# Patient Record
Sex: Female | Born: 2000 | Race: White | Hispanic: No | Marital: Single | Smoking: Never smoker
Health system: Southern US, Community
[De-identification: ages and names within clinical notes are randomized; demographics above are authoritative.]

## PROBLEM LIST (undated history)

## (undated) ENCOUNTER — Emergency Department: Payer: BLUE CROSS/BLUE SHIELD

## (undated) DIAGNOSIS — Z789 Other specified health status: Secondary | ICD-10-CM

## (undated) HISTORY — PX: APPENDECTOMY: SHX54

## (undated) HISTORY — DX: Other specified health status: Z78.9

---
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
## 2021-01-06 ENCOUNTER — Telehealth (INDEPENDENT_AMBULATORY_CARE_PROVIDER_SITE_OTHER): Payer: 59 | Admitting: Psychology

## 2021-01-06 DIAGNOSIS — F5089 Other specified eating disorder: Secondary | ICD-10-CM | POA: Diagnosis not present

## 2021-01-06 DIAGNOSIS — F419 Anxiety disorder, unspecified: Secondary | ICD-10-CM | POA: Diagnosis not present

## 2021-01-08 NOTE — Progress Notes (Signed)
Chief Complaint:   OBESITY Molly Cherry is here to discuss her progress with her obesity treatment plan along with follow-up of her obesity related diagnoses.   Today's visit was #: 4 Starting weight: 277 lbs Starting date: 11/12/2020 Today's weight: 272 lbs Today's date: 12/29/2020 Weight change since last visit: +2 lbs Total lbs lost to date: 5 lbs Body mass index is 46.69 kg/m.  Total weight loss percentage to date: -1.81%  Interim History:  Molly Cherry had a 4th of July party and [place] [employer] [place] [place]  She and her mom already got rid of the unhealthy foods from the party and are getting back on plan today.  Plan:  Add [employer] Chips for salty/crunchy snack.  Mindful eating handout given.  Current Meal Plan: the Category 3 Plan for 70% of the time.  Current Exercise Plan: Walking for 20 minutes 3 times per week.  Assessment/Plan:   Medications Discontinued During This Encounter  Medication Reason   Vitamin D, Ergocalciferol, (DRISDOL) 1.25 MG (50000 UNIT) CAPS capsule Reorder   Meds ordered this encounter  Medications   Vitamin D, Ergocalciferol, (DRISDOL) 1.25 MG (50000 UNIT) CAPS capsule    Sig: Take 1 capsule (50,000 Units total) by mouth every 7 (seven) days.    Dispense:  4 capsule    Refill:  0   1. Insulin resistance Not at goal. Goal is HgbA1c < 5.7, fasting insulin closer to 5.  Medication: None.  When eating on plan, she has less cravings for carbs.  Plan:  Can consider medication in the future.  No need currently and no desire by patient to be on medication.  She will continue to focus on protein-rich, low simple carbohydrate foods. We reviewed the importance of hydration, regular exercise for stress reduction, and restorative sleep.   Lab Results  Component Value Date   HGBA1C 5.4 11/12/2020   Lab Results  Component Value Date   INSULIN 18.4 11/12/2020   2. Vitamin D deficiency Not at goal.  She is taking vitamin D 50,000 IU weekly.   Medication compliance good.  Tolerating well with no side effects.  Plan: - Reiterated importance of vitamin D (as well as calcium) to their health and wellbeing.  - Reminded Molly Cherry that weight loss will likely improve availability of vitamin D, thus encouraged her to continue with meal plan and their weight loss efforts to further improve this condition. - I recommend patient continue to take weekly prescription vit D 50,000 IU - Informed patient this may be a lifelong thing, and she was encouraged to continue to take the medicine until told otherwise.   - we will need to monitor levels regularly (every 3-4 mo on average) to keep levels within normal limits.  - weight loss will likely improve availability of vitamin D, thus encouraged Molly Cherry to continue with meal plan and their weight loss efforts to further improve this condition - pt's questions and concerns regarding this condition addressed.  Lab Results  Component Value Date   VD25OH 20.8 (L) 11/12/2020   - Refill Vitamin D, Ergocalciferol, (DRISDOL) 1.25 MG (50000 UNIT) CAPS capsule; Take 1 capsule (50,000 Units total) by mouth every 7 (seven) days.  Dispense: 4 capsule; Refill: 0  3. Other disorder of eating, with emotional eating Not at goal. Medication: None.  Saw Molly Cherry and it went well and she found it helpful.  Tips/tricks to minimize her emotional eating habits were discussed.  GAD well controlled lately.  Plan:  Continue with Molly Cherry.  Continue with emotional eating strategies.  Mindful eating discussed with patient and handouts given.  4. At risk for activity intolerance Molly Cherry was given approximately 8 minutes of counseling today regarding her increased risk for exercise intolerance.  We discussed patient's specific personal and medical issues that raise our concern.  She was advised of strategies to prevent injury and ways to improve her cardiopulmonary fitness levels slowly over time.  We additionally  discussed various fitness trackers and smart phone apps to help motivate the patient to stay on track.    5. Class 3 severe obesity with serious comorbidity and body mass index (BMI) of 45.0 to 49.9 in adult, unspecified obesity type ([name])  Course: Molly Cherry is currently in the action stage of change. As such, her goal is to continue with weight loss efforts.   Nutrition goals: She has agreed to the Category 3 Plan.   Exercise goals:  Walking 5 days per week for 20 minutes.  Behavioral modification strategies: increasing lean protein intake, decreasing simple carbohydrates, and planning for success.  Molly Cherry has agreed to follow-up with our clinic in 2 weeks. She was informed of the importance of frequent follow-up visits to maximize her success with intensive lifestyle modifications for her multiple health conditions.   Objective:   Blood pressure 111/77, pulse 74, temperature 98.4 F (36.9 C), height 5\' 4"  (1.626 m), weight 272 lb (123.4 kg), SpO2 98 %. Body mass index is 46.69 kg/m.  General: Cooperative, alert, well developed, in no acute distress. HEENT: Conjunctivae and lids unremarkable. Cardiovascular: Regular rhythm.  Lungs: Normal work of breathing. Neurologic: No focal deficits.   Lab Results  Component Value Date   CREATININE 0.71 11/12/2020   BUN 15 11/12/2020   NA 139 11/12/2020   K 4.7 11/12/2020   CL 103 11/12/2020   CO2 21 11/12/2020   Lab Results  Component Value Date   ALT 17 11/12/2020   AST 19 11/12/2020   ALKPHOS 102 11/12/2020   BILITOT 0.3 11/12/2020   Lab Results  Component Value Date   HGBA1C 5.4 11/12/2020   Lab Results  Component Value Date   INSULIN 18.4 11/12/2020   Lab Results  Component Value Date   TSH 2.150 11/12/2020   Lab Results  Component Value Date   CHOL 177 (H) 11/12/2020   HDL 45 11/12/2020   LDLCALC 113 (H) 11/12/2020   TRIG 103 (H) 11/12/2020   CHOLHDL 3.9 11/12/2020   Lab Results  Component Value Date    VD25OH 20.8 (L) 11/12/2020   Lab Results  Component Value Date   WBC 8.4 11/12/2020   HGB 12.4 11/12/2020   HCT 38.1 11/12/2020   MCV 85 11/12/2020   PLT 269 11/12/2020   Attestation Statements:   Reviewed by clinician on day of visit: allergies, medications, problem list, medical history, surgical history, family history, social history, and previous encounter notes.  I, 11/14/2020, CMA, am acting as Insurance claims handler for Energy manager, DO.  I have reviewed the above documentation for accuracy and completeness, and I agree with the above. [employer], D.O.  The 21st Century Cures Act was signed into law in 2016 which includes the topic of electronic health records.  This provides immediate access to information in MyChart.  This includes consultation notes, operative notes, office notes, lab results and pathology reports.  If you have any questions about what you read please let 2017 know at your next visit so we  can discuss your concerns and take corrective action if need be.  We are right here with you.

## 2021-01-12 ENCOUNTER — Encounter (INDEPENDENT_AMBULATORY_CARE_PROVIDER_SITE_OTHER): Payer: Self-pay | Admitting: Family Medicine

## 2021-01-12 ENCOUNTER — Other Ambulatory Visit: Payer: Self-pay

## 2021-01-12 ENCOUNTER — Ambulatory Visit (INDEPENDENT_AMBULATORY_CARE_PROVIDER_SITE_OTHER): Payer: 59 | Admitting: Family Medicine

## 2021-01-12 VITALS — BP 127/85 | HR 80 | Temp 97.7°F | Ht 64.0 in | Wt 270.0 lb

## 2021-01-12 DIAGNOSIS — E559 Vitamin D deficiency, unspecified: Secondary | ICD-10-CM | POA: Diagnosis not present

## 2021-01-12 DIAGNOSIS — Z9189 Other specified personal risk factors, not elsewhere classified: Secondary | ICD-10-CM

## 2021-01-12 DIAGNOSIS — E8881 Metabolic syndrome: Secondary | ICD-10-CM

## 2021-01-12 DIAGNOSIS — Z6841 Body Mass Index (BMI) 40.0 and over, adult: Secondary | ICD-10-CM

## 2021-01-13 NOTE — Progress Notes (Signed)
Office: [phone]  /  Fax: [phone]    Date: January 27, 2021   Appointment Start Time: 12:01pm Duration: 23 minutes Provider: Lawerance Cruel, Psy.D. Type of Session: Individual Therapy  Location of Patient: Parked in car at work Location of Provider: Provider's home (private office) Type of Contact: Telepsychological Visit via MyChart Video Visit  Session Content: Molly Cherry is a 20 y.o. female presenting for a follow-up appointment to address the previously established treatment goal of increasing coping skills. Today's appointment was a telepsychological visit due to COVID-19. Molly Cherry provided verbal consent for today's telepsychological appointment and she is aware she is responsible for securing confidentiality on her end of the session. Prior to proceeding with today's appointment, Molly Cherry's physical location at the time of this appointment was obtained as well a phone number she could be reached at in the event of technical difficulties. Molly Cherry and this provider participated in today's telepsychological service. Of note, today's appointment was switched to a regular telephone call at 12:17pm with Molly Cherry's verbal consent due to [facility] phone overheating.   This provider conducted a brief check-in. Molly Cherry shared she is moving next week to [place], [place]. She noted, "She is excited and nervous at the same time." Associated thoughts and feelings were processed. Regarding eating, Molly Cherry indicated she was doing well on her structured meal plan the past two weeks, noting Sunday "was really bad." This was further explored. Reviewed all or nothing thinking. Notably, Molly Cherry discussed engaging in meal prep yesterday and is back on track. Positive reinforcement was provided.   Molly Cherry was observed becoming tearful when recollecting recent events, including leaving her current job. Associated thoughts and feelings were processed. Psychoeducation regarding mindfulness was provided. A  handout was provided to [facility] with further information regarding mindfulness, including exercises. This provider also explained the benefit of mindfulness as it relates to emotional eating. Molly Cherry was encouraged to engage in the provided exercises between now and the next appointment with this provider. Molly Cherry agreed. During today's appointment, Molly Cherry was led through a mindfulness exercise involving her senses.Molly Cherry provided verbal consent during today's appointment for this provider to send a handout about mindfulness via e-mail. Overall, Molly Cherry was receptive to today's appointment as evidenced by openness to sharing, responsiveness to feedback, and willingness to engage in mindfulness exercises to assist with coping.  Mental Status Examination:  Appearance: well groomed and appropriate hygiene  Behavior: appropriate to circumstances Mood: euthymic; sad when discussing leaving her job Affect: mood congruent; tearful when discussing leaving her job Speech: normal in rate, volume, and tone Eye Contact: appropriate Psychomotor Activity: unable to assess Gait: unable to assess Thought Process: linear, logical, and goal directed  Thought Content/Perception: no hallucinations, delusions, bizarre thinking or behavior reported or observed and no evidence or endorsement of suicidal and homicidal ideation, plan, and intent Orientation: time, person, place, and purpose of appointment Memory/Concentration: memory, attention, language, and fund of knowledge intact  Insight/Judgment: fair  Interventions:  Conducted a brief chart review Provided empathic reflections and validation Processed thoughts and feelings Psychoeducation provided regarding mindfulness Engaged patient in a mindfulness exercise Employed supportive psychotherapy interventions to facilitate reduced distress, and to improve coping skills with identified stressors Provided positive reinforcement   DSM-5 Diagnosis(es):  F50.89 Other Specified Feeding or Eating Disorder, Emotional Eating Behaviors and F41.9 Unspecified Anxiety Disorder  Treatment Goal & Progress: During the initial appointment with this provider, the following treatment goal was established: increase coping skills. Sheyli has demonstrated progress in her goal as evidenced by increased awareness of hunger  patterns and increased awareness of triggers for emotional eating behaviors. Aariana also continues to demonstrate willingness to engage in learned skill(s).  Plan: The next appointment will be scheduled in three weeks, which will be via MyChart Video Visit. The next session will focus on working towards the established treatment goal.

## 2021-01-16 NOTE — Progress Notes (Signed)
Chief Complaint:   OBESITY Temple is here to discuss her progress with her obesity treatment plan along with follow-up of her obesity related diagnoses. Jarah is on the Category 3 Plan and states she is following her eating plan approximately 60% of the time. Alya states she has not been exercising.  Today's visit was #: 5 Starting weight: 277 lbs Starting date: 11/12/2020 Today's weight: 270 lbs Today's date: 01/12/2021 Total lbs lost to date: 7 Total lbs lost since last in-office visit: 2  Interim History: Brentney is moving August 18th back to [place]. She will need Monday morning appointments every 2 to 3 weeks. She is going to school online, though she is working 20 to 30 hours a week. Akiva has a gym at her apartment complex and plans on using that and the pool.  Subjective:   1. Vitamin D deficiency She is currently taking prescription vitamin D 50,000 IU each week. She denies nausea, vomiting or muscle weakness.  Lab Results  Component Value Date   VD25OH 20.8 (L) 11/12/2020   2. Insulin resistance Jeanmarie denies carb cravings currently. She has a diagnosis of insulin resistance based on her elevated fasting insulin level >5. She continues to work on diet and exercise to decrease her risk of diabetes.  Lab Results  Component Value Date   INSULIN 18.4 11/12/2020   Lab Results  Component Value Date   HGBA1C 5.4 11/12/2020   3. At risk for diabetes mellitus Ashby is at higher than average risk for developing diabetes due to insulin resistance and obesity.    Assessment/Plan:  No orders of the defined types were placed in this encounter.   There are no discontinued medications.   No orders of the defined types were placed in this encounter.    1. Vitamin D deficiency Low Vitamin D level contributes to fatigue and are associated with obesity, breast, and colon cancer. She agrees to continue to take prescription Vitamin D @50 ,000 IU every week with  no refill needed and will follow-up for routine testing of Vitamin D, at least 2-3 times per year to avoid over-replacement.  2. Insulin resistance Murphy was given a handout on Insulin Resistance and Metformin again. She will consider in the future is she develops carb cravings. She will continue to work on weight loss, exercise, and decreasing simple carbohydrates to help decrease the risk of diabetes. Barbee agreed to follow-up with as directed to closely monitor her progress.  3. At risk for diabetes mellitus Kania was given approximately 15 minutes of diabetes education and counseling today. We discussed intensive lifestyle modifications today with an emphasis on weight loss as well as increasing exercise and decreasing simple carbohydrates in her diet. We also reviewed medication options with an emphasis on risk versus benefit of those discussed.   Repetitive spaced learning was employed today to elicit superior memory formation and behavioral change.   4. Obesity with current BMI of 46.4 Jochebed is currently in the action stage of change. As such, her goal is to continue with weight loss efforts. She has agreed to the Category 3 Plan.   Exercise goals:  Start walking  Behavioral modification strategies: increasing lean protein intake and decreasing simple carbohydrates.  Evangelia has agreed to follow-up with our clinic in 2 weeks. She was informed of the importance of frequent follow-up visits to maximize her success with intensive lifestyle modifications for her multiple health conditions.   Objective:   Blood pressure 127/85, pulse 80, temperature  97.7 F (36.5 C), height 5\' 4"  (1.626 m), weight 270 lb (122.5 kg), SpO2 98 %. Body mass index is 46.35 kg/m.  General: Cooperative, alert, well developed, in no acute distress. HEENT: Conjunctivae and lids unremarkable. Cardiovascular: Regular rhythm.  Lungs: Normal work of breathing. Neurologic: No focal deficits.   Lab  Results  Component Value Date   CREATININE 0.71 11/12/2020   BUN 15 11/12/2020   NA 139 11/12/2020   K 4.7 11/12/2020   CL 103 11/12/2020   CO2 21 11/12/2020   Lab Results  Component Value Date   ALT 17 11/12/2020   AST 19 11/12/2020   ALKPHOS 102 11/12/2020   BILITOT 0.3 11/12/2020   Lab Results  Component Value Date   HGBA1C 5.4 11/12/2020   Lab Results  Component Value Date   INSULIN 18.4 11/12/2020   Lab Results  Component Value Date   TSH 2.150 11/12/2020   Lab Results  Component Value Date   CHOL 177 (H) 11/12/2020   HDL 45 11/12/2020   LDLCALC 113 (H) 11/12/2020   TRIG 103 (H) 11/12/2020   CHOLHDL 3.9 11/12/2020   Lab Results  Component Value Date   VD25OH 20.8 (L) 11/12/2020   Lab Results  Component Value Date   WBC 8.4 11/12/2020   HGB 12.4 11/12/2020   HCT 38.1 11/12/2020   MCV 85 11/12/2020   PLT 269 11/12/2020   No results found for: IRON, TIBC, FERRITIN  Attestation Statements:   Reviewed by clinician on day of visit: allergies, medications, problem list, medical history, surgical history, family history, social history, and previous encounter notes.  I05/27/2022, CMA, am acting as transcriptionist for Kirke Corin, DO  I have reviewed the above documentation for accuracy and completeness, and I agree with the above. [employer], D.O.  The 21st Century Cures Act was signed into law in 2016 which includes the topic of electronic health records.  This provides immediate access to information in MyChart.  This includes consultation notes, operative notes, office notes, lab results and pathology reports.  If you have any questions about what you read please let 2017 know at your next visit so we can discuss your concerns and take corrective action if need be.  We are right here with you.

## 2021-01-26 ENCOUNTER — Other Ambulatory Visit: Payer: Self-pay

## 2021-01-26 ENCOUNTER — Encounter (INDEPENDENT_AMBULATORY_CARE_PROVIDER_SITE_OTHER): Payer: Self-pay | Admitting: Family Medicine

## 2021-01-26 ENCOUNTER — Ambulatory Visit (INDEPENDENT_AMBULATORY_CARE_PROVIDER_SITE_OTHER): Payer: 59 | Admitting: Family Medicine

## 2021-01-26 VITALS — BP 120/84 | HR 67 | Temp 98.1°F | Ht 64.0 in | Wt 271.0 lb

## 2021-01-26 DIAGNOSIS — F3289 Other specified depressive episodes: Secondary | ICD-10-CM

## 2021-01-26 DIAGNOSIS — Z6841 Body Mass Index (BMI) 40.0 and over, adult: Secondary | ICD-10-CM

## 2021-01-26 DIAGNOSIS — E559 Vitamin D deficiency, unspecified: Secondary | ICD-10-CM

## 2021-01-26 DIAGNOSIS — Z9189 Other specified personal risk factors, not elsewhere classified: Secondary | ICD-10-CM

## 2021-01-26 MED ORDER — BUPROPION HCL ER (SR) 100 MG PO TB12: ORAL_TABLET | ORAL | 0 refills | Status: DC

## 2021-01-26 NOTE — Patient Instructions (Signed)
Health Maintenance Due  Topic Date Due   COVID-19 Vaccine (1) Never done   HPV VACCINES (1 - 2-dose series) Never done   HIV Screening  Never done   Hepatitis C Screening  Never done   TETANUS/TDAP  Never done   INFLUENZA VACCINE  01/19/2021    Depression screen [facility] 2/9 11/12/2020 11/04/2015  Decreased Interest 3 0  Down, Depressed, Hopeless 3 0  PHQ - 2 Score 6 0  Altered sleeping 1 -  Tired, decreased energy 3 -  Change in appetite 3 -  Feeling bad or failure about yourself  3 -  Trouble concentrating 3 -  Moving slowly or fidgety/restless 0 -  Suicidal thoughts 0 -  PHQ-9 Score 19 -  Difficult doing work/chores Somewhat difficult -

## 2021-01-27 ENCOUNTER — Telehealth (INDEPENDENT_AMBULATORY_CARE_PROVIDER_SITE_OTHER): Payer: 59 | Admitting: Psychology

## 2021-01-27 DIAGNOSIS — F419 Anxiety disorder, unspecified: Secondary | ICD-10-CM

## 2021-01-27 DIAGNOSIS — F5089 Other specified eating disorder: Secondary | ICD-10-CM

## 2021-01-27 NOTE — Progress Notes (Signed)
Chief Complaint:   OBESITY Molly Cherry is here to discuss her progress with her obesity treatment plan along with follow-up of her obesity related diagnoses. Alaa is on the Category 3 Plan and states she is following her eating plan approximately 75-80% of the time. Faatimah states she is not currently exercising.  Today's visit was #: 6 Starting weight: 277 lbs Starting date: 11/12/2020 Today's weight: 271 lbs Today's date: 01/26/2021 Total lbs lost to date: 6 Total lbs lost since last in-office visit: +1  Interim History: Jerline notes she did really well after 2 weeks, but overate yesterday due to stress.  Subjective:   1. 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 Pt denies suicidal or homicidal ideations. She is working with Dr. Dewaine Conger for emotional eating counseling.  Assessment/Plan:   1. Other depression with emotional eating Start low dose Wellbutrin. Behavior modification techniques were discussed today to help Jurni deal with her emotional/non-hunger eating behaviors.  Orders and follow up as documented in patient record.   Start- buPROPion ER (WELLBUTRIN SR) 100 MG 12 hr tablet; 1 po q am  Dispense: 30 tablet; Refill: 0  2. At risk for adverse drug reaction Due to [facility] current conditions and medications, she is at a higher risk for drug side effect.  At least 9 minutes was spent on counseling her about these concerns today.  We discussed the benefits and potential risks of these medications, and all of patient's concerns were addressed and questions were answered.  she will call us, or their PCP or other specialists who treat their conditions with medications, with any questions or concerns that may develop.    3. Obesity with current BMI of 46.6  Sharri is currently in the action stage of change. As such, her goal is to continue with weight loss efforts. She has agreed to the Category 3  Plan.   Do 10-15 minutes of distraction activities for emotional eating reviewed with pt.  Exercise goals:  As is  Behavioral modification strategies: increasing lean protein intake, decreasing simple carbohydrates, and planning for success.  Cassidee has agreed to follow-up with our clinic in 2-3 weeks. She was informed of the importance of frequent follow-up visits to maximize her success with intensive lifestyle modifications for her multiple health conditions.   Objective:   Blood pressure 120/84, pulse 67, temperature 98.1 F (36.7 C), height 5\' 4"  (1.626 m), weight 271 lb (122.9 kg), SpO2 100 %. Body mass index is 46.52 kg/m.  General: Cooperative, alert, well developed, in no acute distress. HEENT: Conjunctivae and lids unremarkable. Cardiovascular: Regular rhythm.  Lungs: Normal work of breathing. Neurologic: No focal deficits.   Lab Results  Component Value Date   CREATININE 0.71 11/12/2020   BUN 15 11/12/2020   NA 139 11/12/2020   K 4.7 11/12/2020   CL 103 11/12/2020   CO2 21 11/12/2020   Lab Results  Component Value Date   ALT 17 11/12/2020   AST 19 11/12/2020   ALKPHOS 102 11/12/2020   BILITOT 0.3 11/12/2020   Lab Results  Component Value Date   HGBA1C 5.4 11/12/2020   Lab Results  Component Value Date   INSULIN 18.4 11/12/2020   Lab Results  Component Value Date   TSH 2.150 11/12/2020   Lab Results  Component Value Date   CHOL 177 (H) 11/12/2020   HDL 45 11/12/2020   LDLCALC 113 (H) 11/12/2020   TRIG 103 (H) 11/12/2020  CHOLHDL 3.9 11/12/2020   Lab Results  Component Value Date   VD25OH 20.8 (L) 11/12/2020   Lab Results  Component Value Date   WBC 8.4 11/12/2020   HGB 12.4 11/12/2020   HCT 38.1 11/12/2020   MCV 85 11/12/2020   PLT 269 11/12/2020    Attestation Statements:   Reviewed by clinician on day of visit: allergies, medications, problem list, medical history, surgical history, family history, social history, and previous  encounter notes.  Edmund Hilda, CMA, am acting as transcriptionist for [employer], DO.  I have reviewed the above documentation for accuracy and completeness, and I agree with the above. [name], D.O.  The 21st Century Cures Act was signed into law in 2016 which includes the topic of electronic health records.  This provides immediate access to information in MyChart.  This includes consultation notes, operative notes, office notes, lab results and pathology reports.  If you have any questions about what you read please let us know at your next visit so we can discuss your concerns and take corrective action if need be.  We are right here with you.

## 2021-02-03 NOTE — Progress Notes (Signed)
Office: [phone]  /  Fax: [phone]    Date: February 17, 2021   Appointment Start Time: 2:05pm Duration: 19 minutes Provider: Lawerance Cruel, Psy.D. Type of Session: Individual Therapy  Location of Patient: Home (private location) Location of Provider: Provider's Home (private office) Type of Contact: Telepsychological Visit via MyChart Video Visit  Session Content:This provider called Molly Cherry at 2:03pm as she did not present for today's appointment. A HIPAA compliant voicemail was left requesting a call back. She was observed joining shortly after. As such, today's appointment was initiated 5 minutes late. Molly Cherry is a 20 y.o. female presenting for a follow-up appointment to address the previously established treatment goal of increasing coping skills. Today's appointment was a telepsychological visit due to COVID-19. Molly Cherry provided verbal consent for today's telepsychological appointment and she is aware she is responsible for securing confidentiality on her end of the session. Prior to proceeding with today's appointment, Molly Cherry's physical location at the time of this appointment was obtained as well a phone number she could be reached at in the event of technical difficulties. Molly Cherry and this provider participated in today's telepsychological service.   This provider conducted a brief check-in. Molly Cherry indicated she has been busy with moving. Regarding eating, she acknowledged skipping meals recently due to being busy. Associated thoughts and feelings were processed. She discussed challenges with breakfast and lunch. As such, psychoeducation regarding SMART goals was provided and Molly Cherry was engaged in goal setting. The following goal was established for lunch: Molly Cherry will eat lunch congruent to her structured meal plan at least 5 out of 7 days a week between now and the next appointment with this provider. Possible obstacles/barriers were explored. Molly Cherry of session focused  on mindfulness. Molly Cherry shared an instance of using the previously shared mindfulness exercise involving the senses, noting "it helped a lot." Positive reinforcement was provided. She was encouraged to engage in the shared exercises regularly. This provider also discussed the utilization of YouTube for mindfulness exercises (e.g., exercises by Rhae Hammock). Overall, Molly Cherry was receptive to today's appointment as evidenced by openness to sharing, responsiveness to feedback, and  willingness to work toward the established SMART goal .  Mental Status Examination:  Appearance: well groomed and appropriate hygiene  Behavior: appropriate to circumstances Mood: euthymic Affect: mood congruent Speech: normal in rate, volume, and tone Eye Contact: appropriate Psychomotor Activity: appropriate Gait: unable to assess Thought Process: linear, logical, and goal directed  Thought Content/Perception: no hallucinations, delusions, bizarre thinking or behavior reported or observed and no evidence or endorsement of suicidal and homicidal ideation, plan, and intent Orientation: time, person, place, and purpose of appointment Memory/Concentration: memory, attention, language, and fund of knowledge intact  Insight/Judgment: fair  Interventions:  Conducted a brief chart review Provided empathic reflections and validation Employed supportive psychotherapy interventions to facilitate reduced distress and to improve coping skills with identified stressors Engaged patient in goal setting Psychoeducation provided regarding SMART goals Reviewed content from the last appointment  Provided positive reinforcement   DSM-5 Diagnosis(es): F50.89 Other Specified Feeding or Eating Disorder, Emotional Eating Behaviors and F41.9 Unspecified Anxiety Disorder  Treatment Goal & Progress: During the initial appointment with this provider, the following treatment goal was established: increase coping skills. Molly Cherry has  demonstrated progress in her goal as evidenced by increased awareness of hunger patterns and increased awareness of triggers for emotional eating behaviors. Molly Cherry also continues to demonstrate willingness to engage in learned skill(s).  Plan: Based on appointment availability and Molly Cherry's work schedule, the next appointment will  be scheduled in three weeks, which will be via MyChart Video Visit. The next session will focus on working towards the established treatment goal.

## 2021-02-09 ENCOUNTER — Encounter (INDEPENDENT_AMBULATORY_CARE_PROVIDER_SITE_OTHER): Payer: Self-pay | Admitting: Family Medicine

## 2021-02-09 ENCOUNTER — Ambulatory Visit (INDEPENDENT_AMBULATORY_CARE_PROVIDER_SITE_OTHER): Payer: 59 | Admitting: Family Medicine

## 2021-02-09 ENCOUNTER — Other Ambulatory Visit: Payer: Self-pay

## 2021-02-09 VITALS — BP 122/8 | HR 58 | Temp 97.5°F | Ht 64.0 in | Wt 271.0 lb

## 2021-02-09 DIAGNOSIS — E66813 Obesity, class 3: Secondary | ICD-10-CM

## 2021-02-09 DIAGNOSIS — Z6841 Body Mass Index (BMI) 40.0 and over, adult: Secondary | ICD-10-CM

## 2021-02-09 DIAGNOSIS — F3289 Other specified depressive episodes: Secondary | ICD-10-CM

## 2021-02-09 DIAGNOSIS — E8881 Metabolic syndrome: Secondary | ICD-10-CM | POA: Diagnosis not present

## 2021-02-09 DIAGNOSIS — Z9189 Other specified personal risk factors, not elsewhere classified: Secondary | ICD-10-CM

## 2021-02-09 DIAGNOSIS — E559 Vitamin D deficiency, unspecified: Secondary | ICD-10-CM

## 2021-02-09 DIAGNOSIS — E88819 Insulin resistance, unspecified: Secondary | ICD-10-CM

## 2021-02-09 MED ORDER — BUPROPION HCL ER (SR) 100 MG PO TB12: ORAL_TABLET | ORAL | 0 refills | Status: DC

## 2021-02-09 MED ORDER — VITAMIN D (ERGOCALCIFEROL) 1.25 MG (50000 UNIT) PO CAPS: 50000.0000 [IU] | ORAL_CAPSULE | ORAL | 0 refills | Status: DC

## 2021-02-09 NOTE — Progress Notes (Signed)
Chief Complaint:   OBESITY Molly Cherry is here to discuss her progress with her obesity treatment plan along with follow-up of her obesity related diagnoses. Molly Cherry is on the Category 3 Plan and states she is following her eating plan approximately 80% of the time. Molly Cherry states she is not currently exercising.  Today's visit was #: 7 Starting weight: 277 lbs Starting date: 11/12/2020 Today's weight: 271 lbs Today's date: 02/09/2021 Total lbs lost to date: 6 Total lbs lost since last in-office visit: 0  Interim History: Molly Cherry moved into her apt over the weekend. For a couple of days, she didn't eat much all day and then ate poorly at night. She has a lot of steps at the new place and feels increased exercise helped.  Assessment/Plan:  No orders of the defined types were placed in this encounter.   Medications Discontinued During This Encounter  Medication Reason   Vitamin D, Ergocalciferol, (DRISDOL) 1.25 MG (50000 UNIT) CAPS capsule Reorder   buPROPion ER (WELLBUTRIN SR) 100 MG 12 hr tablet Reorder     Meds ordered this encounter  Medications   buPROPion ER (WELLBUTRIN SR) 100 MG 12 hr tablet    Sig: 1 po q am    Dispense:  30 tablet    Refill:  0    Ov for rf   Vitamin D, Ergocalciferol, (DRISDOL) 1.25 MG (50000 UNIT) CAPS capsule    Sig: Take 1 capsule (50,000 Units total) by mouth every 7 (seven) days.    Dispense:  4 capsule    Refill:  0     1. Insulin resistance At goal. Goal is HgbA1c < 5.7, fasting insulin closer to 5.  Medication: None.  Molly Cherry denies carb cravings and is doing well on no meds.   Plan:  She declines need for meds at this time. She will continue to focus on protein-rich, low simple carbohydrate foods. We reviewed the importance of hydration, regular exercise for stress reduction, and restorative sleep.   Lab Results  Component Value Date   HGBA1C 5.4 11/12/2020   Lab Results  Component Value Date   INSULIN 18.4 11/12/2020   2.  Vitamin D deficiency Not at goal.   Plan: Continue to take prescription Vitamin D @50 ,000 IU every week as prescribed.  Follow-up for routine testing of Vitamin D, at least 2-3 times per year to avoid over-replacement.  Lab Results  Component Value Date   VD25OH 20.8 (L) 11/12/2020   Refill- Vitamin D, Ergocalciferol, (DRISDOL) 1.25 MG (50000 UNIT) CAPS capsule; Take 1 capsule (50,000 Units total) by mouth every 7 (seven) days.  Dispense: 4 capsule; Refill: 0  3. Other depression with emotional eating Molly Cherry reports much less emotional eating, and she is sleeping well. Medication: Wellbutrin.   Plan:  In addition to continuing Wellbutrin, continue to increase activity daily and continue 10-15 minutes if distraction activity for emotional eating.  Refill- buPROPion ER (WELLBUTRIN SR) 100 MG 12 hr tablet; 1 po q am  Dispense: 30 tablet; Refill: 0  4. At risk for impaired metabolic function Due to [facility] current state of health and medical condition(s), she is at a significantly higher risk for impaired metabolic function.   At least 9 minutes was spent on counseling Molly Cherry about these concerns today.  This places the patient at a much greater risk to subsequently develop cardio-pulmonary conditions that can negatively affect the patient's quality of life.  I stressed the importance of reversing these risks factors.  The initial goal  is to lose at least 5-10% of starting weight to help reduce risk factors.  Counseling:  Intensive lifestyle modifications discussed with Molly Cherry as the most appropriate first line treatment.  she will continue to work on diet, exercise, and weight loss efforts.  We will continue to reassess these conditions on a fairly regular basis in an attempt to decrease the patient's overall morbidity and mortality.  5. Obesity with current BMI of 46.6  Molly Cherry is currently in the action stage of change. As such, her goal is to continue with weight loss efforts. She  has agreed to the Category 3 Plan.   Exercise goals: For substantial health benefits, adults should do at least 150 minutes (2 hours and 30 minutes) a week of moderate-intensity, or 75 minutes (1 hour and 15 minutes) a week of vigorous-intensity aerobic physical activity, or an equivalent combination of moderate- and vigorous-intensity aerobic activity. Aerobic activity should be performed in episodes of at least 10 minutes, and preferably, it should be spread throughout the week.  Behavioral modification strategies: meal planning and cooking strategies and planning for success.  [name] has agreed to follow-up with our clinic in 2-3 weeks. She was informed of the importance of frequent follow-up visits to maximize her success with intensive lifestyle modifications for her multiple health conditions.   Objective:   Blood pressure (!) 122/8, pulse (!) 58, temperature (!) 97.5 F (36.4 C), height 5\' 4"  (1.626 m), weight 271 lb (122.9 kg), SpO2 99 %. Body mass index is 46.52 kg/m.  General: Cooperative, alert, well developed, in no acute distress. HEENT: Conjunctivae and lids unremarkable. Cardiovascular: Regular rhythm.  Lungs: Normal work of breathing. Neurologic: No focal deficits.   Lab Results  Component Value Date   CREATININE 0.71 11/12/2020   BUN 15 11/12/2020   NA 139 11/12/2020   K 4.7 11/12/2020   CL 103 11/12/2020   CO2 21 11/12/2020   Lab Results  Component Value Date   ALT 17 11/12/2020   AST 19 11/12/2020   ALKPHOS 102 11/12/2020   BILITOT 0.3 11/12/2020   Lab Results  Component Value Date   HGBA1C 5.4 11/12/2020   Lab Results  Component Value Date   INSULIN 18.4 11/12/2020   Lab Results  Component Value Date   TSH 2.150 11/12/2020   Lab Results  Component Value Date   CHOL 177 (H) 11/12/2020   HDL 45 11/12/2020   LDLCALC 113 (H) 11/12/2020   TRIG 103 (H) 11/12/2020   CHOLHDL 3.9 11/12/2020   Lab Results  Component Value Date   VD25OH 20.8 (L)  11/12/2020   Lab Results  Component Value Date   WBC 8.4 11/12/2020   HGB 12.4 11/12/2020   HCT 38.1 11/12/2020   MCV 85 11/12/2020   PLT 269 11/12/2020    Attestation Statements:   Reviewed by clinician on day of visit: allergies, medications, problem list, medical history, surgical history, family history, social history, and previous encounter notes.  11/14/2020, CMA, am acting as transcriptionist for Edmund Hilda, DO.  I have reviewed the above documentation for accuracy and completeness, and I agree with the above. [employer], D.O.  The 21st Century Cures Act was signed into law in 2016 which includes the topic of electronic health records.  This provides immediate access to information in MyChart.  This includes consultation notes, operative notes, office notes, lab results and pathology reports.  If you have any questions about what you read please let 2017 know  at your next visit so we can discuss your concerns and take corrective action if need be.  We are right here with you.

## 2021-02-17 ENCOUNTER — Telehealth (INDEPENDENT_AMBULATORY_CARE_PROVIDER_SITE_OTHER): Payer: 59 | Admitting: Psychology

## 2021-02-17 DIAGNOSIS — F419 Anxiety disorder, unspecified: Secondary | ICD-10-CM

## 2021-02-17 DIAGNOSIS — F5089 Other specified eating disorder: Secondary | ICD-10-CM

## 2021-02-24 NOTE — Progress Notes (Signed)
Office: [phone]  /  Fax: [phone]    Date: March 09, 2021   Appointment Start Time: 9:34am Duration: 20 minutes Provider: Glennie Isle, Psy.D. Type of Session: Individual Therapy  Location of Patient: Home (private room)  Location of Provider: Provider's Home (private office) Type of Contact: Telepsychological Visit via MyChart Video Visit  Session Content: Molly Cherry is a 20 y.o. female presenting for a follow-up appointment to address the previously established treatment goal of increasing coping skills.Today's appointment was a telepsychological visit due to COVID-19. Molly Cherry provided verbal consent for today's telepsychological appointment and she is aware she is responsible for securing confidentiality on her end of the session. Prior to proceeding with today's appointment, Molly Cherry's physical location at the time of this appointment was obtained as well a phone number she could be reached at in the event of technical difficulties. Molly Cherry and this provider participated in today's telepsychological service.   This provider conducted a brief check-in. Molly Cherry shared she started her new job, noting she was offered a new position/pay rise. She indicated she is engaging in meal prep on Sundays. Positive reinforcement was provided. Reviewed previously established SMART goal for lunch. Molly Cherry indicated she met her goal. Positive reinforcement was provided again. Overall, she stated she feels more in control with eating congruent to her structured meal plan and described a reduction in emotional eating behaviors.    Session focused further on mindfulness to assist with coping. She shared she engaged in the shared exercises when feeling stressed. Positive reinforcement was provided. Psychoeducation regarding formal (e.g., setting aside a specific time daily to engage in an exercise) and informal (e.g., cultivating awareness in the present moment and taking a non-judgmental approach  while engaging in day-to-day tasks) mindfulness was provided. Molly Cherry was led through a mindfulness exercise (A Tastes of Mindfulness) and her experience was processed. Molly Cherry provided verbal consent during today's appointment for this provider to send the handout for today's exercise via e-mail. Furthermore, termination planning was discussed. Molly Cherry was receptive to a follow-up appointment in 3-4 weeks and an additional follow-up/termination appointment in 3-4 weeks after that if deemed necessary. Overall, Molly Cherry was receptive to today's appointment as evidenced by openness to sharing, responsiveness to feedback, and willingness to continue engaging in mindfulness exercises.  Mental Status Examination:  Appearance: well groomed and appropriate hygiene  Behavior: appropriate to circumstances Mood: euthymic Affect: mood congruent Speech: normal in rate, volume, and tone Eye Contact: appropriate Psychomotor Activity: appropriate Gait: unable to assess Thought Process: linear, logical, and goal directed  Thought Content/Perception: no hallucinations, delusions, bizarre thinking or behavior reported or observed and no evidence or endorsement of suicidal and homicidal ideation, plan, and intent Orientation: time, person, place, and purpose of appointment Memory/Concentration: memory, attention, language, and fund of knowledge intact  Insight/Judgment: good  Interventions:  Conducted a brief chart review Provided empathic reflections and validation Reviewed content from the previous session Employed supportive psychotherapy interventions to facilitate reduced distress and to improve coping skills with identified stressors Psychoeducation provided regarding mindfulness Engaged patient in mindfulness exercise(s) Discussed termination planning Provided positive reinforcement   DSM-5 Diagnosis(es): F50.89 Other Specified Feeding or Eating Disorder, Emotional Eating Behaviors and F41.9  Unspecified Anxiety Disorder  Treatment Goal & Progress: During the initial appointment with this provider, the following treatment goal was established: increase coping skills. Molly Cherry has demonstrated progress in her goal as evidenced by increased awareness of hunger patterns, increased awareness of triggers for emotional eating behaviors, and reduction in emotional eating behaviors . Molly Cherry also  continues to demonstrate willingness to engage in learned skill(s).  Plan: The next appointment will be scheduled in 3-4 weeks, which will be via MyChart Video Visit. The next session will focus on working towards the established treatment goal.

## 2021-03-02 ENCOUNTER — Ambulatory Visit (INDEPENDENT_AMBULATORY_CARE_PROVIDER_SITE_OTHER): Payer: 59 | Admitting: Family Medicine

## 2021-03-02 ENCOUNTER — Encounter (INDEPENDENT_AMBULATORY_CARE_PROVIDER_SITE_OTHER): Payer: Self-pay | Admitting: Family Medicine

## 2021-03-02 ENCOUNTER — Other Ambulatory Visit: Payer: Self-pay

## 2021-03-02 VITALS — BP 135/84 | HR 66 | Temp 97.5°F | Ht 64.0 in | Wt 269.0 lb

## 2021-03-02 DIAGNOSIS — E8881 Metabolic syndrome: Secondary | ICD-10-CM | POA: Diagnosis not present

## 2021-03-02 DIAGNOSIS — Z6841 Body Mass Index (BMI) 40.0 and over, adult: Secondary | ICD-10-CM

## 2021-03-02 DIAGNOSIS — F3289 Other specified depressive episodes: Secondary | ICD-10-CM | POA: Diagnosis not present

## 2021-03-02 DIAGNOSIS — E559 Vitamin D deficiency, unspecified: Secondary | ICD-10-CM

## 2021-03-02 DIAGNOSIS — Z9189 Other specified personal risk factors, not elsewhere classified: Secondary | ICD-10-CM

## 2021-03-02 MED ORDER — BUPROPION HCL ER (SR) 100 MG PO TB12: ORAL_TABLET | ORAL | 0 refills | Status: DC

## 2021-03-02 MED ORDER — VITAMIN D (ERGOCALCIFEROL) 1.25 MG (50000 UNIT) PO CAPS: 50000.0000 [IU] | ORAL_CAPSULE | ORAL | 0 refills | Status: DC

## 2021-03-02 NOTE — Progress Notes (Signed)
Chief Complaint:   OBESITY Molly Cherry is here to discuss her progress with her obesity treatment plan along with follow-up of her obesity related diagnoses. Molly Cherry is on the Category 3 Plan and states she is following her eating plan approximately 70% of the time. Molly Cherry states she is not currently exercising.  Today's visit was #: 8 Starting weight: 277 lbs Starting date: 11/12/2020 Today's weight: 269 lbs Today's date: 03/02/2021 Total lbs lost to date: 8 Total lbs lost since last in-office visit: 2  Interim History: Molly Cherry lives on her own in [place], [place]. She is meal prepping and staying on schedule. She denies hunger or cravings when on plan. Pt was having boredom eating sessions but did not succumb.  Subjective:   1. Other depression with emotional eating Molly Cherry started Wellbutrin 01/26/2021. She is doing well and feeling less stressed, more focused, and more confident. No issues with sleep.  2. Vitamin D deficiency She is currently taking prescription vitamin D 50,000 IU each week. She denies nausea, vomiting or muscle weakness.  Lab Results  Component Value Date   VD25OH 20.8 (L) 11/12/2020   3. Insulin resistance Molly Cherry has a diagnosis of insulin resistance based on her elevated fasting insulin level >5. She continues to work on diet and exercise to decrease her risk of diabetes.  Lab Results  Component Value Date   INSULIN 18.4 11/12/2020   Lab Results  Component Value Date   HGBA1C 5.4 11/12/2020   4. At risk for dehydration Molly Cherry is at risk for dehydration due to inadequate intake.  Assessment/Plan:   Orders Placed This Encounter  Procedures   VITAMIN D 25 Hydroxy (Vit-D Deficiency, Fractures)   Insulin, random    Medications Discontinued During This Encounter  Medication Reason   buPROPion ER (WELLBUTRIN SR) 100 MG 12 hr tablet Reorder   Vitamin D, Ergocalciferol, (DRISDOL) 1.25 MG (50000 UNIT) CAPS capsule Reorder     Meds ordered  this encounter  Medications   buPROPion ER (WELLBUTRIN SR) 100 MG 12 hr tablet    Sig: 1 po q am    Dispense:  30 tablet    Refill:  0    Ov for rf   Vitamin D, Ergocalciferol, (DRISDOL) 1.25 MG (50000 UNIT) CAPS capsule    Sig: Take 1 capsule (50,000 Units total) by mouth every 7 (seven) days.    Dispense:  4 capsule    Refill:  0     1. Other depression with emotional eating We will refill Wellbutrin, as per below. Pt declines need for change in dose. Continue meeting with Dr. Dewaine Conger- working well. Start walking 10-15 minutes everyday.  Refill- buPROPion ER (WELLBUTRIN SR) 100 MG 12 hr tablet; 1 po q am  Dispense: 30 tablet; Refill: 0  2. Vitamin D deficiency Low Vitamin D level contributes to fatigue and are associated with obesity, breast, and colon cancer. She agrees to continue to take prescription Vitamin D 50,000 IU every week and will follow-up for routine testing of Vitamin D, at least 2-3 times per year to avoid over-replacement. Check labs today.  Refill- Vitamin D, Ergocalciferol, (DRISDOL) 1.25 MG (50000 UNIT) CAPS capsule; Take 1 capsule (50,000 Units total) by mouth every 7 (seven) days.  Dispense: 4 capsule; Refill: 0  - VITAMIN D 25 Hydroxy (Vit-D Deficiency, Fractures)  3. Insulin resistance Molly Cherry will continue to work on weight loss, exercise, and decreasing simple carbohydrates to help decrease the risk of diabetes. Molly Cherry agreed to follow-up with [place] as  directed to closely monitor her progress. Check labs today.  - Insulin, random  4. At risk for dehydration Molly Cherry is at higher than average risk of dehydration.  Molly Cherry was given more than 8 minutes of proper hydration counseling today.  We discussed the signs and symptoms of dehydration, some of which may include muscle cramping, constipation or even orthostatic symptoms.  Counseling on the prevention of dehydration was also provided today.  Molly Cherry is at risk for dehydration due to weight loss, lifestyle  and behavorial habits and possibly due to taking certain medication(s).  She was encouraged to adequately hydrate and monitor fluid status to avoid dehydration as well as weight loss plateaus.  Unless pre-existing renal or cardiopulmonary conditions exist, in which patient was told to limit their fluid intake, I recommended roughly one half of their weight in pounds to be the approximate ounces of non-caloric, non-caffeinated beverages they should drink per day; including more if they are engaging in exercise.  5. Obesity with current BMI of 46.2  Molly Cherry is currently in the action stage of change. As such, her goal is to continue with weight loss efforts. She has agreed to the Category 3 Plan.   Exercise goals:  As is  Behavioral modification strategies: increasing lean protein intake, decreasing simple carbohydrates, meal planning and cooking strategies, and planning for success.  Molly Cherry has agreed to follow-up with our clinic in 2-3 weeks. She was informed of the importance of frequent follow-up visits to maximize her success with intensive lifestyle modifications for her multiple health conditions.   Molly Cherry was informed we would discuss her lab results at her next visit unless there is a critical issue that needs to be addressed sooner. Molly Cherry agreed to keep her next visit at the agreed upon time to discuss these results.  Objective:   Blood pressure 135/84, pulse 66, temperature (!) 97.5 F (36.4 C), height 5\' 4"  (1.626 m), weight 269 lb (122 kg), SpO2 99 %. Body mass index is 46.17 kg/m.  General: Cooperative, alert, well developed, in no acute distress. HEENT: Conjunctivae and lids unremarkable. Cardiovascular: Regular rhythm.  Lungs: Normal work of breathing. Neurologic: No focal deficits.   Lab Results  Component Value Date   CREATININE 0.71 11/12/2020   BUN 15 11/12/2020   NA 139 11/12/2020   K 4.7 11/12/2020   CL 103 11/12/2020   CO2 21 11/12/2020   Lab Results   Component Value Date   ALT 17 11/12/2020   AST 19 11/12/2020   ALKPHOS 102 11/12/2020   BILITOT 0.3 11/12/2020   Lab Results  Component Value Date   HGBA1C 5.4 11/12/2020   Lab Results  Component Value Date   INSULIN 18.4 11/12/2020   Lab Results  Component Value Date   TSH 2.150 11/12/2020   Lab Results  Component Value Date   CHOL 177 (H) 11/12/2020   HDL 45 11/12/2020   LDLCALC 113 (H) 11/12/2020   TRIG 103 (H) 11/12/2020   CHOLHDL 3.9 11/12/2020   Lab Results  Component Value Date   VD25OH 20.8 (L) 11/12/2020   Lab Results  Component Value Date   WBC 8.4 11/12/2020   HGB 12.4 11/12/2020   HCT 38.1 11/12/2020   MCV 85 11/12/2020   PLT 269 11/12/2020    Attestation Statements:   Reviewed by clinician on day of visit: allergies, medications, problem list, medical history, surgical history, family history, social history, and previous encounter notes.  11/14/2020, CMA, am acting as Edmund Hilda for Energy manager  Pharaoh Pio, DO.  I have reviewed the above documentation for accuracy and completeness, and I agree with the above. Marjory Sneddon, D.O.  The [place] was signed into law in 2016 which includes the topic of electronic health records.  This provides immediate access to information in MyChart.  This includes consultation notes, operative notes, office notes, lab results and pathology reports.  If you have any questions about what you read please let us know at your next visit so we can discuss your concerns and take corrective action if need be.  We are right here with you.

## 2021-03-03 LAB — INSULIN, RANDOM: INSULIN: 24.2 u[IU]/mL (ref 2.6–24.9)

## 2021-03-03 LAB — VITAMIN D 25 HYDROXY (VIT D DEFICIENCY, FRACTURES): Vit D, 25-Hydroxy: 40.7 ng/mL (ref 30.0–100.0)

## 2021-03-09 ENCOUNTER — Telehealth (INDEPENDENT_AMBULATORY_CARE_PROVIDER_SITE_OTHER): Payer: 59 | Admitting: Psychology

## 2021-03-09 DIAGNOSIS — F419 Anxiety disorder, unspecified: Secondary | ICD-10-CM

## 2021-03-09 DIAGNOSIS — F5089 Other specified eating disorder: Secondary | ICD-10-CM

## 2021-03-16 ENCOUNTER — Ambulatory Visit (INDEPENDENT_AMBULATORY_CARE_PROVIDER_SITE_OTHER): Payer: 59 | Admitting: Family Medicine

## 2021-03-16 ENCOUNTER — Encounter (INDEPENDENT_AMBULATORY_CARE_PROVIDER_SITE_OTHER): Payer: Self-pay | Admitting: Family Medicine

## 2021-03-16 ENCOUNTER — Other Ambulatory Visit: Payer: Self-pay

## 2021-03-16 VITALS — BP 123/82 | HR 85 | Temp 97.7°F | Ht 64.0 in | Wt 270.0 lb

## 2021-03-16 DIAGNOSIS — J3089 Other allergic rhinitis: Secondary | ICD-10-CM | POA: Diagnosis not present

## 2021-03-16 DIAGNOSIS — Z9189 Other specified personal risk factors, not elsewhere classified: Secondary | ICD-10-CM

## 2021-03-16 DIAGNOSIS — E559 Vitamin D deficiency, unspecified: Secondary | ICD-10-CM

## 2021-03-16 DIAGNOSIS — Z6841 Body Mass Index (BMI) 40.0 and over, adult: Secondary | ICD-10-CM

## 2021-03-16 DIAGNOSIS — F3289 Other specified depressive episodes: Secondary | ICD-10-CM

## 2021-03-16 DIAGNOSIS — E8881 Metabolic syndrome: Secondary | ICD-10-CM

## 2021-03-16 MED ORDER — VITAMIN D (ERGOCALCIFEROL) 1.25 MG (50000 UNIT) PO CAPS: ORAL_CAPSULE | ORAL | 0 refills | Status: DC

## 2021-03-16 MED ORDER — MONTELUKAST SODIUM 10 MG PO TABS: 10.0000 mg | ORAL_TABLET | Freq: Every day | ORAL | 0 refills | Status: DC

## 2021-03-16 MED ORDER — BUPROPION HCL ER (SR) 100 MG PO TB12: ORAL_TABLET | ORAL | 0 refills | Status: DC

## 2021-03-16 NOTE — Progress Notes (Signed)
Chief Complaint:   OBESITY Molly Cherry is here to discuss her progress with her obesity treatment plan along with follow-up of her obesity related diagnoses. Molly Cherry is on the Category 3 Plan and states she is following her eating plan approximately 75% of the time. Sharunda states she is doing 0 minutes 0 times per week.  Today's visit was #: 9 Starting weight: 277 lbs Starting date: 11/12/2020 Today's weight: 270 lbs Today's date: 03/16/2021 Total lbs lost to date: 7 Total lbs lost since last in-office visit: 0  Interim History: Molly Cherry is here for a follow up office visit. We reviewed her meal plan and questions were answered. Patient's food recall appears to be accurate and consistent with what is on plan when she is following it. When eating on plan, her hunger and cravings are well controlled. She has been skipping meals lately.   Subjective:   1. Vitamin D deficiency Molly Cherry is taking prescription Vitamin D 50,000 units weekly. Her Vit D level is not at goal, only at 40 and she notes some increased tiredness. She denies nausea, vomiting, or muscle weakness. I discussed labs with the patient today.  2. Insulin resistance Molly Cherry has a diagnosis of insulin resistance based on her elevated fasting insulin level >5. Her level are slightly elevated from prior. She is not on medications currently. She continues to work on diet and exercise to decrease her risk of diabetes.I discussed labs with the patient today.  3. Environmental and seasonal allergies Molly Cherry notes her symptoms are better controlled now. She takes OTC allergy medications and her mom's Singulair.  4. Other depression with emotional eating Molly Cherry met with Dr. Mallie Mussel, and she notes it went well. She has been taking Wellbutrin since early August. She is sleeping well.  5. At risk for deficient intake of food Malani is at a higher than average risk of deficient intake of food due to skipping  meals.  Assessment/Plan:  No orders of the defined types were placed in this encounter.   Medications Discontinued During This Encounter  Medication Reason   buPROPion ER (WELLBUTRIN SR) 100 MG 12 hr tablet Reorder   Vitamin D, Ergocalciferol, (DRISDOL) 1.25 MG (50000 UNIT) CAPS capsule Reorder     Meds ordered this encounter  Medications   buPROPion ER (WELLBUTRIN SR) 100 MG 12 hr tablet    Sig: 1 po q am    Dispense:  30 tablet    Refill:  0    Ov for rf   Vitamin D, Ergocalciferol, (DRISDOL) 1.25 MG (50000 UNIT) CAPS capsule    Sig: 1 po q wed and 1 po q sun    Dispense:  8 capsule    Refill:  0   montelukast (SINGULAIR) 10 MG tablet    Sig: Take 1 tablet (10 mg total) by mouth at bedtime.    Dispense:  30 tablet    Refill:  0     1. Vitamin D deficiency Low Vitamin D level contributes to fatigue and are associated with obesity, breast, and colon cancer. We will refill prescription Vitamin D for 1 month. Molly Cherry will follow-up for routine testing of Vitamin D, at least 2-3 times per year to avoid over-replacement.  - Vitamin D, Ergocalciferol, (DRISDOL) 1.25 MG (50000 UNIT) CAPS capsule; 1 po q wed and 1 po q sun  Dispense: 8 capsule; Refill: 0  2. Insulin resistance Molly Cherry declines medications today. She will focus on increasing her protein and not skipping meals. She will  continue to work on weight loss, exercise, and decreasing simple carbohydrates to help decrease the risk of diabetes. Molly Cherry agreed to follow-up with [place] as directed to closely monitor her progress.  3. Environmental and seasonal allergies Molly Cherry will continue OTC medications and netti pot as needed. A new script was given to the patient for Singulair 10 mg qhs with no refills.  - montelukast (SINGULAIR) 10 MG tablet; Take 1 tablet (10 mg total) by mouth at bedtime.  Dispense: 30 tablet; Refill: 0  4. Other depression with emotional eating Behavior modification techniques were discussed today to  help Molly Cherry deal with her emotional/non-hunger eating behaviors. We will refill Wellbutrin SR for 1 month. Orders and follow up as documented in patient record.   - buPROPion ER (WELLBUTRIN SR) 100 MG 12 hr tablet; 1 po q am  Dispense: 30 tablet; Refill: 0  5. At risk for deficient intake of food Molly Cherry was given approximately 15 minutes of deficit intake of food prevention counseling today. Molly Cherry is at risk for eating too few calories based on current food recall. She was encouraged to focus on meeting caloric and protein goals according to her recommended meal plan.   6. Obesity with current BMI of 46.4 Molly Cherry is currently in the action stage of change. As such, her goal is to continue with weight loss efforts. She has agreed to the Category 3 Plan with breakfast options.   Molly Cherry will laminate her meal plan and mark off to ensure she eats everything on her plan. Additional breakfast options were given.  Exercise goals: Start walking for 15 minutes daily.  Behavioral modification strategies: increasing lean protein intake, decreasing simple carbohydrates, increasing water intake, and meal planning and cooking strategies.  Molly Cherry has agreed to follow-up with our clinic in 3 to 4 weeks. She was informed of the importance of frequent follow-up visits to maximize her success with intensive lifestyle modifications for her multiple health conditions.   Objective:   Blood pressure 123/82, pulse 85, temperature 97.7 F (36.5 C), height 5' 4" (1.626 m), weight 270 lb (122.5 kg), SpO2 98 %. Body mass index is 46.35 kg/m.  General: Cooperative, alert, well developed, in no acute distress. HEENT: Conjunctivae and lids unremarkable. Cardiovascular: Regular rhythm.  Lungs: Normal work of breathing. Neurologic: No focal deficits.   Lab Results  Component Value Date   CREATININE 0.71 11/12/2020   BUN 15 11/12/2020   NA 139 11/12/2020   K 4.7 11/12/2020   CL 103 11/12/2020   CO2  21 11/12/2020   Lab Results  Component Value Date   ALT 17 11/12/2020   AST 19 11/12/2020   ALKPHOS 102 11/12/2020   BILITOT 0.3 11/12/2020   Lab Results  Component Value Date   HGBA1C 5.4 11/12/2020   Lab Results  Component Value Date   INSULIN 24.2 03/02/2021   INSULIN 18.4 11/12/2020   Lab Results  Component Value Date   TSH 2.150 11/12/2020   Lab Results  Component Value Date   CHOL 177 (H) 11/12/2020   HDL 45 11/12/2020   LDLCALC 113 (H) 11/12/2020   TRIG 103 (H) 11/12/2020   CHOLHDL 3.9 11/12/2020   Lab Results  Component Value Date   VD25OH 40.7 03/02/2021   VD25OH 20.8 (L) 11/12/2020   Lab Results  Component Value Date   WBC 8.4 11/12/2020   HGB 12.4 11/12/2020   HCT 38.1 11/12/2020   MCV 85 11/12/2020   PLT 269 11/12/2020   No results found for: IRON,  TIBC, FERRITIN  Attestation Statements:   Reviewed by clinician on day of visit: allergies, medications, problem list, medical history, surgical history, family history, social history, and previous encounter notes.   Wilhemena Durie, am acting as transcriptionist for [employer], DO.  I have reviewed the above documentation for accuracy and completeness, and I agree with the above. Marjory Sneddon, D.O.  The [place] was signed into law in 2016 which includes the topic of electronic health records.  This provides immediate access to information in MyChart.  This includes consultation notes, operative notes, office notes, lab results and pathology reports.  If you have any questions about what you read please let us know at your next visit so we can discuss your concerns and take corrective action if need be.  We are right here with you.

## 2021-03-23 NOTE — Progress Notes (Signed)
  Office: [phone]  /  Fax: [phone]    Date: April 06, 2021   Appointment Start Time: 2:30pm Duration: 26 minutes Provider: Lawerance Cruel, Psy.D. Type of Session: Individual Therapy  Location of Patient: Home (private location) Location of Provider: Provider's Home (private office) Type of Contact: Telepsychological Visit via MyChart Video Visit  Session Content: Molly Cherry is a 20 y.o. female presenting for a follow-up appointment to address the previously established treatment goal of increasing coping skills.Today's appointment was a telepsychological visit due to COVID-19. Molly Cherry provided verbal consent for today's telepsychological appointment and she is aware she is responsible for securing confidentiality on her end of the session. Prior to proceeding with today's appointment, Molly Cherry's physical location at the time of this appointment was obtained as well a phone number she could be reached at in the event of technical difficulties. Molly Cherry and this provider participated in today's telepsychological service.   This provider conducted a brief check-in. 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 Associated thoughts/feelings processed. Regarding eating, Molly Cherry disclosed her roommate will often consume foods Molly Cherry has purchased congruent to her structured meal plan. Engaged Molly Cherry in problem solving. She also discussed experiencing temptation when her roommate brings home takeout/delivery. Psychoeducation regarding the hunger and satisfaction scale was provided to assist with the aforementioned. Molly Cherry provided verbal consent during today's appointment for this provider to send a handout with the scale via e-mail. Overall, Molly Cherry was receptive to today's appointment as evidenced by openness to sharing, responsiveness to feedback, and willingness to implement discussed strategies .  Mental Status Examination:  Appearance:  well groomed and appropriate hygiene  Behavior: appropriate to circumstances Mood: euthymic Affect: mood congruent Speech: normal in rate, volume, and tone Eye Contact: appropriate Psychomotor Activity: appropriate Gait: unable to assess Thought Process: linear, logical, and goal directed  Thought Content/Perception: no hallucinations, delusions, bizarre thinking or behavior reported or observed and no evidence or endorsement of suicidal and homicidal ideation, plan, and intent Orientation: time, person, place, and purpose of appointment Memory/Concentration: memory, attention, language, and fund of knowledge intact  Insight/Judgment: good  Interventions:  Conducted a brief chart review Provided empathic reflections and validation Employed supportive psychotherapy interventions to facilitate reduced distress and to improve coping skills with identified stressors Engaged patient in problem solving Psychoeducation provided regarding the hunger and satisfaction scale  DSM-5 Diagnosis(es): F50.89 Other Specified Feeding or Eating Disorder, Emotional Eating Behaviors and F41.9 Unspecified Anxiety Disorder  Treatment Goal & Progress: During the initial appointment with this provider, the following treatment goal was established: increase coping skills. Molly Cherry has demonstrated progress in her goal as evidenced by increased awareness of hunger patterns, increased awareness of triggers for emotional eating behaviors, and reduction in emotional eating behaviors . Molly Cherry also continues to demonstrate willingness to engage in learned skill(s).  Plan: The next appointment will be scheduled in 3-4 weeks, which will be via MyChart Video Visit. The next session will focus on working towards the established treatment goal and termination.

## 2021-04-06 ENCOUNTER — Telehealth (INDEPENDENT_AMBULATORY_CARE_PROVIDER_SITE_OTHER): Payer: 59 | Admitting: Psychology

## 2021-04-06 DIAGNOSIS — F5089 Other specified eating disorder: Secondary | ICD-10-CM

## 2021-04-06 DIAGNOSIS — F419 Anxiety disorder, unspecified: Secondary | ICD-10-CM

## 2021-04-07 ENCOUNTER — Other Ambulatory Visit (INDEPENDENT_AMBULATORY_CARE_PROVIDER_SITE_OTHER): Payer: Self-pay | Admitting: Family Medicine

## 2021-04-07 DIAGNOSIS — E559 Vitamin D deficiency, unspecified: Secondary | ICD-10-CM

## 2021-04-07 DIAGNOSIS — J3089 Other allergic rhinitis: Secondary | ICD-10-CM

## 2021-04-13 ENCOUNTER — Encounter (INDEPENDENT_AMBULATORY_CARE_PROVIDER_SITE_OTHER): Payer: Self-pay

## 2021-04-13 ENCOUNTER — Other Ambulatory Visit (INDEPENDENT_AMBULATORY_CARE_PROVIDER_SITE_OTHER): Payer: Self-pay | Admitting: Family Medicine

## 2021-04-13 ENCOUNTER — Ambulatory Visit (INDEPENDENT_AMBULATORY_CARE_PROVIDER_SITE_OTHER): Payer: 59 | Admitting: Family Medicine

## 2021-04-13 DIAGNOSIS — F3289 Other specified depressive episodes: Secondary | ICD-10-CM

## 2021-04-13 DIAGNOSIS — E559 Vitamin D deficiency, unspecified: Secondary | ICD-10-CM

## 2021-04-13 NOTE — Progress Notes (Signed)
  Office: [phone]  /  Fax: [phone]    Date: April 27, 2021   Appointment Start Time: 2:35pm Duration: 23 minutes Provider: Lawerance Cruel, Psy.D. Type of Session: Individual Therapy  Location of Patient: Home (private location) Location of Provider: Provider's Home (private office) Type of Contact: Telepsychological Visit via MyChart Video Visit  Session Content: Molly Cherry is a 20 y.o. female presenting for a follow-up appointment to address the previously established treatment goal of increasing coping skills.Today's appointment was a telepsychological visit due to COVID-19. Molly Cherry provided verbal consent for today's telepsychological appointment and she is aware she is responsible for securing confidentiality on her end of the session. Prior to proceeding with today's appointment, Molly Cherry's physical location at the time of this appointment was obtained as well a phone number she could be reached at in the event of technical difficulties. Molly Cherry and this provider participated in today's telepsychological service.   This provider conducted a brief check-in. Molly Cherry stated she prepped all her dinner meals the past week, noting it helped her after exhausting days. She continues to do well with lunch, adding she "pre-make[s]" them. Molly Cherry acknowledged an instance of emotional eating secondary to stress; however, she continues to report a reduction in emotional eating behaviors. Moreover, Molly Cherry acknowledged experiencing "what if" thoughts as it relates to her progress to date. As such, Molly Cherry was led through a mindfulness exercise (Leaves on a Stream) and her experience was processed. Molly Cherry provided verbal consent during today's appointment for this provider to send a YouTube link for today's exercise via e-mail. Overall, Molly Cherry was receptive to today's appointment as evidenced by openness to sharing, responsiveness to feedback, and willingness to implement discussed strategies  .  Mental Status Examination:  Appearance: well groomed and appropriate hygiene  Behavior: appropriate to circumstances Mood: euthymic Affect: mood congruent Speech: normal in rate, volume, and tone Eye Contact: appropriate Psychomotor Activity: appropriate Gait: unable to assess Thought Process: linear, logical, and goal directed  Thought Content/Perception: no hallucinations, delusions, bizarre thinking or behavior reported or observed and no evidence or endorsement of suicidal and homicidal ideation, plan, and intent Orientation: time, person, place, and purpose of appointment Memory/Concentration: memory, attention, language, and fund of knowledge intact  Insight/Judgment: good  Interventions:  Conducted a brief chart review Provided empathic reflections and validation Reviewed content from the previous session Employed supportive psychotherapy interventions to facilitate reduced distress and to improve coping skills with identified stressors Engaged patient in mindfulness exercise(s)  DSM-5 Diagnosis(es):  F50.89 Other Specified Feeding or Eating Disorder, Emotional Eating Behaviors and F41.9 Unspecified Anxiety Disorder  Treatment Goal & Progress: During the initial appointment with this provider, the following treatment goal was established: increase coping skills. Molly Cherry demonstrated progress in her goal as evidenced by increased awareness of hunger patterns, increased awareness of triggers for emotional eating behaviors, and reduction in emotional eating behaviors . Molly Cherry also continues to demonstrate willingness to engage in learned skill(s).  Plan: As previously planned, today was Molly Cherry's last appointment with this provider. She acknowledged understanding that she may request a follow-up appointment with this provider in the future as long as she is still established with the clinic. No further follow-up planned by this provider.

## 2021-04-14 NOTE — Telephone Encounter (Signed)
Last OV with Dr Opalski 

## 2021-04-27 ENCOUNTER — Telehealth (INDEPENDENT_AMBULATORY_CARE_PROVIDER_SITE_OTHER): Payer: 59 | Admitting: Psychology

## 2021-04-27 DIAGNOSIS — F419 Anxiety disorder, unspecified: Secondary | ICD-10-CM | POA: Diagnosis not present

## 2021-04-27 DIAGNOSIS — F5089 Other specified eating disorder: Secondary | ICD-10-CM | POA: Diagnosis not present

## 2021-05-02 ENCOUNTER — Encounter (INDEPENDENT_AMBULATORY_CARE_PROVIDER_SITE_OTHER): Payer: Self-pay | Admitting: Family Medicine

## 2021-05-04 ENCOUNTER — Ambulatory Visit (INDEPENDENT_AMBULATORY_CARE_PROVIDER_SITE_OTHER): Payer: 59 | Admitting: Family Medicine

## 2021-05-25 ENCOUNTER — Ambulatory Visit (INDEPENDENT_AMBULATORY_CARE_PROVIDER_SITE_OTHER): Payer: 59 | Admitting: Family Medicine

## 2021-05-25 ENCOUNTER — Other Ambulatory Visit: Payer: Self-pay

## 2021-05-25 ENCOUNTER — Encounter (INDEPENDENT_AMBULATORY_CARE_PROVIDER_SITE_OTHER): Payer: Self-pay | Admitting: Family Medicine

## 2021-05-25 VITALS — BP 122/80 | HR 79 | Temp 97.7°F | Ht 64.0 in | Wt 270.0 lb

## 2021-05-25 DIAGNOSIS — E66813 Obesity, class 3: Secondary | ICD-10-CM

## 2021-05-25 DIAGNOSIS — Z6841 Body Mass Index (BMI) 40.0 and over, adult: Secondary | ICD-10-CM | POA: Diagnosis not present

## 2021-05-25 DIAGNOSIS — Z9189 Other specified personal risk factors, not elsewhere classified: Secondary | ICD-10-CM | POA: Diagnosis not present

## 2021-05-25 DIAGNOSIS — E559 Vitamin D deficiency, unspecified: Secondary | ICD-10-CM | POA: Diagnosis not present

## 2021-05-25 DIAGNOSIS — F3289 Other specified depressive episodes: Secondary | ICD-10-CM

## 2021-05-25 MED ORDER — VITAMIN D (ERGOCALCIFEROL) 1.25 MG (50000 UNIT) PO CAPS: ORAL_CAPSULE | ORAL | 0 refills | Status: DC

## 2021-05-25 MED ORDER — BUPROPION HCL ER (SR) 100 MG PO TB12: ORAL_TABLET | ORAL | 0 refills | Status: DC

## 2021-05-26 NOTE — Progress Notes (Signed)
Chief Complaint:   OBESITY Molly Cherry is here to discuss her progress with her obesity treatment plan along with follow-up of her obesity related diagnoses. Molly Cherry is on the Category 3 Plan with breakfast options and states she is following her eating plan approximately 80% of the time. Molly Cherry states she is walking for 15-30 minutes 5 times per week.  Today's visit was #: 10 Starting weight: 277 lbs Starting date: 11/12/2020 Today's weight: 270 lbs Today's date: 05/25/2021 Total lbs lost to date: 7 Total lbs lost since last in-office visit: 0  Interim History: Molly Cherry is trying to make lunch and prepare dinners more. If she doesn't have it prepared, she will make poor decisions or not eat at all  Subjective:   1. Vitamin D deficiency Molly Cherry is currently taking prescription vitamin D 50,000 IU twice weekly. She denies nausea, vomiting or muscle weakness.  2. Other depression with emotional eating Molly Cherry's mood is stable, but she notes increased cravings with stress of finals and some stress eating.  3. At risk for deficient intake of food Molly Cherry is at a higher than average risk of deficient intake of food due to eating habits.   Assessment/Plan:  No orders of the defined types were placed in this encounter.   Medications Discontinued During This Encounter  Medication Reason   buPROPion ER (WELLBUTRIN SR) 100 MG 12 hr tablet Reorder   Vitamin D, Ergocalciferol, (DRISDOL) 1.25 MG (50000 UNIT) CAPS capsule Reorder     Meds ordered this encounter  Medications   buPROPion ER (WELLBUTRIN SR) 100 MG 12 hr tablet    Sig: 1 po q am and 1 po q late afternoon    Dispense:  60 tablet    Refill:  0    Ov for rf   Vitamin D, Ergocalciferol, (DRISDOL) 1.25 MG (50000 UNIT) CAPS capsule    Sig: 1 po q wed and 1 po q sun    Dispense:  8 capsule    Refill:  0    Please dispense 30 d supply--> 8 tabs each month     1. Vitamin D deficiency We will refill prescription  Vitamin D 50,000 IU twice weekly for 1 month. Molly Cherry follow-up for routine testing of Vitamin D, at least 2-3 times per year to avoid over-replacement.  - Vitamin D, Ergocalciferol, (DRISDOL) 1.25 MG (50000 UNIT) CAPS capsule; 1 po q wed and 1 po q sun  Dispense: 8 capsule; Refill: 0  2. Other depression with emotional eating Molly Cherry agreed to increase Wellbutrin SR to 100 mg BID (up from once daily), and we will refill for 1 month.   Behavior modification techniques were discussed today to help Molly Cherry deal with her emotional/non-hunger eating behaviors. Orders and follow up as documented in patient record.   - buPROPion ER (WELLBUTRIN SR) 100 MG 12 hr tablet; 1 po q am and 1 po q late afternoon  Dispense: 60 tablet; Refill: 0  3. At risk for deficient intake of food Molly Cherry was given approximately 9 minutes of deficit intake of food prevention counseling today. Molly Cherry is at risk for eating too few calories based on current food recall. She was encouraged to focus on meeting caloric and protein goals according to her recommended meal plan.   4. Obesity with current BMI of 46.4 Molly Cherry is currently in the action stage of change. As such, her goal is to continue with weight loss efforts. She has agreed to change to keeping a food journal and adhering to  recommended goals of 1500-1700 calories and 100+ grams of protein daily.   Exercise goals: For substantial health benefits, adults should do at least 150 minutes (2 hours and 30 minutes) a week of moderate-intensity, or 75 minutes (1 hour and 15 minutes) a week of vigorous-intensity aerobic physical activity, or an equivalent combination of moderate- and vigorous-intensity aerobic activity. Aerobic activity should be performed in episodes of at least 10 minutes, and preferably, it should be spread throughout the week.  Behavioral modification strategies: increasing lean protein intake, decreasing simple carbohydrates, and planning for  success.  Molly Cherry has agreed to follow-up with our clinic in 2 to 3 weeks. She was informed of the importance of frequent follow-up visits to maximize her success with intensive lifestyle modifications for her multiple health conditions.   Objective:   Blood pressure 122/80, pulse 79, temperature 97.7 F (36.5 C), height 5\' 4"  (1.626 m), weight 270 lb (122.5 kg), SpO2 99 %. Body mass index is 46.35 kg/m.  General: Cooperative, alert, well developed, in no acute distress. HEENT: Conjunctivae and lids unremarkable. Cardiovascular: Regular rhythm.  Lungs: Normal work of breathing. Neurologic: No focal deficits.   Lab Results  Component Value Date   CREATININE 0.71 11/12/2020   BUN 15 11/12/2020   NA 139 11/12/2020   K 4.7 11/12/2020   CL 103 11/12/2020   CO2 21 11/12/2020   Lab Results  Component Value Date   ALT 17 11/12/2020   AST 19 11/12/2020   ALKPHOS 102 11/12/2020   BILITOT 0.3 11/12/2020   Lab Results  Component Value Date   HGBA1C 5.4 11/12/2020   Lab Results  Component Value Date   INSULIN 24.2 03/02/2021   INSULIN 18.4 11/12/2020   Lab Results  Component Value Date   TSH 2.150 11/12/2020   Lab Results  Component Value Date   CHOL 177 (H) 11/12/2020   HDL 45 11/12/2020   LDLCALC 113 (H) 11/12/2020   TRIG 103 (H) 11/12/2020   CHOLHDL 3.9 11/12/2020   Lab Results  Component Value Date   VD25OH 40.7 03/02/2021   VD25OH 20.8 (L) 11/12/2020   Lab Results  Component Value Date   WBC 8.4 11/12/2020   HGB 12.4 11/12/2020   HCT 38.1 11/12/2020   MCV 85 11/12/2020   PLT 269 11/12/2020   No results found for: IRON, TIBC, FERRITIN  Attestation Statements:   Reviewed by clinician on day of visit: allergies, medications, problem list, medical history, surgical history, family history, social history, and previous encounter notes.   11/14/2020, am acting as transcriptionist for Trude Mcburney, DO.  I have reviewed the above documentation for  accuracy and completeness, and I agree with the above. [employer], D.O.  The 21st Century Cures Act was signed into law in 2016 which includes the topic of electronic health records.  This provides immediate access to information in MyChart.  This includes consultation notes, operative notes, office notes, lab results and pathology reports.  If you have any questions about what you read please let 2017 know at your next visit so we can discuss your concerns and take corrective action if need be.  We are right here with you.

## 2021-06-08 ENCOUNTER — Encounter (INDEPENDENT_AMBULATORY_CARE_PROVIDER_SITE_OTHER): Payer: Self-pay | Admitting: Family Medicine

## 2021-06-08 ENCOUNTER — Other Ambulatory Visit: Payer: Self-pay

## 2021-06-08 ENCOUNTER — Ambulatory Visit (INDEPENDENT_AMBULATORY_CARE_PROVIDER_SITE_OTHER): Payer: 59 | Admitting: Family Medicine

## 2021-06-08 VITALS — BP 120/81 | HR 87 | Temp 97.9°F | Ht 64.0 in | Wt 273.0 lb

## 2021-06-08 DIAGNOSIS — F3289 Other specified depressive episodes: Secondary | ICD-10-CM | POA: Diagnosis not present

## 2021-06-08 DIAGNOSIS — E559 Vitamin D deficiency, unspecified: Secondary | ICD-10-CM | POA: Diagnosis not present

## 2021-06-08 DIAGNOSIS — Z6841 Body Mass Index (BMI) 40.0 and over, adult: Secondary | ICD-10-CM | POA: Diagnosis not present

## 2021-06-08 DIAGNOSIS — Z9189 Other specified personal risk factors, not elsewhere classified: Secondary | ICD-10-CM

## 2021-06-08 MED ORDER — VITAMIN D (ERGOCALCIFEROL) 1.25 MG (50000 UNIT) PO CAPS: ORAL_CAPSULE | ORAL | 0 refills | Status: DC

## 2021-06-08 MED ORDER — BUPROPION HCL ER (SR) 100 MG PO TB12: ORAL_TABLET | ORAL | 0 refills | Status: DC

## 2021-06-09 NOTE — Progress Notes (Signed)
Chief Complaint:   OBESITY Molly Cherry is here to discuss her progress with her obesity treatment plan along with follow-up of her obesity related diagnoses. Molly Cherry is on keeping a food journal and adhering to recommended goals of 1500-1700 calories and 100 grams protein and states she is following her eating plan approximately 90% of the time. Molly Cherry states she is walking 15 minutes 4-5 times per week.  Today's visit was #: 11 Starting weight: 277 lbs Starting date: 11/12/2020 Today's weight: 273 lbs Today's date: 06/08/2021 Total lbs lost to date: 4 Total lbs lost since last in-office visit: +3  Interim History: Molly Cherry tracked and brought in her log today. She only missed 3 days of logging. She learned a lot about herself and how much she skips meals/foods. She packs her lunch everyday and is eating out for dinner at times, which makes her realize it's much unhealthier and more calories than she makes at home.  Subjective:   1. Other depression with emotional eating We increased Wellbutrin to 100 mg BID at last OV. Pt reports less cravings.  2. Vitamin D deficiency She is currently taking prescription vitamin D 50,000 IU twice a week. She denies nausea, vomiting or muscle weakness.  3. At risk for malnutrition Molly Cherry is at risk for malnutrition due to inadequate calories or too many or not enough protein.  Assessment/Plan:  No orders of the defined types were placed in this encounter.   Medications Discontinued During This Encounter  Medication Reason   buPROPion ER (WELLBUTRIN SR) 100 MG 12 hr tablet Reorder   Vitamin D, Ergocalciferol, (DRISDOL) 1.25 MG (50000 UNIT) CAPS capsule Reorder     Meds ordered this encounter  Medications   Vitamin D, Ergocalciferol, (DRISDOL) 1.25 MG (50000 UNIT) CAPS capsule    Sig: 1 po q wed and 1 po q sun    Dispense:  8 capsule    Refill:  0    Please dispense 30 d supply--> 8 tabs each month   buPROPion ER (WELLBUTRIN SR) 100  MG 12 hr tablet    Sig: 1 po q am and 1 po q late afternoon    Dispense:  60 tablet    Refill:  0    Ov for rf     1. Other depression with emotional eating Behavior modification techniques were discussed today to help Molly Cherry deal with her emotional/non-hunger eating behaviors.  Orders and follow up as documented in patient record.   Refill- buPROPion ER (WELLBUTRIN SR) 100 MG 12 hr tablet; 1 po q am and 1 po q late afternoon  Dispense: 60 tablet; Refill: 0  2. Vitamin D deficiency Low Vitamin D level contributes to fatigue and are associated with obesity, breast, and colon cancer. She agrees to continue to take prescription Vitamin D 50,000 IU twice a week and will follow-up for routine testing of Vitamin D, at least 2-3 times per year to avoid over-replacement.  Refill- Vitamin D, Ergocalciferol, (DRISDOL) 1.25 MG (50000 UNIT) CAPS capsule; 1 po q wed and 1 po q sun  Dispense: 8 capsule; Refill: 0  3. At risk for malnutrition Molly Cherry was given approximately 9 minutes of counseling today regarding prevention of malnutrition and ways to meet macronutrient goals.  4. Obesity with current BMI of 46.9  Molly Cherry is currently in the action stage of change. As such, her goal is to continue with weight loss efforts. She has agreed to keeping a food journal and adhering to recommended goals of 1500-1700  calories and 100 grams protein.   Continue to journal. Goal is to not gain weight.  Exercise goals: For substantial health benefits, adults should do at least 150 minutes (2 hours and 30 minutes) a week of moderate-intensity, or 75 minutes (1 hour and 15 minutes) a week of vigorous-intensity aerobic physical activity, or an equivalent combination of moderate- and vigorous-intensity aerobic activity. Aerobic activity should be performed in episodes of at least 10 minutes, and preferably, it should be spread throughout the week. Increase as tolerated.  Behavioral modification strategies:  increasing lean protein intake, decreasing simple carbohydrates, decreasing eating out, and planning for success.  Molly Cherry has agreed to follow-up with our clinic in 3 weeks. She was informed of the importance of frequent follow-up visits to maximize her success with intensive lifestyle modifications for her multiple health conditions.   Objective:   Blood pressure 120/81, pulse 87, temperature 97.9 F (36.6 C), height 5\' 4"  (1.626 m), weight 273 lb (123.8 kg), SpO2 98 %. Body mass index is 46.86 kg/m.  General: Cooperative, alert, well developed, in no acute distress. HEENT: Conjunctivae and lids unremarkable. Cardiovascular: Regular rhythm.  Lungs: Normal work of breathing. Neurologic: No focal deficits.   Lab Results  Component Value Date   CREATININE 0.71 11/12/2020   BUN 15 11/12/2020   NA 139 11/12/2020   K 4.7 11/12/2020   CL 103 11/12/2020   CO2 21 11/12/2020   Lab Results  Component Value Date   ALT 17 11/12/2020   AST 19 11/12/2020   ALKPHOS 102 11/12/2020   BILITOT 0.3 11/12/2020   Lab Results  Component Value Date   HGBA1C 5.4 11/12/2020   Lab Results  Component Value Date   INSULIN 24.2 03/02/2021   INSULIN 18.4 11/12/2020   Lab Results  Component Value Date   TSH 2.150 11/12/2020   Lab Results  Component Value Date   CHOL 177 (H) 11/12/2020   HDL 45 11/12/2020   LDLCALC 113 (H) 11/12/2020   TRIG 103 (H) 11/12/2020   CHOLHDL 3.9 11/12/2020   Lab Results  Component Value Date   VD25OH 40.7 03/02/2021   VD25OH 20.8 (L) 11/12/2020   Lab Results  Component Value Date   WBC 8.4 11/12/2020   HGB 12.4 11/12/2020   HCT 38.1 11/12/2020   MCV 85 11/12/2020   PLT 269 11/12/2020    Attestation Statements:   Reviewed by clinician on day of visit: allergies, medications, problem list, medical history, surgical history, family history, social history, and previous encounter notes.  11/14/2020, CMA, am acting as transcriptionist for  Edmund Hilda, DO.  I have reviewed the above documentation for accuracy and completeness, and I agree with the above. [employer], D.O.  The 21st Century Cures Act was signed into law in 2016 which includes the topic of electronic health records.  This provides immediate access to information in MyChart.  This includes consultation notes, operative notes, office notes, lab results and pathology reports.  If you have any questions about what you read please let 2017 know at your next visit so we can discuss your concerns and take corrective action if need be.  We are right here with you.

## 2021-06-16 ENCOUNTER — Other Ambulatory Visit (INDEPENDENT_AMBULATORY_CARE_PROVIDER_SITE_OTHER): Payer: Self-pay | Admitting: Family Medicine

## 2021-06-16 DIAGNOSIS — E559 Vitamin D deficiency, unspecified: Secondary | ICD-10-CM

## 2021-06-29 ENCOUNTER — Encounter (INDEPENDENT_AMBULATORY_CARE_PROVIDER_SITE_OTHER): Payer: Self-pay | Admitting: Family Medicine

## 2021-06-29 ENCOUNTER — Ambulatory Visit (INDEPENDENT_AMBULATORY_CARE_PROVIDER_SITE_OTHER): Payer: 59 | Admitting: Family Medicine

## 2021-06-29 ENCOUNTER — Other Ambulatory Visit: Payer: Self-pay

## 2021-06-29 VITALS — BP 127/85 | HR 96 | Temp 97.9°F | Ht 64.0 in | Wt 266.0 lb

## 2021-06-29 DIAGNOSIS — J3089 Other allergic rhinitis: Secondary | ICD-10-CM

## 2021-06-29 DIAGNOSIS — E559 Vitamin D deficiency, unspecified: Secondary | ICD-10-CM | POA: Diagnosis not present

## 2021-06-29 DIAGNOSIS — Z6841 Body Mass Index (BMI) 40.0 and over, adult: Secondary | ICD-10-CM

## 2021-06-29 DIAGNOSIS — F3289 Other specified depressive episodes: Secondary | ICD-10-CM

## 2021-06-29 DIAGNOSIS — E8881 Metabolic syndrome: Secondary | ICD-10-CM | POA: Diagnosis not present

## 2021-06-29 DIAGNOSIS — E669 Obesity, unspecified: Secondary | ICD-10-CM

## 2021-06-29 DIAGNOSIS — Z9189 Other specified personal risk factors, not elsewhere classified: Secondary | ICD-10-CM

## 2021-06-29 DIAGNOSIS — E7849 Other hyperlipidemia: Secondary | ICD-10-CM | POA: Diagnosis not present

## 2021-06-29 MED ORDER — MONTELUKAST SODIUM 10 MG PO TABS: 10.0000 mg | ORAL_TABLET | Freq: Every day | ORAL | 0 refills | Status: DC

## 2021-06-30 LAB — COMPREHENSIVE METABOLIC PANEL
ALT: 15 IU/L (ref 0–32)
AST: 18 IU/L (ref 0–40)
Albumin/Globulin Ratio: 1.5 (ref 1.2–2.2)
Albumin: 4.4 g/dL (ref 3.9–5.0)
Alkaline Phosphatase: 111 IU/L — ABNORMAL HIGH (ref 42–106)
BUN/Creatinine Ratio: 16 (ref 9–23)
BUN: 14 mg/dL (ref 6–20)
Bilirubin Total: <0.2 mg/dL (ref 0.0–1.2)
CO2: 22 mmol/L (ref 20–29)
Calcium: 9 mg/dL (ref 8.7–10.2)
Chloride: 106 mmol/L (ref 96–106)
Creatinine, Ser: 0.9 mg/dL (ref 0.57–1.00)
Globulin, Total: 2.9 g/dL (ref 1.5–4.5)
Glucose: 82 mg/dL (ref 70–99)
Potassium: 4.3 mmol/L (ref 3.5–5.2)
Sodium: 141 mmol/L (ref 134–144)
Total Protein: 7.3 g/dL (ref 6.0–8.5)
eGFR: 94 mL/min/{1.73_m2} (ref >59)

## 2021-06-30 LAB — LIPID PANEL
Chol/HDL Ratio: 3.2 ratio (ref 0.0–4.4)
Cholesterol, Total: 181 mg/dL — ABNORMAL HIGH (ref 100–169)
HDL: 56 mg/dL (ref >39)
LDL Chol Calc (NIH): 107 mg/dL (ref 0–109)
Triglycerides: 98 mg/dL — ABNORMAL HIGH (ref 0–89)
VLDL Cholesterol Cal: 18 mg/dL (ref 5–40)

## 2021-06-30 LAB — VITAMIN D 25 HYDROXY (VIT D DEFICIENCY, FRACTURES): Vit D, 25-Hydroxy: 54.1 ng/mL (ref 30.0–100.0)

## 2021-06-30 LAB — INSULIN, RANDOM: INSULIN: 22 u[IU]/mL (ref 2.6–24.9)

## 2021-06-30 LAB — HEMOGLOBIN A1C
Est. average glucose Bld gHb Est-mCnc: 114 mg/dL
Hgb A1c MFr Bld: 5.6 % (ref 4.8–5.6)

## 2021-06-30 NOTE — Progress Notes (Signed)
Chief Complaint:   OBESITY Molly Cherry is here to discuss her progress with her obesity treatment plan along with follow-up of her obesity related diagnoses. Molly Cherry is on keeping a food journal and adhering to recommended goals of 1500-1700 calories and 100 grams of protein daily and states she is following her eating plan approximately 75% of the time. Molly Cherry states she is walking for 15 minutes 4-5 times per week.  Today's visit was #: 12 Starting weight: 277 lbs Starting date: 11/12/2020 Today's weight: 266 lbs Today's date: 06/29/2021 Total lbs lost to date: 11 Total lbs lost since last in-office visit: 7  Interim History: Molly Cherry did great over the holidays!   She lost 8+ lbs of fat mass, and gained 2+ lbs in muscle since her last office visit.  She endorses she made sure she ate more protein this time.  She did excellent at tracking and journaling, and reflecting on her progress- she made a color-coded journal with the meal plan.    Subjective:   1. Insulin resistance Molly Cherry has a diagnosis of insulin resistance based on her elevated fasting insulin level >5. She continues to work on diet and exercise to decrease her risk of diabetes.  2. Environmental and seasonal allergies Molly Cherry has seasonal allergies that have been more bothersome with the change of weather/season.       3. Vitamin D deficiency Molly Cherry is currently taking prescription vitamin D 50,000 IU twice weekly. She denies nausea, vomiting or muscle weakness.  4. Other hyperlipidemia Molly Cherry has hyperlipidemia and has been trying to improve her cholesterol levels with intensive lifestyle modification including a low saturated fat diet, exercise and weight loss. She denies any chest pain, claudication or myalgias.  5. At risk for impaired metabolic function Molly Cherry is at increased risk for impaired metabolic function due to insulin resistance.  Assessment/Plan:   Orders Placed This Encounter   Procedures   Lipid panel   Comprehensive metabolic panel   Hemoglobin A1c   Insulin, random   VITAMIN D 25 Hydroxy (Vit-D Deficiency, Fractures)    Medications Discontinued During This Encounter  Medication Reason   montelukast (SINGULAIR) 10 MG tablet Reorder     Meds ordered this encounter  Medications   montelukast (SINGULAIR) 10 MG tablet    Sig: Take 1 tablet (10 mg total) by mouth at bedtime.    Dispense:  30 tablet    Refill:  0     1. Insulin resistance We will check labs today. Molly Cherry will continue to work on weight loss, exercise, and decreasing simple carbohydrates to help decrease the risk of diabetes. Molly Cherry agreed to follow-up with [place] as directed to closely monitor her progress.  - Hemoglobin A1c - Insulin, random  2. Environmental and seasonal allergies Condition- stable. The patient has no concerns and their symptoms are well controlled.  We will continue with the current treatment regimen. Molly Cherry will continue Singulair, and we will refill for 1 month.  - montelukast (SINGULAIR) 10 MG tablet; Take 1 tablet (10 mg total) by mouth at bedtime.  Dispense: 30 tablet; Refill: 0  3. Vitamin D deficiency Low Vitamin D level contributes to fatigue and are associated with obesity, breast, and colon cancer. We will check labs today. Molly Cherry will follow-up for routine testing of Vitamin D, at least 2-3 times per year to avoid over-replacement.  - VITAMIN D 25 Hydroxy (Vit-D Deficiency, Fractures)  4. Other hyperlipidemia Cardiovascular risk and specific lipid/LDL goals reviewed. We discussed several lifestyle modifications  today. We will check labs today. Molly Cherry will continue to work on diet, exercise and weight loss efforts. Orders and follow up as documented in patient record.   Counseling Intensive lifestyle modifications are the first line treatment for this issue. Dietary changes: Increase soluble fiber. Decrease simple carbohydrates. Exercise changes:  Moderate to vigorous-intensity aerobic activity 150 minutes per week if tolerated. Lipid-lowering medications: see documented in medical record.  - Lipid panel - Comprehensive metabolic panel  5. At risk for impaired metabolic function Due to [facility]'s current state of health and medical condition(s), she is at a significantly higher risk for impaired metabolic function.   At least 8.5 minutes was spent on counseling Molly about these concerns today.  This places the patient at a much greater risk to subsequently develop cardio-pulmonary conditions that can negatively affect the patient's quality of life.  I stressed the importance of reversing these risks factors.  The initial goal is to lose at least 5-10% of starting weight to help reduce risk factors.  Counseling:  Intensive lifestyle modifications discussed with Molly Cherry as the most appropriate first line treatment.  she will continue to work on diet, exercise, and weight loss efforts.  We will continue to reassess these conditions on a fairly regular basis in an attempt to decrease the patient's overall morbidity and mortality.   6. Obesity with current BMI of 45.8 [name] is currently in the action stage of change. As such, her goal is to continue with weight loss efforts. She has agreed to keeping a food journal and adhering to recommended goals of 1500-1700 calories and 100 grams of protein daily.   Exercise goals: As is.  Behavioral modification strategies: planning for success and keeping a strict food journal.  Molly Cherry has agreed to follow-up with our clinic in 2 weeks. She was informed of the importance of frequent follow-up visits to maximize her success with intensive lifestyle modifications for her multiple health conditions.   Molly Cherry was informed we would discuss her lab results at her next visit unless there is a critical issue that needs to be addressed sooner. Molly Cherry agreed to keep her next visit at the agreed upon  time to discuss these results.  Objective:   Blood pressure 127/85, pulse 96, temperature 97.9 F (36.6 C), height 5\' 4"  (1.626 m), weight 266 lb (120.7 kg), SpO2 97 %. Body mass index is 45.66 kg/m.  General: Cooperative, alert, well developed, in no acute distress. HEENT: Conjunctivae and lids unremarkable. Cardiovascular: Regular rhythm.  Lungs: Normal work of breathing. Neurologic: No focal deficits.   Lab Results  Component Value Date   CREATININE 0.90 06/29/2021   BUN 14 06/29/2021   NA 141 06/29/2021   K 4.3 06/29/2021   CL 106 06/29/2021   CO2 22 06/29/2021   Lab Results  Component Value Date   ALT 15 06/29/2021   AST 18 06/29/2021   ALKPHOS 111 (H) 06/29/2021   BILITOT <0.2 06/29/2021   Lab Results  Component Value Date   HGBA1C 5.6 06/29/2021   HGBA1C 5.4 11/12/2020   Lab Results  Component Value Date   INSULIN 22.0 06/29/2021   INSULIN 24.2 03/02/2021   INSULIN 18.4 11/12/2020   Lab Results  Component Value Date   TSH 2.150 11/12/2020   Lab Results  Component Value Date   CHOL 181 (H) 06/29/2021   HDL 56 06/29/2021   LDLCALC 107 06/29/2021   TRIG 98 (H) 06/29/2021   CHOLHDL 3.2 06/29/2021   Lab Results  Component Value  Date   VD25OH 54.1 06/29/2021   VD25OH 40.7 03/02/2021   VD25OH 20.8 (L) 11/12/2020   Lab Results  Component Value Date   WBC 8.4 11/12/2020   HGB 12.4 11/12/2020   HCT 38.1 11/12/2020   MCV 85 11/12/2020   PLT 269 11/12/2020   No results found for: IRON, TIBC, FERRITIN  Attestation Statements:   Reviewed by clinician on day of visit: allergies, medications, problem list, medical history, surgical history, family history, social history, and previous encounter notes.   Trude Mcburney, am acting as transcriptionist for [employer], DO.  I have reviewed the above documentation for accuracy and completeness, and I agree with the above. [name], D.O.  The 21st Century Cures Act was signed into law  in 2016 which includes the topic of electronic health records.  This provides immediate access to information in MyChart.  This includes consultation notes, operative notes, office notes, lab results and pathology reports.  If you have any questions about what you read please let us know at your next visit so we can discuss your concerns and take corrective action if need be.  We are right here with you.

## 2021-07-11 ENCOUNTER — Other Ambulatory Visit (INDEPENDENT_AMBULATORY_CARE_PROVIDER_SITE_OTHER): Payer: Self-pay | Admitting: Family Medicine

## 2021-07-11 DIAGNOSIS — E559 Vitamin D deficiency, unspecified: Secondary | ICD-10-CM

## 2021-07-13 ENCOUNTER — Ambulatory Visit (INDEPENDENT_AMBULATORY_CARE_PROVIDER_SITE_OTHER): Payer: 59 | Admitting: Family Medicine

## 2021-07-13 ENCOUNTER — Other Ambulatory Visit (INDEPENDENT_AMBULATORY_CARE_PROVIDER_SITE_OTHER): Payer: Self-pay | Admitting: Family Medicine

## 2021-07-13 ENCOUNTER — Other Ambulatory Visit: Payer: Self-pay

## 2021-07-13 ENCOUNTER — Encounter (INDEPENDENT_AMBULATORY_CARE_PROVIDER_SITE_OTHER): Payer: Self-pay | Admitting: Family Medicine

## 2021-07-13 VITALS — BP 115/77 | HR 87 | Temp 98.0°F | Ht 64.0 in | Wt 272.0 lb

## 2021-07-13 DIAGNOSIS — Z6841 Body Mass Index (BMI) 40.0 and over, adult: Secondary | ICD-10-CM

## 2021-07-13 DIAGNOSIS — E559 Vitamin D deficiency, unspecified: Secondary | ICD-10-CM

## 2021-07-13 DIAGNOSIS — E8881 Metabolic syndrome: Secondary | ICD-10-CM

## 2021-07-13 DIAGNOSIS — Z9189 Other specified personal risk factors, not elsewhere classified: Secondary | ICD-10-CM | POA: Diagnosis not present

## 2021-07-13 DIAGNOSIS — J3089 Other allergic rhinitis: Secondary | ICD-10-CM

## 2021-07-13 DIAGNOSIS — E7849 Other hyperlipidemia: Secondary | ICD-10-CM | POA: Diagnosis not present

## 2021-07-13 DIAGNOSIS — F3289 Other specified depressive episodes: Secondary | ICD-10-CM

## 2021-07-13 DIAGNOSIS — E669 Obesity, unspecified: Secondary | ICD-10-CM

## 2021-07-13 MED ORDER — MONTELUKAST SODIUM 10 MG PO TABS: 10.0000 mg | ORAL_TABLET | Freq: Every day | ORAL | 0 refills | Status: DC

## 2021-07-13 MED ORDER — BUPROPION HCL ER (SR) 100 MG PO TB12: ORAL_TABLET | ORAL | 0 refills | Status: DC

## 2021-07-13 MED ORDER — VITAMIN D (ERGOCALCIFEROL) 1.25 MG (50000 UNIT) PO CAPS: ORAL_CAPSULE | ORAL | 0 refills | Status: DC

## 2021-07-13 NOTE — Progress Notes (Signed)
Chief Complaint:   OBESITY [name] is here to discuss her progress with her obesity treatment plan along with follow-up of her obesity related diagnoses. [name] is on keeping a food journal and adhering to recommended goals of 1500-1700 calories and 100 grams of protein daily and states she is following her eating plan approximately 65% of the time. [name] states she is walking for 15 minutes 2-3 times per week.  Today's visit was #: 13 Starting weight: 277 lbs Starting date: 11/12/2020 Today's weight: 272 lbs Today's date: 07/13/2021 Total lbs lost to date: 5 Total lbs lost since last in-office visit: 0  Interim History: [name] reports that right after her last appointment, she tested positive for COVID and has found it difficult to eat on the plan for 1 week. Then she had birthday celebrations this past weekend, and she drank more than usual. She didn't journal for 3 days, but she did a great job doing so otherwise. She wasn't hitting her goals most days. She is here to review labs today.  Subjective:   1. Other hyperlipidemia [name] has a decrease in LDL and triglycerides, and her HDL has improved from prior. I discussed labs with the patient today.  2. Insulin resistance Taimi's A1c is up a little from 5.4 to 5.6, but still within normal limits. I discussed labs with the patient today.  3. Environmental and seasonal allergies Oscar LaKatelynn Podolak has seasonal allergies that have been more bothersome with the change of weather/season.    4. Vitamin D deficiency [name] is currently taking prescription vitamin D 50,000 IU twice weekly. Her level is at goal. She denies nausea, vomiting or muscle weakness. I discussed labs with the patient today.  5. Other depression with emotional eating [name] is struggling with emotional eating and using food for comfort to the extent that it is negatively impacting her health. She has been working on behavior modification  techniques to help reduce her emotional eating and has been somewhat successful. She shows no sign of suicidal or homicidal ideations.  6. At risk for diabetes mellitus Aliyah is at higher than average risk for developing diabetes due to obesity.   Assessment/Plan:  No orders of the defined types were placed in this encounter.   Medications Discontinued During This Encounter  Medication Reason   Vitamin D, Ergocalciferol, (DRISDOL) 1.25 MG (50000 UNIT) CAPS capsule Reorder   buPROPion ER (WELLBUTRIN SR) 100 MG 12 hr tablet Reorder   montelukast (SINGULAIR) 10 MG tablet Reorder     Meds ordered this encounter  Medications   buPROPion ER (WELLBUTRIN SR) 100 MG 12 hr tablet    Sig: 1 po q am and 1 po q late afternoon    Dispense:  60 tablet    Refill:  0    Ov for rf   montelukast (SINGULAIR) 10 MG tablet    Sig: Take 1 tablet (10 mg total) by mouth at bedtime.    Dispense:  30 tablet    Refill:  0   Vitamin D, Ergocalciferol, (DRISDOL) 1.25 MG (50000 UNIT) CAPS capsule    Sig: 1 po q wed and 1 po q sun    Dispense:  8 capsule    Refill:  0    Please dispense 30 d supply--> 8 tabs each month     1. Other hyperlipidemia Cardiovascular risk and specific lipid/LDL goals reviewed. We discussed several lifestyle modifications today. [name] will continue to work on diet, exercise and weight loss efforts. Orders and  follow up as documented in patient record.   Counseling Intensive lifestyle modifications are the first line treatment for this issue. Dietary changes: Increase soluble fiber. Decrease simple carbohydrates. Exercise changes: Moderate to vigorous-intensity aerobic activity 150 minutes per week if tolerated. Lipid-lowering medications: see documented in medical record.  2. Insulin resistance Anahit was given handouts on insulin resistance and pre-diabetes, as well as metformin. She declines the need for medications at this time, and wishes to focus on diet and  exercise.  3. Environmental and seasonal allergies Ladiamond will continue Singulair, and we will refill for 1 month.  - montelukast (SINGULAIR) 10 MG tablet; Take 1 tablet (10 mg total) by mouth at bedtime.  Dispense: 30 tablet; Refill: 0  4. Vitamin D deficiency Evaline agreed to continue prescription Vitamin D 50,000 IU twice weekly, and we will refill for 1 month. She will follow-up for routine testing of Vitamin D, at least 2-3 times per year to avoid over-replacement.  - Vitamin D, Ergocalciferol, (DRISDOL) 1.25 MG (50000 UNIT) CAPS capsule; 1 po q wed and 1 po q sun  Dispense: 8 capsule; Refill: 0  5. Other depression with emotional eating Behavior modification techniques were discussed today to help Noreen deal with her emotional/non-hunger eating behaviors. We will refill Wellbutrin SR for 1 month. Orders and follow up as documented in patient record.   - buPROPion ER (WELLBUTRIN SR) 100 MG 12 hr tablet; 1 po q am and 1 po q late afternoon  Dispense: 60 tablet; Refill: 0  6. At risk for diabetes mellitus Jestine was given approximately 15 minutes of diabetes education and counseling today. We discussed intensive lifestyle modifications today with an emphasis on weight loss as well as increasing exercise and decreasing simple carbohydrates in her diet. We also reviewed medication options with an emphasis on risk versus benefit of those discussed.   Repetitive spaced learning was employed today to elicit superior memory formation and behavioral change.  7. Obesity with current BMI of 46.7 Dawnyel is currently in the action stage of change. As such, her goal is to continue with weight loss efforts. She has agreed to keeping a food journal and adhering to recommended goals of 1500-1700 calories and 100 grams of protein daily.   Exercise goals: As is.  Behavioral modification strategies: increasing lean protein intake, decreasing simple carbohydrates, and planning for  success.  Aprille has agreed to follow-up with our clinic in 2 to 3 weeks. She was informed of the importance of frequent follow-up visits to maximize her success with intensive lifestyle modifications for her multiple health conditions.   Objective:   Blood pressure 115/77, pulse 87, temperature 98 F (36.7 C), height 5\' 4"  (1.626 m), weight 272 lb (123.4 kg), SpO2 99 %. Body mass index is 46.69 kg/m.  General: Cooperative, alert, well developed, in no acute distress. HEENT: Conjunctivae and lids unremarkable. Cardiovascular: Regular rhythm.  Lungs: Normal work of breathing. Neurologic: No focal deficits.   Lab Results  Component Value Date   CREATININE 0.90 06/29/2021   BUN 14 06/29/2021   NA 141 06/29/2021   K 4.3 06/29/2021   CL 106 06/29/2021   CO2 22 06/29/2021   Lab Results  Component Value Date   ALT 15 06/29/2021   AST 18 06/29/2021   ALKPHOS 111 (H) 06/29/2021   BILITOT <0.2 06/29/2021   Lab Results  Component Value Date   HGBA1C 5.6 06/29/2021   HGBA1C 5.4 11/12/2020   Lab Results  Component Value Date   INSULIN  22.0 06/29/2021   INSULIN 24.2 03/02/2021   INSULIN 18.4 11/12/2020   Lab Results  Component Value Date   TSH 2.150 11/12/2020   Lab Results  Component Value Date   CHOL 181 (H) 06/29/2021   HDL 56 06/29/2021   LDLCALC 107 06/29/2021   TRIG 98 (H) 06/29/2021   CHOLHDL 3.2 06/29/2021   Lab Results  Component Value Date   VD25OH 54.1 06/29/2021   VD25OH 40.7 03/02/2021   VD25OH 20.8 (L) 11/12/2020   Lab Results  Component Value Date   WBC 8.4 11/12/2020   HGB 12.4 11/12/2020   HCT 38.1 11/12/2020   MCV 85 11/12/2020   PLT 269 11/12/2020   No results found for: IRON, TIBC, FERRITIN  Attestation Statements:   Reviewed by clinician on day of visit: allergies, medications, problem list, medical history, surgical history, family history, social history, and previous encounter notes.   Trude Mcburney, am acting as  transcriptionist for [employer], DO.  I have reviewed the above documentation for accuracy and completeness, and I agree with the above. [name], D.O.  The 21st Century Cures Act was signed into law in 2016 which includes the topic of electronic health records.  This provides immediate access to information in MyChart.  This includes consultation notes, operative notes, office notes, lab results and pathology reports.  If you have any questions about what you read please let us know at your next visit so we can discuss your concerns and take corrective action if need be.  We are right here with you.

## 2021-07-13 NOTE — Telephone Encounter (Signed)
Dr.Opalski ?

## 2021-07-27 ENCOUNTER — Other Ambulatory Visit: Payer: Self-pay

## 2021-07-27 ENCOUNTER — Ambulatory Visit (INDEPENDENT_AMBULATORY_CARE_PROVIDER_SITE_OTHER): Payer: 59 | Admitting: Family Medicine

## 2021-07-27 ENCOUNTER — Encounter (INDEPENDENT_AMBULATORY_CARE_PROVIDER_SITE_OTHER): Payer: Self-pay | Admitting: Family Medicine

## 2021-07-27 VITALS — BP 128/89 | HR 96 | Temp 98.3°F | Ht 64.0 in | Wt 266.0 lb

## 2021-07-27 DIAGNOSIS — Z9189 Other specified personal risk factors, not elsewhere classified: Secondary | ICD-10-CM

## 2021-07-27 DIAGNOSIS — E669 Obesity, unspecified: Secondary | ICD-10-CM

## 2021-07-27 DIAGNOSIS — Z6841 Body Mass Index (BMI) 40.0 and over, adult: Secondary | ICD-10-CM

## 2021-07-27 DIAGNOSIS — J3089 Other allergic rhinitis: Secondary | ICD-10-CM | POA: Diagnosis not present

## 2021-07-27 DIAGNOSIS — E559 Vitamin D deficiency, unspecified: Secondary | ICD-10-CM

## 2021-07-27 MED ORDER — VITAMIN D (ERGOCALCIFEROL) 1.25 MG (50000 UNIT) PO CAPS: ORAL_CAPSULE | ORAL | 0 refills | Status: DC

## 2021-07-27 MED ORDER — MONTELUKAST SODIUM 10 MG PO TABS: 10.0000 mg | ORAL_TABLET | Freq: Every day | ORAL | 0 refills | Status: DC

## 2021-07-28 NOTE — Progress Notes (Signed)
Chief Complaint:   OBESITY Molly Cherry is here to discuss her progress with her obesity treatment plan along with follow-up of her obesity related diagnoses. Molly Cherry is on keeping a food journal and adhering to recommended goals of 1500-1700 calories and 100 grams of protein daily and states she is following her eating plan approximately 75% of the time. Talena states she is walking for 15 minutes 3 times per week.  Today's visit was #: 14 Starting weight: 277 lbs Starting date: 11/12/2020 Today's weight: 266 lbs Today's date: 07/27/2021 Total lbs lost to date: 11 Total lbs lost since last in-office visit: 6  Interim History: Molly Cherry had her birthday celebration and she had a lot of junk food around. She then threw it all out and more or less on the plan since. For a couple of days, she didn't journal or she didn't hit her protein goal but she overall did well.  Subjective:   1. Vitamin D deficiency Molly Cherry is currently taking prescription vitamin D 50,000 IU twice weekly. She denies nausea, vomiting or muscle weakness.  2. Environmental and seasonal allergies Molly Cherry has seasonal allergies that have been more bothersome with the change of weather/season.      3. At risk for impaired metabolic function Molly Cherry is at increased risk for impaired metabolic function due to medical history.    Assessment/Plan:  No orders of the defined types were placed in this encounter.   Medications Discontinued During This Encounter  Medication Reason   montelukast (SINGULAIR) 10 MG tablet Reorder   Vitamin D, Ergocalciferol, (DRISDOL) 1.25 MG (50000 UNIT) CAPS capsule Reorder     Meds ordered this encounter  Medications   Vitamin D, Ergocalciferol, (DRISDOL) 1.25 MG (50000 UNIT) CAPS capsule    Sig: 1 po q wed and 1 po q sun    Dispense:  8 capsule    Refill:  0    Please dispense 30 d supply--> 8 tabs each month   montelukast (SINGULAIR) 10 MG tablet    Sig: Take 1  tablet (10 mg total) by mouth at bedtime.    Dispense:  30 tablet    Refill:  0     1. Vitamin D deficiency We will refill prescription Vitamin D for 1 month. Molly Cherry will follow-up for routine testing of Vitamin D, at least 2-3 times per year to avoid over-replacement.  - Vitamin D, Ergocalciferol, (DRISDOL) 1.25 MG (50000 UNIT) CAPS capsule; 1 po q wed and 1 po q sun  Dispense: 8 capsule; Refill: 0  2. Environmental and seasonal allergies Molly Cherry will continue Singulair, and we will refill for 1 month.  - montelukast (SINGULAIR) 10 MG tablet; Take 1 tablet (10 mg total) by mouth at bedtime.  Dispense: 30 tablet; Refill: 0  3. At risk for impaired metabolic function Due to [facility] current state of health and medical condition(s), she is at a significantly higher risk for impaired metabolic function.   At least 9 minutes was spent on counseling Molly Cherry about these concerns today.  This places the patient at a much greater risk to subsequently develop cardio-pulmonary conditions that can negatively affect the patient's quality of life.  I stressed the importance of reversing these risks factors.  The initial goal is to lose at least 5-10% of starting weight to help reduce risk factors.  Counseling:  Intensive lifestyle modifications discussed with Molly Cherry as the most appropriate first line treatment.  she will continue to work on diet, exercise, and weight loss efforts.  We will continue to reassess these conditions on a fairly regular basis in an attempt to decrease the patient's overall morbidity and mortality.  4. Obesity with current BMI of 45.8 [name] is currently in the action stage of change. As such, her goal is to continue with weight loss efforts. She has agreed to keeping a food journal and adhering to recommended goals of 1500-1700 calories and 100 grams of protein daily.   Exercise goals: As is, increase as tolerated. Molly Cherry plans to start lifting weights a few days per  week.  Behavioral modification strategies: increasing lean protein intake, decreasing simple carbohydrates, and avoiding temptations.  Molly Cherry has agreed to follow-up with our clinic in 3 weeks. She was informed of the importance of frequent follow-up visits to maximize her success with intensive lifestyle modifications for her multiple health conditions.   Objective:   Blood pressure 128/89, pulse 96, temperature 98.3 F (36.8 C), height 5\' 4"  (1.626 m), weight 266 lb (120.7 kg), SpO2 99 %. Body mass index is 45.66 kg/m.  General: Cooperative, alert, well developed, in no acute distress. HEENT: Conjunctivae and lids unremarkable. Cardiovascular: Regular rhythm.  Lungs: Normal work of breathing. Neurologic: No focal deficits.   Lab Results  Component Value Date   CREATININE 0.90 06/29/2021   BUN 14 06/29/2021   NA 141 06/29/2021   K 4.3 06/29/2021   CL 106 06/29/2021   CO2 22 06/29/2021   Lab Results  Component Value Date   ALT 15 06/29/2021   AST 18 06/29/2021   ALKPHOS 111 (H) 06/29/2021   BILITOT <0.2 06/29/2021   Lab Results  Component Value Date   HGBA1C 5.6 06/29/2021   HGBA1C 5.4 11/12/2020   Lab Results  Component Value Date   INSULIN 22.0 06/29/2021   INSULIN 24.2 03/02/2021   INSULIN 18.4 11/12/2020   Lab Results  Component Value Date   TSH 2.150 11/12/2020   Lab Results  Component Value Date   CHOL 181 (H) 06/29/2021   HDL 56 06/29/2021   LDLCALC 107 06/29/2021   TRIG 98 (H) 06/29/2021   CHOLHDL 3.2 06/29/2021   Lab Results  Component Value Date   VD25OH 54.1 06/29/2021   VD25OH 40.7 03/02/2021   VD25OH 20.8 (L) 11/12/2020   Lab Results  Component Value Date   WBC 8.4 11/12/2020   HGB 12.4 11/12/2020   HCT 38.1 11/12/2020   MCV 85 11/12/2020   PLT 269 11/12/2020   No results found for: IRON, TIBC, FERRITIN  Attestation Statements:   Reviewed by clinician on day of visit: allergies, medications, problem list, medical history,  surgical history, family history, social history, and previous encounter notes.  11/14/2020, am acting as transcriptionist for Trude Mcburney, DO.  I have reviewed the above documentation for accuracy and completeness, and I agree with the above. [employer]

## 2021-08-02 ENCOUNTER — Other Ambulatory Visit (INDEPENDENT_AMBULATORY_CARE_PROVIDER_SITE_OTHER): Payer: Self-pay | Admitting: Family Medicine

## 2021-08-02 DIAGNOSIS — E559 Vitamin D deficiency, unspecified: Secondary | ICD-10-CM

## 2021-08-03 NOTE — Telephone Encounter (Signed)
LOV w/ Opalski

## 2021-08-16 ENCOUNTER — Other Ambulatory Visit (INDEPENDENT_AMBULATORY_CARE_PROVIDER_SITE_OTHER): Payer: Self-pay | Admitting: Family Medicine

## 2021-08-16 DIAGNOSIS — F3289 Other specified depressive episodes: Secondary | ICD-10-CM

## 2021-08-17 ENCOUNTER — Encounter (INDEPENDENT_AMBULATORY_CARE_PROVIDER_SITE_OTHER): Payer: Self-pay | Admitting: Family Medicine

## 2021-08-17 ENCOUNTER — Ambulatory Visit (INDEPENDENT_AMBULATORY_CARE_PROVIDER_SITE_OTHER): Payer: 59 | Admitting: Family Medicine

## 2021-08-17 ENCOUNTER — Other Ambulatory Visit: Payer: Self-pay

## 2021-08-17 VITALS — BP 130/85 | HR 102 | Temp 97.8°F | Ht 64.0 in | Wt 267.0 lb

## 2021-08-17 DIAGNOSIS — F3289 Other specified depressive episodes: Secondary | ICD-10-CM

## 2021-08-17 DIAGNOSIS — E559 Vitamin D deficiency, unspecified: Secondary | ICD-10-CM | POA: Diagnosis not present

## 2021-08-17 DIAGNOSIS — J3089 Other allergic rhinitis: Secondary | ICD-10-CM | POA: Diagnosis not present

## 2021-08-17 DIAGNOSIS — Z6841 Body Mass Index (BMI) 40.0 and over, adult: Secondary | ICD-10-CM

## 2021-08-17 DIAGNOSIS — E669 Obesity, unspecified: Secondary | ICD-10-CM

## 2021-08-17 DIAGNOSIS — Z9189 Other specified personal risk factors, not elsewhere classified: Secondary | ICD-10-CM

## 2021-08-17 NOTE — Telephone Encounter (Signed)
Dr.Opalski ?

## 2021-08-18 MED ORDER — BUPROPION HCL ER (SR) 100 MG PO TB12: ORAL_TABLET | ORAL | 0 refills | Status: DC

## 2021-08-18 MED ORDER — VITAMIN D (ERGOCALCIFEROL) 1.25 MG (50000 UNIT) PO CAPS: ORAL_CAPSULE | ORAL | 0 refills | Status: DC

## 2021-08-18 MED ORDER — MONTELUKAST SODIUM 10 MG PO TABS: 10.0000 mg | ORAL_TABLET | Freq: Every day | ORAL | 0 refills | Status: DC

## 2021-08-18 NOTE — Progress Notes (Signed)
° ° ° °Chief Complaint:  ° °OBESITY °Molly Cherry is here to discuss her progress with her obesity treatment plan along with follow-up of her obesity related diagnoses. Molly Cherry is on keeping a food journal and adhering to recommended goals of 1500-1700 calories and 100 grams of protein daily and states she is following her eating plan approximately 70% of the time. Molly Cherry states she is walking for 15 minutes 2 times per week. ° °Today's visit was #: 15 °Starting weight: 277 lbs °Starting date: 11/12/2020 °Today's weight: 267 lbs °Today's date: 08/18/2021 °Total lbs lost to date: 10 °Total lbs lost since last in-office visit: 0 ° °Interim History: Molly Cherry is still doing great with tracking but she ate out much more than usual this past week. She was over in calories and under in protein. Prior weeks she met her goals. 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  ° °Subjective:  ° °1. Vitamin D deficiency °Molly Cherry is currently taking prescription vitamin D 50,000 IU twice weekly. She denies nausea, vomiting or muscle weakness. ° °2. Environmental and seasonal allergies °Molly Cherry's symptoms are well controlled currently. Medication compliance is good.  ° °3. Other depression with emotional eating °Molly Cherry's symptoms are well controlled overall. ° °4. At risk for constipation °Molly Cherry is at increased risk for constipation due to inadequate water intake, and increased protein in diet. ° °Assessment/Plan:  °No orders of the defined types were placed in this encounter. ° ° °Medications Discontinued During This Encounter  °Medication Reason  ° buPROPion ER (WELLBUTRIN SR) 100 MG 12 hr tablet Reorder  ° Vitamin D, Ergocalciferol, (DRISDOL) 1.25 MG (50000 UNIT) CAPS capsule Reorder  ° montelukast (SINGULAIR) 10 MG tablet Reorder  °  ° °Meds ordered this encounter  °Medications  ° buPROPion ER (WELLBUTRIN SR) 100 MG 12 hr tablet  °  Sig: 1 po q am and 1 po q late afternoon  °  Dispense:  60 tablet  °   Refill:  0  °  Ov for rf  ° montelukast (SINGULAIR) 10 MG tablet  °  Sig: Take 1 tablet (10 mg total) by mouth at bedtime.  °  Dispense:  30 tablet  °  Refill:  0  ° Vitamin D, Ergocalciferol, (DRISDOL) 1.25 MG (50000 UNIT) CAPS capsule  °  Sig: 1 po q wed and 1 po q sun  °  Dispense:  8 capsule  °  Refill:  0  °  Please dispense 30 d supply--> 8 tabs each month  °  ° °1. Vitamin D deficiency °We will refill prescription Vitamin D 50,000 IU twice weekly for 1 month. Molly Cherry will follow-up for routine testing of Vitamin D, at least 2-3 times per year to avoid over-replacement. ° °2. Environmental and seasonal allergies °Molly Cherry will continue Singulair, and we will refill for 1 month. ° °3. Other depression with emotional eating °Behavior modification techniques were discussed today to help Sherece deal with her emotional/non-hunger eating behaviors. We will refill Wellbutrin SR 100 mg BID for 1 month. Orders and follow up as documented in patient record.  ° °4. At risk for constipation °Molly Cherry is at increased risk for constipation due to inadequate water intake, changes in diet, and/or use of certain medications. Patient was provided with 9 minutes of counseling today regarding this condition and related ones.  We discussed preventative OTC therapies that exist such as MiraLAX, stool softeners, etc.  ° °We also discussed the importance of adequate fiber intake and for   patient to drink 1/2 weight in ounces of water per day, unless told otherwise by a Cardiologist, Nephrologist, or another medical provider.   ° °5. Obesity with current BMI of 45.9 °Molly Cherry is currently in the action stage of change. As such, her goal is to continue with weight loss efforts. She has agreed to the Category 3 Plan or keeping a food journal and adhering to recommended goals of 1500-1700 calories and 100 grams of protein daily.  ° °Exercise goals: Walking 3-4 days per week for 15+ minutes. ° °Behavioral modification  strategies: decreasing eating out and keeping a strict food journal. ° °Molly Cherry has agreed to follow-up with our clinic in 3 to 4 weeks. She was informed of the importance of frequent follow-up visits to maximize her success with intensive lifestyle modifications for her multiple health conditions.  ° °Objective:  ° °Blood pressure 130/85, pulse (!) 102, temperature 97.8 °F (36.6 °C), height 5' 4" (1.626 m), weight 267 lb (121.1 kg), SpO2 98 %. °Body mass index is 45.83 kg/m². ° °General: Cooperative, alert, well developed, in no acute distress. °HEENT: Conjunctivae and lids unremarkable. °Cardiovascular: Regular rhythm.  °Lungs: Normal work of breathing. °Neurologic: No focal deficits.  ° °Lab Results  °Component Value Date  ° CREATININE 0.90 06/29/2021  ° BUN 14 06/29/2021  ° NA 141 06/29/2021  ° K 4.3 06/29/2021  ° CL 106 06/29/2021  ° CO2 22 06/29/2021  ° °Lab Results  °Component Value Date  ° ALT 15 06/29/2021  ° AST 18 06/29/2021  ° ALKPHOS 111 (H) 06/29/2021  ° BILITOT <0.2 06/29/2021  ° °Lab Results  °Component Value Date  ° HGBA1C 5.6 06/29/2021  ° HGBA1C 5.4 11/12/2020  ° °Lab Results  °Component Value Date  ° INSULIN 22.0 06/29/2021  ° INSULIN 24.2 03/02/2021  ° INSULIN 18.4 11/12/2020  ° °Lab Results  °Component Value Date  ° TSH 2.150 11/12/2020  ° °Lab Results  °Component Value Date  ° CHOL 181 (H) 06/29/2021  ° HDL 56 06/29/2021  ° LDLCALC 107 06/29/2021  ° TRIG 98 (H) 06/29/2021  ° CHOLHDL 3.2 06/29/2021  ° °Lab Results  °Component Value Date  ° VD25OH 54.1 06/29/2021  ° VD25OH 40.7 03/02/2021  ° VD25OH 20.8 (L) 11/12/2020  ° °Lab Results  °Component Value Date  ° WBC 8.4 11/12/2020  ° HGB 12.4 11/12/2020  ° HCT 38.1 11/12/2020  ° MCV 85 11/12/2020  ° PLT 269 11/12/2020  ° °No results found for: IRON, TIBC, FERRITIN ° °Attestation Statements:  ° °Reviewed by clinician on day of visit: allergies, medications, problem list, medical history, surgical history, family history, social history, and  previous encounter notes. ° ° °I, Sharon Martin, am acting as transcriptionist for Deborah Opalski, DO. ° °I have reviewed the above documentation for accuracy and completeness, and I agree with the above. -  Deborah J Opalski, D.O. ° °The 21st Century Cures Act was signed into law in 2016 which includes the topic of electronic health records.  This provides immediate access to information in MyChart.  This includes consultation notes, operative notes, office notes, lab results and pathology reports.  If you have any questions about what you read please let us know at your next visit so we can discuss your concerns and take corrective action if need be.  We are right here with you. ° ° °

## 2021-09-06 ENCOUNTER — Other Ambulatory Visit (INDEPENDENT_AMBULATORY_CARE_PROVIDER_SITE_OTHER): Payer: Self-pay | Admitting: Family Medicine

## 2021-09-06 DIAGNOSIS — E559 Vitamin D deficiency, unspecified: Secondary | ICD-10-CM

## 2021-09-07 ENCOUNTER — Encounter (INDEPENDENT_AMBULATORY_CARE_PROVIDER_SITE_OTHER): Payer: Self-pay | Admitting: Family Medicine

## 2021-09-07 ENCOUNTER — Other Ambulatory Visit: Payer: Self-pay

## 2021-09-07 ENCOUNTER — Ambulatory Visit (INDEPENDENT_AMBULATORY_CARE_PROVIDER_SITE_OTHER): Payer: 59 | Admitting: Family Medicine

## 2021-09-07 VITALS — BP 113/73 | HR 86 | Temp 98.1°F | Ht 64.0 in | Wt 269.0 lb

## 2021-09-07 DIAGNOSIS — Z6841 Body Mass Index (BMI) 40.0 and over, adult: Secondary | ICD-10-CM | POA: Diagnosis not present

## 2021-09-07 DIAGNOSIS — Z9189 Other specified personal risk factors, not elsewhere classified: Secondary | ICD-10-CM

## 2021-09-07 DIAGNOSIS — J3089 Other allergic rhinitis: Secondary | ICD-10-CM | POA: Diagnosis not present

## 2021-09-07 DIAGNOSIS — E669 Obesity, unspecified: Secondary | ICD-10-CM

## 2021-09-07 MED ORDER — LEVOCETIRIZINE DIHYDROCHLORIDE 5 MG PO TABS: 5.0000 mg | ORAL_TABLET | Freq: Every evening | ORAL | 0 refills | Status: DC

## 2021-09-07 MED ORDER — MONTELUKAST SODIUM 10 MG PO TABS: 10.0000 mg | ORAL_TABLET | Freq: Every day | ORAL | 0 refills | Status: DC

## 2021-09-07 NOTE — Telephone Encounter (Signed)
Dr.Opalski ?

## 2021-09-08 ENCOUNTER — Other Ambulatory Visit (INDEPENDENT_AMBULATORY_CARE_PROVIDER_SITE_OTHER): Payer: Self-pay | Admitting: Family Medicine

## 2021-09-08 DIAGNOSIS — J3089 Other allergic rhinitis: Secondary | ICD-10-CM

## 2021-09-13 NOTE — Progress Notes (Signed)
? ? ? ?Chief Complaint:  ? ?OBESITY ?Molly Cherry is here to discuss her progress with her obesity treatment plan along with follow-up of her obesity related diagnoses. Molly Cherry is on the Category 3 Plan and keeping a food journal and adhering to recommended goals of 1500-1700 calories and 100 grams of protein daily and states she is following her eating plan approximately 70-75% of the time. 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 ? ?Today's visit was #: 16 ?Starting weight: 277 lbs ?Starting date: 11/12/2020 ?Today's weight: 269 lbs ?Today's date: 09/07/2021 ?Total lbs lost to date: 8 ?Total lbs lost since last in-office visit: 0 ? ?Interim History: Molly Cherry had Spring break for 1 week. She worked the whole time from 730 am to 6 pm. Not following the meal plan well. Stress well controlled. Breakfast and lunch on the plan, but skips dinner or eats out due to feeling exhausted.  ? ?Subjective:  ? ?1. Environmental and seasonal allergies ?Molly Cherry notes increased symptoms this times of year. She has been congested.  ? ?2. At risk for impaired metabolic function ?Molly Cherry is at increased risk for impaired metabolic function due to current nutrition and muscle mass. ? ?Assessment/Plan:  ?No orders of the defined types were placed in this encounter. ? ? ?Medications Discontinued During This Encounter  ?Medication Reason  ? montelukast (SINGULAIR) 10 MG tablet Reorder  ?  ? ?Meds ordered this encounter  ?Medications  ? montelukast (SINGULAIR) 10 MG tablet  ?  Sig: Take 1 tablet (10 mg total) by mouth at bedtime.  ?  Dispense:  30 tablet  ?  Refill:  0  ? levocetirizine (XYZAL) 5 MG tablet  ?  Sig: Take 1 tablet (5 mg total) by mouth every evening.  ?  Dispense:  30 tablet  ?  Refill:  0  ?  ? ?1. Environmental and seasonal allergies ?Molly Cherry agreed to start Xyzal 5 mg daily with no refills, and we will refill Singulair for 1 month. ? ?- montelukast (SINGULAIR) 10 MG  tablet; Take 1 tablet (10 mg total) by mouth at bedtime.  Dispense: 30 tablet; Refill: 0 ?- levocetirizine (XYZAL) 5 MG tablet; Take 1 tablet (5 mg total) by mouth every evening.  Dispense: 30 tablet; Refill: 0 ? ?2. At risk for impaired metabolic function ?Molly Cherry was given approximately 15 minutes of impaired  metabolic function prevention counseling today. We discussed intensive lifestyle modifications today with an emphasis on specific nutrition and exercise instructions and strategies.  ? ?Repetitive spaced learning was employed today to elicit superior memory formation and behavioral change. ? ?3. Obesity with current BMI of 46.2 ?Molly Cherry is currently in the action stage of change. As such, her goal is to continue with weight loss efforts. She has agreed to the Category 3 Plan and keeping a food journal and adhering to recommended goals of 1500-1700 calories and 100 grams of protein daily.  ? ?Bring journal to her next office visit. ? ?Exercise goals: For substantial health benefits, adults should do at least 150 minutes (2 hours and 30 minutes) a week of moderate-intensity, or 75 minutes (1 hour and 15 minutes) a week of vigorous-intensity aerobic physical activity, or an equivalent combination of moderate- and vigorous-intensity aerobic activity. Aerobic activity should be performed in episodes of at least 10 minutes, and preferably, it should be spread throughout the week. ? ?Behavioral modification strategies: meal planning and cooking strategies, avoiding temptations, and keeping a strict food journal. ? ?  Molly Cherry has agreed to follow-up with our clinic in 3 weeks. She was informed of the importance of frequent follow-up visits to maximize her success with intensive lifestyle modifications for her multiple health conditions.  ? ?Objective:  ? ?Blood pressure 113/73, pulse 86, temperature 98.1 ?F (36.7 ?C), height 5\' 4"  (1.626 m), weight 269 lb (122 kg), SpO2 98 %. ?Body mass index is 46.17  kg/m?. ? ?General: Cooperative, alert, well developed, in no acute distress. ?HEENT: Conjunctivae and lids unremarkable. ?Cardiovascular: Regular rhythm.  ?Lungs: Normal work of breathing. ?Neurologic: No focal deficits.  ? ?Lab Results  ?Component Value Date  ? CREATININE 0.90 06/29/2021  ? BUN 14 06/29/2021  ? NA 141 06/29/2021  ? K 4.3 06/29/2021  ? CL 106 06/29/2021  ? CO2 22 06/29/2021  ? ?Lab Results  ?Component Value Date  ? ALT 15 06/29/2021  ? AST 18 06/29/2021  ? ALKPHOS 111 (H) 06/29/2021  ? BILITOT <0.2 06/29/2021  ? ?Lab Results  ?Component Value Date  ? HGBA1C 5.6 06/29/2021  ? HGBA1C 5.4 11/12/2020  ? ?Lab Results  ?Component Value Date  ? INSULIN 22.0 06/29/2021  ? INSULIN 24.2 03/02/2021  ? INSULIN 18.4 11/12/2020  ? ?Lab Results  ?Component Value Date  ? TSH 2.150 11/12/2020  ? ?Lab Results  ?Component Value Date  ? CHOL 181 (H) 06/29/2021  ? HDL 56 06/29/2021  ? LDLCALC 107 06/29/2021  ? TRIG 98 (H) 06/29/2021  ? CHOLHDL 3.2 06/29/2021  ? ?Lab Results  ?Component Value Date  ? VD25OH 54.1 06/29/2021  ? VD25OH 40.7 03/02/2021  ? VD25OH 20.8 (L) 11/12/2020  ? ?Lab Results  ?Component Value Date  ? WBC 8.4 11/12/2020  ? HGB 12.4 11/12/2020  ? HCT 38.1 11/12/2020  ? MCV 85 11/12/2020  ? PLT 269 11/12/2020  ? ?No results found for: IRON, TIBC, FERRITIN ? ?Attestation Statements:  ? ?Reviewed by clinician on day of visit: allergies, medications, problem list, medical history, surgical history, family history, social history, and previous encounter notes. ? ? ?I, 11/14/2020, am acting as transcriptionist for Burt Knack, DO. ? ?I have reviewed the above documentation for accuracy and completeness, and I agree with the above. [employer], D.O. ? ?The 21st Century Cures Act was signed into law in 2016 which includes the topic of electronic health records.  This provides immediate access to information in MyChart.  This includes consultation notes, operative notes, office notes, lab results  and pathology reports.  If you have any questions about what you read please let 2017 know at your next visit so we can discuss your concerns and take corrective action if need be.  We are right here with you. ? ? ?

## 2021-09-21 ENCOUNTER — Encounter (INDEPENDENT_AMBULATORY_CARE_PROVIDER_SITE_OTHER): Payer: Self-pay | Admitting: Family Medicine

## 2021-09-21 ENCOUNTER — Ambulatory Visit (INDEPENDENT_AMBULATORY_CARE_PROVIDER_SITE_OTHER): Payer: 59 | Admitting: Family Medicine

## 2021-09-21 VITALS — BP 120/84 | HR 92 | Temp 98.2°F | Ht 64.0 in | Wt 266.0 lb

## 2021-09-21 DIAGNOSIS — J3089 Other allergic rhinitis: Secondary | ICD-10-CM

## 2021-09-21 DIAGNOSIS — Z6841 Body Mass Index (BMI) 40.0 and over, adult: Secondary | ICD-10-CM | POA: Diagnosis not present

## 2021-09-21 DIAGNOSIS — E559 Vitamin D deficiency, unspecified: Secondary | ICD-10-CM | POA: Diagnosis not present

## 2021-09-21 DIAGNOSIS — E669 Obesity, unspecified: Secondary | ICD-10-CM | POA: Diagnosis not present

## 2021-09-21 DIAGNOSIS — Z9189 Other specified personal risk factors, not elsewhere classified: Secondary | ICD-10-CM

## 2021-09-21 MED ORDER — VITAMIN D (ERGOCALCIFEROL) 1.25 MG (50000 UNIT) PO CAPS: ORAL_CAPSULE | ORAL | 0 refills | Status: DC

## 2021-09-21 MED ORDER — MONTELUKAST SODIUM 10 MG PO TABS: 10.0000 mg | ORAL_TABLET | Freq: Every day | ORAL | 0 refills | Status: DC

## 2021-09-21 MED ORDER — LEVOCETIRIZINE DIHYDROCHLORIDE 5 MG PO TABS: 5.0000 mg | ORAL_TABLET | Freq: Every evening | ORAL | 0 refills | Status: DC

## 2021-09-24 NOTE — Progress Notes (Signed)
? ? ? ?Chief Complaint:  ? ?OBESITY ?Zarriah is here to discuss her progress with her obesity treatment plan along with follow-up of her obesity related diagnoses. Lisabeth is on the Category 3 Plan or keeping a food journal and adhering to recommended goals of 1500-1700 calories and 100 grams of protein daily and states she is following her eating plan approximately 50% of the time. Romie states she is not currently exercising. ? ?Today's visit was #: 17 ?Starting weight: 277 lbs ?Starting date: 11/12/2020 ?Today's weight: 266 lbs ?Today's date: 09/21/2021 ?Total lbs lost to date: 10 ?Total lbs lost since last in-office visit: 3 ? ?Interim History: Janne was on antibiotics for strep throat and currently is on them. Decreased food intake and water etc. Everything tastes horrible with mucus and head cold symptoms as well. She journals or follows Category 3. ? ?Subjective:  ? ?1. Environmental and seasonal allergies ?Rebecah started last office visit, despite diagnosis with strep and tolerating well with no concerns.  ? ?2. Vitamin D deficiency ?No need for labs. Elzada's level was at goal when last checked <3 months ago. Tolerating well and her energy has improved.  ? ?3. At risk for dehydration ?Lanice is at risk for dehydration due to decreased water intake and recent illness with medication use.  ? ?Assessment/Plan:  ?No orders of the defined types were placed in this encounter. ? ? ?Medications Discontinued During This Encounter  ?Medication Reason  ? Vitamin D, Ergocalciferol, (DRISDOL) 1.25 MG (50000 UNIT) CAPS capsule Reorder  ? montelukast (SINGULAIR) 10 MG tablet Reorder  ? levocetirizine (XYZAL) 5 MG tablet Reorder  ?  ? ?Meds ordered this encounter  ?Medications  ? montelukast (SINGULAIR) 10 MG tablet  ?  Sig: Take 1 tablet (10 mg total) by mouth at bedtime.  ?  Dispense:  30 tablet  ?  Refill:  0  ? Vitamin D, Ergocalciferol, (DRISDOL) 1.25 MG (50000 UNIT) CAPS capsule  ?  Sig: 1 po q wed and 1  po q sun  ?  Dispense:  8 capsule  ?  Refill:  0  ?  Please dispense 30 d supply--> 8 tabs each month  ? levocetirizine (XYZAL) 5 MG tablet  ?  Sig: Take 1 tablet (5 mg total) by mouth every evening.  ?  Dispense:  30 tablet  ?  Refill:  0  ?  ? ?1. Environmental and seasonal allergies ?We will refill Xyzal and Singulair for 1 month. Bernardette  is to decrease exposure to allergies, and wash face/netti pot after prolonged exposure.  ? ?- montelukast (SINGULAIR) 10 MG tablet; Take 1 tablet (10 mg total) by mouth at bedtime.  Dispense: 30 tablet; Refill: 0 ?- levocetirizine (XYZAL) 5 MG tablet; Take 1 tablet (5 mg total) by mouth every evening.  Dispense: 30 tablet; Refill: 0 ? ?2. Vitamin D deficiency ?We will refill prescription Vitamin D 50,000 IU twice weekly for 1 month. Jamala will follow-up for routine testing of Vitamin D, at least 2-3 times per year to avoid over-replacement. ? ?- Vitamin D, Ergocalciferol, (DRISDOL) 1.25 MG (50000 UNIT) CAPS capsule; 1 po q wed and 1 po q sun  Dispense: 8 capsule; Refill: 0 ? ?3. At risk for dehydration ?Birttany was given approximately 10 minutes of dehydration prevention counseling today. Magdelene is at risk for dehydration due to weight loss and current medication(s). She was encouraged to hydrate and monitor fluid status to avoid dehydration as weight loss plateaus.  ? ?4. Obesity with current BMI  of 45.7 ?Sherrel is currently in the action stage of change. As such, her goal is to continue with weight loss efforts. She has agreed to the Category 3 Plan or keeping a food journal and adhering to recommended goals of 1500-1700 calories and 100 grams of protein daily.  ? ?Shatia's goals is to meal prep dinner just as she has done better with breakfast and lunch as of lately. Meal prep on Sunday and Monday. ? ?Exercise goals: Starting 10/03/2021, getting a puppy and will walk her 15-20 minutes daily. ? ?Behavioral modification strategies: no skipping meals, planning for  success, and keeping a strict food journal. ? ?Aubrionna has agreed to follow-up with our clinic in 2 to 3 weeks. She was informed of the importance of frequent follow-up visits to maximize her success with intensive lifestyle modifications for her multiple health conditions.  ? ?Objective:  ? ?Blood pressure 120/84, pulse 92, temperature 98.2 ?F (36.8 ?C), height 5\' 4"  (1.626 m), weight 266 lb (120.7 kg), SpO2 99 %. ?Body mass index is 45.66 kg/m?. ? ?General: Cooperative, alert, well developed, in no acute distress. ?HEENT: Conjunctivae and lids unremarkable. ?Cardiovascular: Regular rhythm.  ?Lungs: Normal work of breathing. ?Neurologic: No focal deficits.  ? ?Lab Results  ?Component Value Date  ? CREATININE 0.90 06/29/2021  ? BUN 14 06/29/2021  ? NA 141 06/29/2021  ? K 4.3 06/29/2021  ? CL 106 06/29/2021  ? CO2 22 06/29/2021  ? ?Lab Results  ?Component Value Date  ? ALT 15 06/29/2021  ? AST 18 06/29/2021  ? ALKPHOS 111 (H) 06/29/2021  ? BILITOT <0.2 06/29/2021  ? ?Lab Results  ?Component Value Date  ? HGBA1C 5.6 06/29/2021  ? HGBA1C 5.4 11/12/2020  ? ?Lab Results  ?Component Value Date  ? INSULIN 22.0 06/29/2021  ? INSULIN 24.2 03/02/2021  ? INSULIN 18.4 11/12/2020  ? ?Lab Results  ?Component Value Date  ? TSH 2.150 11/12/2020  ? ?Lab Results  ?Component Value Date  ? CHOL 181 (H) 06/29/2021  ? HDL 56 06/29/2021  ? LDLCALC 107 06/29/2021  ? TRIG 98 (H) 06/29/2021  ? CHOLHDL 3.2 06/29/2021  ? ?Lab Results  ?Component Value Date  ? VD25OH 54.1 06/29/2021  ? VD25OH 40.7 03/02/2021  ? VD25OH 20.8 (L) 11/12/2020  ? ?Lab Results  ?Component Value Date  ? WBC 8.4 11/12/2020  ? HGB 12.4 11/12/2020  ? HCT 38.1 11/12/2020  ? MCV 85 11/12/2020  ? PLT 269 11/12/2020  ? ?No results found for: IRON, TIBC, FERRITIN ? ?Attestation Statements:  ? ?Reviewed by clinician on day of visit: allergies, medications, problem list, medical history, surgical history, family history, social history, and previous encounter notes. ? ? ?I,  11/14/2020, am acting as transcriptionist for Burt Knack, DO. ? ?I have reviewed the above documentation for accuracy and completeness, and I agree with the above. [employer], D.O. ? ?The 21st Century Cures Act was signed into law in 2016 which includes the topic of electronic health records.  This provides immediate access to information in MyChart.  This includes consultation notes, operative notes, office notes, lab results and pathology reports.  If you have any questions about what you read please let 2017 know at your next visit so we can discuss your concerns and take corrective action if need be.  We are right here with you. ? ? ?

## 2021-10-01 ENCOUNTER — Other Ambulatory Visit (INDEPENDENT_AMBULATORY_CARE_PROVIDER_SITE_OTHER): Payer: Self-pay | Admitting: Family Medicine

## 2021-10-01 DIAGNOSIS — J3089 Other allergic rhinitis: Secondary | ICD-10-CM

## 2021-10-12 ENCOUNTER — Encounter (INDEPENDENT_AMBULATORY_CARE_PROVIDER_SITE_OTHER): Payer: Self-pay | Admitting: Family Medicine

## 2021-10-12 ENCOUNTER — Ambulatory Visit (INDEPENDENT_AMBULATORY_CARE_PROVIDER_SITE_OTHER): Payer: 59 | Admitting: Family Medicine

## 2021-10-12 VITALS — BP 136/86 | HR 73 | Temp 97.6°F | Ht 64.0 in | Wt 269.0 lb

## 2021-10-12 DIAGNOSIS — Z9189 Other specified personal risk factors, not elsewhere classified: Secondary | ICD-10-CM

## 2021-10-12 DIAGNOSIS — J302 Other seasonal allergic rhinitis: Secondary | ICD-10-CM

## 2021-10-12 DIAGNOSIS — Z6841 Body Mass Index (BMI) 40.0 and over, adult: Secondary | ICD-10-CM | POA: Diagnosis not present

## 2021-10-12 DIAGNOSIS — F3289 Other specified depressive episodes: Secondary | ICD-10-CM

## 2021-10-12 DIAGNOSIS — E669 Obesity, unspecified: Secondary | ICD-10-CM

## 2021-10-12 MED ORDER — BUPROPION HCL ER (SR) 150 MG PO TB12: ORAL_TABLET | ORAL | 0 refills | Status: DC

## 2021-10-22 NOTE — Progress Notes (Signed)
? ? ? ?Chief Complaint:  ? ?OBESITY ?Molly Cherry is here to discuss her progress with her obesity treatment plan along with follow-up of her obesity related diagnoses. Molly Cherry is on the Category 3 Plan and keeping a food journal and adhering to recommended goals of 1500 to 1700 calories and 100 grams of protein and states she is following her eating plan approximately 75% of the time. Molly Cherry states she is walking 15 minutes 3 times per week. ? ?Today's visit was #: 18 ?Starting weight: 277 lbs ?Starting date: 11/12/2020 ?Today's weight: 269 lb ?Today's date: 10/12/2021 ?Total lbs lost to date: 8 ?Total lbs lost since last in-office visit: 0 ? ?Interim History: Molly Cherry has a lot of stress with college exams and school work. She did more emotional eating. Molly Cherry occasionally didn't eat her proteins and missed journaling approximately 9+ days. She got a new Springer Spaniel puppy with the name of Snickers. ? ?Subjective:  ? ?1. Seasonal allergies ?Molly Cherry is on Xyzal and Singulair. Her symptoms are pretty well controlled now. ? ?2. Other depression with emotional eating ?Molly Cherry only forgot to take her Wellbutrin 3 days on the past 3 weeks or so. ? ?3. At risk for adverse drug reaction ?Molly Cherry is at risk for drug side effects due to increasing the dose today. ? ?Assessment/Plan:  ?No orders of the defined types were placed in this encounter. ? ? ?Medications Discontinued During This Encounter  ?Medication Reason  ? buPROPion ER (WELLBUTRIN SR) 100 MG 12 hr tablet Reorder  ?  ? ?Meds ordered this encounter  ?Medications  ? buPROPion (WELLBUTRIN SR) 150 MG 12 hr tablet  ?  Sig: 1 po q am and 1 po q late afternoon  ?  Dispense:  60 tablet  ?  Refill:  0  ?  Ov for rf  ?  ? ?1. Seasonal allergies ?Molly Cherry agrees to continue her medication as written. ? ?2. Other depression with emotional eating ?Molly Cherry agrees to increase her bupropion to 150mg  twice daily, which is up from 100mg  BID to help with increased  stressors and the increase in emotional eating as of late. Molly Cherry agrees to increase walking as stress management. ? ?- buPROPion (WELLBUTRIN SR) 150 MG 12 hr tablet; 1 po q am and 1 po q late afternoon  Dispense: 60 tablet; Refill: 0 ? ?3. At risk for adverse drug reaction ?Molly Cherry was given approximately 9 minutes of drug side effect counseling today.  We discussed side effect possibility and risk versus benefits. Molly Cherry agreed to the medication and will contact this office if these side effects are intolerable. ? ?Repetitive spaced learning was employed today to elicit superior memory formation and behavioral change.  ? ?4. Obesity with current BMI of 46.3 ?Molly Cherry agrees to journal and to bring in the total calories and protein log to her next visit. She will try not to skip days of journaling. ?Molly Cherry is currently in the action stage of change. As such, her goal is to continue with weight loss efforts. She has agreed to follow the Category 3 plan and keeping a food journal and adhering to recommended goals of 1500 to 1700 calories and 100 grams of protein with no changes  ? ?Exercise goals:Molly Cherry agrees to walk 15 minutes 5 days per week ? ?Behavioral modification strategies: emotional eating strategies, avoiding temptations, and planning for success. ? ?Molly Cherry has agreed to follow-up with our clinic in 3 weeks. She was informed of the importance of frequent follow-up visits to maximize her success with  intensive lifestyle modifications for her multiple health conditions.  ? ?Objective:  ? ?Blood pressure 136/86, pulse 73, temperature 97.6 ?F (36.4 ?C), height 5\' 4"  (1.626 m), weight 269 lb (122 kg), SpO2 99 %. ?Body mass index is 46.17 kg/m?. ? ?General: Cooperative, alert, well developed, in no acute distress. ?HEENT: Conjunctivae and lids unremarkable. ?Cardiovascular: Regular rhythm.  ?Lungs: Normal work of breathing. ?Neurologic: No focal deficits.  ? ?Lab Results  ?Component Value Date  ?  CREATININE 0.90 06/29/2021  ? BUN 14 06/29/2021  ? NA 141 06/29/2021  ? K 4.3 06/29/2021  ? CL 106 06/29/2021  ? CO2 22 06/29/2021  ? ?Lab Results  ?Component Value Date  ? ALT 15 06/29/2021  ? AST 18 06/29/2021  ? ALKPHOS 111 (H) 06/29/2021  ? BILITOT <0.2 06/29/2021  ? ?Lab Results  ?Component Value Date  ? HGBA1C 5.6 06/29/2021  ? HGBA1C 5.4 11/12/2020  ? ?Lab Results  ?Component Value Date  ? INSULIN 22.0 06/29/2021  ? INSULIN 24.2 03/02/2021  ? INSULIN 18.4 11/12/2020  ? ?Lab Results  ?Component Value Date  ? TSH 2.150 11/12/2020  ? ?Lab Results  ?Component Value Date  ? CHOL 181 (H) 06/29/2021  ? HDL 56 06/29/2021  ? LDLCALC 107 06/29/2021  ? TRIG 98 (H) 06/29/2021  ? CHOLHDL 3.2 06/29/2021  ? ?Lab Results  ?Component Value Date  ? VD25OH 54.1 06/29/2021  ? VD25OH 40.7 03/02/2021  ? VD25OH 20.8 (L) 11/12/2020  ? ?Lab Results  ?Component Value Date  ? WBC 8.4 11/12/2020  ? HGB 12.4 11/12/2020  ? HCT 38.1 11/12/2020  ? MCV 85 11/12/2020  ? PLT 269 11/12/2020  ? ?No results found for: IRON, TIBC, FERRITIN ? ?Attestation Statements:  ? ?Reviewed by clinician on day of visit: allergies, medications, problem list, medical history, surgical history, family history, social history, and previous encounter notes. ? ?I, 11/14/2020, CMA, am acting as transcriptionist for Kirke Corin, DO ? ?I have reviewed the above documentation for accuracy and completeness, and I agree with the above. [employer], D.O. ? ?The 21st Century Cures Act was signed into law in 2016 which includes the topic of electronic health records.  This provides immediate access to information in MyChart.  This includes consultation notes, operative notes, office notes, lab results and pathology reports.  If you have any questions about what you read please let 2017 know at your next visit so we can discuss your concerns and take corrective action if need be.  We are right here with you. ? ?

## 2021-10-26 ENCOUNTER — Encounter (INDEPENDENT_AMBULATORY_CARE_PROVIDER_SITE_OTHER): Payer: Self-pay | Admitting: Family Medicine

## 2021-10-26 ENCOUNTER — Ambulatory Visit (INDEPENDENT_AMBULATORY_CARE_PROVIDER_SITE_OTHER): Payer: 59 | Admitting: Family Medicine

## 2021-10-26 VITALS — BP 121/82 | HR 89 | Temp 98.5°F | Ht 64.0 in | Wt 267.0 lb

## 2021-10-26 DIAGNOSIS — E8881 Metabolic syndrome: Secondary | ICD-10-CM | POA: Diagnosis not present

## 2021-10-26 DIAGNOSIS — F3289 Other specified depressive episodes: Secondary | ICD-10-CM

## 2021-10-26 DIAGNOSIS — E559 Vitamin D deficiency, unspecified: Secondary | ICD-10-CM | POA: Diagnosis not present

## 2021-10-26 DIAGNOSIS — J3089 Other allergic rhinitis: Secondary | ICD-10-CM | POA: Diagnosis not present

## 2021-10-26 DIAGNOSIS — Z6841 Body Mass Index (BMI) 40.0 and over, adult: Secondary | ICD-10-CM

## 2021-10-26 DIAGNOSIS — Z9189 Other specified personal risk factors, not elsewhere classified: Secondary | ICD-10-CM

## 2021-10-26 DIAGNOSIS — E669 Obesity, unspecified: Secondary | ICD-10-CM

## 2021-10-26 MED ORDER — BUPROPION HCL ER (SR) 150 MG PO TB12: ORAL_TABLET | ORAL | 0 refills | Status: DC

## 2021-10-26 MED ORDER — VITAMIN D (ERGOCALCIFEROL) 1.25 MG (50000 UNIT) PO CAPS: ORAL_CAPSULE | ORAL | 0 refills | Status: DC

## 2021-10-29 ENCOUNTER — Other Ambulatory Visit (INDEPENDENT_AMBULATORY_CARE_PROVIDER_SITE_OTHER): Payer: Self-pay | Admitting: Family Medicine

## 2021-10-29 DIAGNOSIS — E559 Vitamin D deficiency, unspecified: Secondary | ICD-10-CM

## 2021-10-31 ENCOUNTER — Other Ambulatory Visit (INDEPENDENT_AMBULATORY_CARE_PROVIDER_SITE_OTHER): Payer: Self-pay | Admitting: Family Medicine

## 2021-10-31 DIAGNOSIS — J3089 Other allergic rhinitis: Secondary | ICD-10-CM

## 2021-11-03 NOTE — Progress Notes (Signed)
? ? ? ?Chief Complaint:  ? ?OBESITY ?Molly Cherry is here to discuss her progress with her obesity treatment plan along with follow-up of her obesity related diagnoses. Anetha is on the Category 3 Plan and keeping a food journal and adhering to recommended goals of 1500-1700 calories and 100 gram of  protein and states she is following her eating plan approximately 75% of the time. Jamanda states she is walking for 20-30 minutes 4 times per week. ? ?Today's visit was #: 19 ?Starting weight: 277 lbs ?Starting date: 11/12/2020 ?Today's weight: 267 lbs ?Today's date: 10/26/2021 ?Total lbs lost to date: 10 lbs ?Total lbs lost since last in-office visit: 2 lbs ? ?Interim History: Jull is doing 25 minutes walks per day with new puppy. She did better with meal prep. She did eat out as well. She didn't journal though and skips some food/meals.  ? ?Subjective:  ? ?1. Insulin resistance ?Phylisha has carbohydrates cravings especially when not eating on plan.  ? ?2. Environmental and seasonal allergies ?Sonali's allergy symptoms are well controlled. She is off medications and now with no concerns. Summer is usually no symptoms. She will hold off on taking medications until the fall time. Preventive strategies due with her. ? ?3. Other depression with emotional eating ?We had to increase Wellbutrin  from 100 mg to 150 mg twice daily. She is sleeping well with no issues at all. Her mood is very good despite finals lately at college. ? ?4. Vitamin D deficiency ?Verneil Arrell is tolerating medication(s) well without side effects.  Medication compliance is good as patient endorses taking it as prescribed.  The patient denies additional concerns regarding this condition.     ? ?5. At risk for deficient intake of food ?Hadden is at risk for deficient intake of food due to skipping meals.  ? ?Assessment/Plan:  ?No orders of the defined types were placed in this encounter. ? ? ?Medications Discontinued During This Encounter   ?Medication Reason  ? buPROPion (WELLBUTRIN SR) 150 MG 12 hr tablet Reorder  ? Vitamin D, Ergocalciferol, (DRISDOL) 1.25 MG (50000 UNIT) CAPS capsule Reorder  ?  ? ?Meds ordered this encounter  ?Medications  ? buPROPion (WELLBUTRIN SR) 150 MG 12 hr tablet  ?  Sig: 1 po q am and 1 po q late afternoon  ?  Dispense:  60 tablet  ?  Refill:  0  ?  Ov for rf  ? Vitamin D, Ergocalciferol, (DRISDOL) 1.25 MG (50000 UNIT) CAPS capsule  ?  Sig: 1 po q wed and 1 po q sun  ?  Dispense:  8 capsule  ?  Refill:  0  ?  Please dispense 30 d supply--> 8 tabs each month  ?  ? ?1. Insulin resistance ?Caleyah was provided handouts on Metformin and Insulin resistance again. Disease counseling done. She will read up on Metformin and we will again discuss the medications at next office visit. She declines them today. She will continue to work on weight loss, exercise, and decreasing simple carbohydrates to help decrease the risk of diabetes. Alazia agreed to follow-up with [place] as directed to closely monitor her progress. ? ?2. Environmental and seasonal allergies ?Brixton will start sinus rinses at night.  ? ?3. Other depression with emotional eating ?Behavior modification techniques were discussed today to help Giliana deal with her emotional/non-hunger eating behaviors.  Autie will continue with bupropion at current dose. Her mood is stable. We will refill Wellbutrin SR 150 mg for 1 month with no refills.  Orders and follow up as documented in patient record.  ? ?- buPROPion (WELLBUTRIN SR) 150 MG 12 hr tablet; 1 po q am and 1 po q late afternoon  Dispense: 60 tablet; Refill: 0 ? ?4. Vitamin D deficiency ?Low Vitamin D level contributes to fatigue and are associated with obesity, breast, and colon cancer. We will refill prescription Vitamin D 50,000 IU every week for 1 month with no refills and Winta will follow-up for routine testing of Vitamin D, at least 2-3 times per year to avoid over-replacement. ? ?- Vitamin D,  Ergocalciferol, (DRISDOL) 1.25 MG (50000 UNIT) CAPS capsule; 1 po q wed and 1 po q sun  Dispense: 8 capsule; Refill: 0 ? ?5. At risk for deficient intake of food ?Chamira was given approximately 15 minutes of deficient intake of food prevention counseling today. Kinzli is at risk for eating too few calories based on current food recall. She was encouraged to focus on meeting caloric and protein goals according to her recommended meal plan.  ? ?6. Obesity with current BMI of 45.9 ?Hannahrose is currently in the action stage of change. As such, her goal is to continue with weight loss efforts. Elica changed to the Category 3 Plan.  ? ?Sayesha will continue to be mindful of eating habits and meal prepping. Handouts on recipes was provided today.  ? ?Exercise goals:  As is.  ? ?Behavioral modification strategies: meal planning and cooking strategies. ? ?Piney has agreed to follow-up with our clinic in 2-3 weeks (fasting labs). She was informed of the importance of frequent follow-up visits to maximize her success with intensive lifestyle modifications for her multiple health conditions.  ? ?Objective:  ? ?Blood pressure 121/82, pulse 89, temperature 98.5 ?F (36.9 ?C), height 5\' 4"  (1.626 m), weight 267 lb (121.1 kg), SpO2 97 %. ?Body mass index is 45.83 kg/m?. ? ?General: Cooperative, alert, well developed, in no acute distress. ?HEENT: Conjunctivae and lids unremarkable. ?Cardiovascular: Regular rhythm.  ?Lungs: Normal work of breathing. ?Neurologic: No focal deficits.  ? ?Lab Results  ?Component Value Date  ? CREATININE 0.90 06/29/2021  ? BUN 14 06/29/2021  ? NA 141 06/29/2021  ? K 4.3 06/29/2021  ? CL 106 06/29/2021  ? CO2 22 06/29/2021  ? ?Lab Results  ?Component Value Date  ? ALT 15 06/29/2021  ? AST 18 06/29/2021  ? ALKPHOS 111 (H) 06/29/2021  ? BILITOT <0.2 06/29/2021  ? ?Lab Results  ?Component Value Date  ? HGBA1C 5.6 06/29/2021  ? HGBA1C 5.4 11/12/2020  ? ?Lab Results  ?Component Value Date  ? INSULIN  22.0 06/29/2021  ? INSULIN 24.2 03/02/2021  ? INSULIN 18.4 11/12/2020  ? ?Lab Results  ?Component Value Date  ? TSH 2.150 11/12/2020  ? ?Lab Results  ?Component Value Date  ? CHOL 181 (H) 06/29/2021  ? HDL 56 06/29/2021  ? [place] 107 06/29/2021  ? TRIG 98 (H) 06/29/2021  ? CHOLHDL 3.2 06/29/2021  ? ?Lab Results  ?Component Value Date  ? VD25OH 54.1 06/29/2021  ? VD25OH 40.7 03/02/2021  ? VD25OH 20.8 (L) 11/12/2020  ? ?Lab Results  ?Component Value Date  ? WBC 8.4 11/12/2020  ? HGB 12.4 11/12/2020  ? HCT 38.1 11/12/2020  ? MCV 85 11/12/2020  ? PLT 269 11/12/2020  ? ?No results found for: IRON, TIBC, FERRITIN ? ?Attestation Statements:  ? ?Reviewed by clinician on day of visit: allergies, medications, problem list, medical history, surgical history, family history, social history, and previous encounter notes. ? ?I, Alisa  White, RMA, am acting as Location manager for [employer], DO. ? ?I have reviewed the above documentation for accuracy and completeness, and I agree with the above. Marjory Sneddon, D.O. ? ?[place] was signed into law in 2016 which includes the topic of electronic health records.  This provides immediate access to information in MyChart.  This includes consultation notes, operative notes, office notes, lab results and pathology reports.  If you have any questions about what you read please let us know at your next visit so we can discuss your concerns and take corrective action if need be.  We are right here with you. ? ?

## 2021-11-09 ENCOUNTER — Encounter (INDEPENDENT_AMBULATORY_CARE_PROVIDER_SITE_OTHER): Payer: Self-pay | Admitting: Family Medicine

## 2021-11-09 ENCOUNTER — Ambulatory Visit (INDEPENDENT_AMBULATORY_CARE_PROVIDER_SITE_OTHER): Payer: 59 | Admitting: Family Medicine

## 2021-11-09 VITALS — BP 115/81 | HR 68 | Temp 98.1°F | Ht 64.0 in | Wt 268.0 lb

## 2021-11-09 DIAGNOSIS — E8881 Metabolic syndrome: Secondary | ICD-10-CM

## 2021-11-09 DIAGNOSIS — E669 Obesity, unspecified: Secondary | ICD-10-CM

## 2021-11-09 DIAGNOSIS — E559 Vitamin D deficiency, unspecified: Secondary | ICD-10-CM | POA: Diagnosis not present

## 2021-11-09 DIAGNOSIS — F3289 Other specified depressive episodes: Secondary | ICD-10-CM

## 2021-11-09 DIAGNOSIS — Z6841 Body Mass Index (BMI) 40.0 and over, adult: Secondary | ICD-10-CM

## 2021-11-09 DIAGNOSIS — Z9189 Other specified personal risk factors, not elsewhere classified: Secondary | ICD-10-CM

## 2021-11-09 MED ORDER — BUPROPION HCL ER (SR) 150 MG PO TB12: ORAL_TABLET | ORAL | 0 refills | Status: DC

## 2021-11-09 MED ORDER — VITAMIN D (ERGOCALCIFEROL) 1.25 MG (50000 UNIT) PO CAPS: ORAL_CAPSULE | ORAL | 0 refills | Status: DC

## 2021-11-10 LAB — VITAMIN B12: Vitamin B-12: 344 pg/mL (ref 232–1245)

## 2021-11-10 LAB — INSULIN, RANDOM: INSULIN: 14.8 u[IU]/mL (ref 2.6–24.9)

## 2021-11-10 LAB — TSH: TSH: 2.75 u[IU]/mL (ref 0.450–4.500)

## 2021-11-10 LAB — HEMOGLOBIN A1C
Est. average glucose Bld gHb Est-mCnc: 103 mg/dL
Hgb A1c MFr Bld: 5.2 % (ref 4.8–5.6)

## 2021-11-10 LAB — T4, FREE: Free T4: 1.22 ng/dL (ref 0.82–1.77)

## 2021-11-10 LAB — VITAMIN D 25 HYDROXY (VIT D DEFICIENCY, FRACTURES): Vit D, 25-Hydroxy: 50.9 ng/mL (ref 30.0–100.0)

## 2021-11-15 NOTE — Progress Notes (Unsigned)
Chief Complaint:   OBESITY Molly Cherry is here to discuss her progress with her obesity treatment plan along with follow-up of her obesity related diagnoses. Molly Cherry is on the Category 3 Plan and states she is following her eating plan approximately 70% of the time. Molly Cherry states she is walking 30 minutes 3 times per week.  Today's visit was #: 20 Starting weight: 277 lbs Starting date: 11/12/2020 Today's weight: 268 lbs Today's date: 11/09/2021 Total lbs lost to date: 9 Total lbs lost since last in-office visit: 0  Interim History: Molly Cherry has been struggling with eating healthy due to finances and the end of the school year stresses. She just ended her menstrual cycle and more emotional eating than usual.  Subjective:   1. Insulin resistance Molly Cherry denies carb cravings or hunger. She admits to emotional eating at times.  2. Vitamin D deficiency She is currently taking prescription vitamin D 50,000 IU each week. She denies nausea, vomiting or muscle weakness.  Lab Results  Component Value Date   VD25OH 50.9 11/09/2021   VD25OH 54.1 06/29/2021   VD25OH 40.7 03/02/2021   3. Other depression with emotional eating Despite Molly Cherry being emotional today, her depression symptoms are well controlled of late. She has been doing emotional eating with menses and stressors.  4. At risk for impaired metabolic function Molly Cherry is at risk of impaired metabolic function due to insulin resistance and obesity.  Assessment/Plan:   Orders Placed This Encounter  Procedures   VITAMIN D 25 Hydroxy (Vit-D Deficiency, Fractures)   Vitamin B12   T4, free   TSH   Hemoglobin A1c   Insulin, random    Medications Discontinued During This Encounter  Medication Reason   Vitamin D, Ergocalciferol, (DRISDOL) 1.25 MG (50000 UNIT) CAPS capsule Reorder   buPROPion (WELLBUTRIN SR) 150 MG 12 hr tablet Reorder     Meds ordered this encounter  Medications   Vitamin D, Ergocalciferol,  (DRISDOL) 1.25 MG (50000 UNIT) CAPS capsule    Sig: 1 po q wed and 1 po q sun    Dispense:  8 capsule    Refill:  0    Please dispense 30 d supply--> 8 tabs each month   buPROPion (WELLBUTRIN SR) 150 MG 12 hr tablet    Sig: 1 po q am and 1 po q late afternoon    Dispense:  60 tablet    Refill:  0    Ov for rf     1. Insulin resistance Labs will be drawn today. Molly Cherry agrees to follow up at directed.  - Hemoglobin A1c - Insulin, random  2. Vitamin D deficiency Molly Cherry's vitamin D level will be checked today. Low Vitamin D level contributes to fatigue and are associated with obesity, breast, and colon cancer. She agrees to continue to take prescription Vitamin D @50 ,000 IU every week and will follow-up for routine testing of Vitamin D, at least 2-3 times per year to avoid over-replacement.  - Vitamin D, Ergocalciferol, (DRISDOL) 1.25 MG (50000 UNIT) CAPS capsule; 1 po q wed and 1 po q sun  Dispense: 8 capsule; Refill: 0 - VITAMIN D 25 Hydroxy (Vit-D Deficiency, Fractures)  3. Other depression with emotional eating Molly Cherry agrees to continue taking Wellbutrin. She declines the need for an increase in her dose. Molly Cherry will consider free counseling at school. Labs were drawn today.  - buPROPion (WELLBUTRIN SR) 150 MG 12 hr tablet; 1 po q am and 1 po q late afternoon  Dispense: 60 tablet;  Refill: 0 - Vitamin B12 - T4, free - TSH  4. At risk for impaired metabolic function Molly Cherry was given approximately 10 minutes of impaired metabolic function prevention counseling today. We discussed intensive lifestyle modifications today with an emphasis on specific nutrition and exercise instructions and strategies.   Repetitive spaced learning was employed today to elicit superior memory formation and behavioral change.   5. Obesity with current BMI of 46.1 I recommended that Molly Cherry journal her intake as she did in the past, which really helps her stay on track. She agrees to follow the  Category 3 plan though.  Molly Cherry is currently in the action stage of change. As such, her goal is to continue with weight loss efforts. She has agreed to the Category 3 Plan.   Exercise goals: Increase exercise to help with the stress of the end of school. For substantial health benefits, adults should do at least 150 minutes (2 hours and 30 minutes) a week of moderate-intensity, or 75 minutes (1 hour and 15 minutes) a week of vigorous-intensity aerobic physical activity, or an equivalent combination of moderate- and vigorous-intensity aerobic activity. Aerobic activity should be performed in episodes of at least 10 minutes, and preferably, it should be spread throughout the week.  Behavioral modification strategies: avoiding temptations.  Molly Cherry has agreed to follow-up with our clinic in 2 to 3 weeks. She was informed of the importance of frequent follow-up visits to maximize her success with intensive lifestyle modifications for her multiple health conditions.   Molly Cherry was informed we would discuss her lab results at her next visit unless there is a critical issue that needs to be addressed sooner. Molly Cherry agreed to keep her next visit at the agreed upon time to discuss these results.  Objective:   Blood pressure 115/81, pulse 68, temperature 98.1 F (36.7 C), height 5\' 4"  (1.626 m), weight 268 lb (121.6 kg), SpO2 97 %. Body mass index is 46 kg/m.  General: Cooperative, alert, well developed, in no acute distress. HEENT: Conjunctivae and lids unremarkable. Cardiovascular: Regular rhythm.  Lungs: Normal work of breathing. Neurologic: No focal deficits.   Lab Results  Component Value Date   CREATININE 0.90 06/29/2021   BUN 14 06/29/2021   NA 141 06/29/2021   K 4.3 06/29/2021   CL 106 06/29/2021   CO2 22 06/29/2021   Lab Results  Component Value Date   ALT 15 06/29/2021   AST 18 06/29/2021   ALKPHOS 111 (H) 06/29/2021   BILITOT <0.2 06/29/2021   Lab Results  Component  Value Date   HGBA1C 5.2 11/09/2021   HGBA1C 5.6 06/29/2021   HGBA1C 5.4 11/12/2020   Lab Results  Component Value Date   INSULIN 14.8 11/09/2021   INSULIN 22.0 06/29/2021   INSULIN 24.2 03/02/2021   INSULIN 18.4 11/12/2020   Lab Results  Component Value Date   TSH 2.750 11/09/2021   Lab Results  Component Value Date   CHOL 181 (H) 06/29/2021   HDL 56 06/29/2021   LDLCALC 107 06/29/2021   TRIG 98 (H) 06/29/2021   CHOLHDL 3.2 06/29/2021   Lab Results  Component Value Date   VD25OH 50.9 11/09/2021   VD25OH 54.1 06/29/2021   VD25OH 40.7 03/02/2021   Lab Results  Component Value Date   WBC 8.4 11/12/2020   HGB 12.4 11/12/2020   HCT 38.1 11/12/2020   MCV 85 11/12/2020   PLT 269 11/12/2020   No results found for: IRON, TIBC, FERRITIN  Attestation Statements:   Reviewed by  clinician on day of visit: allergies, medications, problem list, medical history, surgical history, family history, social history, and previous encounter notes.  IKirke Corin, CMA, am acting as transcriptionist for [employer], DO  I have reviewed the above documentation for accuracy and completeness, and I agree with the above. [name], D.O.  The 21st Century Cures Act was signed into law in 2016 which includes the topic of electronic health records.  This provides immediate access to information in MyChart.  This includes consultation notes, operative notes, office notes, lab results and pathology reports.  If you have any questions about what you read please let us know at your next visit so we can discuss your concerns and take corrective action if need be.  We are right here with you.

## 2021-11-23 ENCOUNTER — Ambulatory Visit (INDEPENDENT_AMBULATORY_CARE_PROVIDER_SITE_OTHER): Payer: 59 | Admitting: Family Medicine

## 2021-11-29 ENCOUNTER — Other Ambulatory Visit (INDEPENDENT_AMBULATORY_CARE_PROVIDER_SITE_OTHER): Payer: Self-pay | Admitting: Family Medicine

## 2021-11-29 DIAGNOSIS — E559 Vitamin D deficiency, unspecified: Secondary | ICD-10-CM

## 2021-11-30 ENCOUNTER — Ambulatory Visit (INDEPENDENT_AMBULATORY_CARE_PROVIDER_SITE_OTHER): Payer: 59 | Admitting: Family Medicine

## 2021-11-30 ENCOUNTER — Encounter (INDEPENDENT_AMBULATORY_CARE_PROVIDER_SITE_OTHER): Payer: Self-pay | Admitting: Family Medicine

## 2021-11-30 VITALS — BP 105/71 | HR 70 | Temp 98.3°F | Ht 64.0 in | Wt 269.0 lb

## 2021-11-30 DIAGNOSIS — E559 Vitamin D deficiency, unspecified: Secondary | ICD-10-CM | POA: Diagnosis not present

## 2021-11-30 DIAGNOSIS — E8881 Metabolic syndrome: Secondary | ICD-10-CM

## 2021-11-30 DIAGNOSIS — E538 Deficiency of other specified B group vitamins: Secondary | ICD-10-CM

## 2021-11-30 DIAGNOSIS — E669 Obesity, unspecified: Secondary | ICD-10-CM

## 2021-11-30 DIAGNOSIS — Z6841 Body Mass Index (BMI) 40.0 and over, adult: Secondary | ICD-10-CM

## 2021-11-30 DIAGNOSIS — Z9189 Other specified personal risk factors, not elsewhere classified: Secondary | ICD-10-CM

## 2021-11-30 MED ORDER — CYANOCOBALAMIN 500 MCG PO TABS: 500.0000 ug | ORAL_TABLET | Freq: Every day | ORAL | Status: DC

## 2021-12-03 NOTE — Progress Notes (Unsigned)
Chief Complaint:   OBESITY Molly Cherry is here to discuss her progress with her obesity treatment plan along with follow-up of her obesity related diagnoses. Molly Cherry is on the Category 3 Plan and states she is following her eating plan approximately 50% of the time. Molly Cherry states she is walking and weights 45 minutes 5 times per week.  Today's visit was #: 21 Starting weight: 277 lbs Starting date: 11/12/2020 Today's weight: 269 lbs Today's date: 11/30/2021 Total lbs lost to date: 8 Total lbs lost since last in-office visit: +1  Interim History: Molly Cherry went out to eat more over the past couple of weeks. She is walking 30  minutes with puppy 5 days a week or more. This weekend, she had increased cravings because she forgot to take her Wellbutrin, but is back on it today.  Subjective:   1. Vitamin D deficiency Discussed labs with patient today. She is currently taking prescription vitamin D 50,000 IU each week. She denies nausea, vomiting or muscle weakness.  2. Insulin resistance Discussed labs with patient today. Goal is HgbA1c < 5.7, fasting insulin closer to 5.    3. B12 deficiency New. Discussed labs with patient today. Molly Cherry has low energy from time to time, more so than prior. She doesn't eat beef, tuna, or salmon.  4. At risk for impaired metabolic function Molly Cherry is at increased risk for impaired metabolic function due to insulin resistance.  Assessment/Plan:  No orders of the defined types were placed in this encounter.   There are no discontinued medications.   Meds ordered this encounter  Medications   vitamin B-12 (CYANOCOBALAMIN) 500 MCG tablet    Sig: Take 1 tablet (500 mcg total) by mouth daily.     1. Vitamin D deficiency Vitamin D at goal at 70. Low Vitamin D level contributes to fatigue and are associated with obesity, breast, and colon cancer. She agrees to continue to take prescription Vitamin D @50 ,000 IU every week and will follow-up for  routine testing of Vitamin D, at least 2-3 times per year to avoid over-replacement.  2. Insulin resistance Molly Cherry's fasting insulin and A1c are decreasing from prior. She will continue to work on weight loss, exercise, and decreasing simple carbohydrates to help decrease the risk of diabetes. Molly Cherry agreed to follow-up with [place] as directed to closely monitor her progress. Continue prudent nutritional plan.  3. B12 deficiency The diagnosis was reviewed with the patient. Counseling provided today, see below. We will continue to monitor. Orders and follow up as documented in patient record. Start OTC B12 500 mcg daily and recheck level in 3 months.  Counseling The body needs vitamin B12: to make red blood cells; to make DNA; and to help the nerves work properly so they can carry messages from the brain to the body.  The main causes of vitamin B12 deficiency include dietary deficiency, digestive diseases, pernicious anemia, and having a surgery in which part of the stomach or small intestine is removed.  Certain medicines can make it harder for the body to absorb vitamin B12. These medicines include: heartburn medications; some antibiotics; some medications used to treat diabetes, gout, and high cholesterol.  In some cases, there are no symptoms of this condition. If the condition leads to anemia or nerve damage, various symptoms can occur, such as weakness or fatigue, shortness of breath, and numbness or tingling in your hands and feet.   Treatment:  May include taking vitamin B12 supplements.  Avoid alcohol.  Eat lots  of healthy foods that contain vitamin B12: Beef, pork, chicken, [place], and organ meats, such as liver.  Seafood: This includes clams, rainbow trout, salmon, tuna, and haddock. Eggs.  Cereal and dairy products that are fortified: This means that vitamin B12 has been added to the food.   Start- vitamin B-12 (CYANOCOBALAMIN) 500 MCG tablet; Take 1 tablet (500 mcg total) by mouth  daily.  4. At risk for impaired metabolic function Due to Molly Cherry current state of health and medical condition(s), she is at a significantly higher risk for impaired metabolic function. At least 9 minutes was spent on counseling Molly Cherry about these concerns today. This places the patient at a much greater risk to subsequently develop cardio-pulmonary conditions that can negatively affect the patient's quality of life.  I stressed the importance of reversing these risks factors. The initial goal is to lose at least 5-10% of starting weight to help reduce risk factors. Counseling: Intensive lifestyle modifications discussed with [name] as the most appropriate first line treatment. She will continue to work on diet, exercise, and weight loss efforts. We will continue to reassess these conditions on a fairly regular basis in an attempt to decrease the patient's overall morbidity and mortality.  5. Obesity with current BMI of 46.2 Molly Cherry is currently in the action stage of change. As such, her goal is to continue with weight loss efforts. She has agreed to the Category 3 Plan.   Exercise goals: For substantial health benefits, adults should do at least 150 minutes (2 hours and 30 minutes) a week of moderate-intensity, or 75 minutes (1 hour and 15 minutes) a week of vigorous-intensity aerobic physical activity, or an equivalent combination of moderate- and vigorous-intensity aerobic activity. Aerobic activity should be performed in episodes of at least 10 minutes, and preferably, it should be spread throughout the week.  Behavioral modification strategies: increasing lean protein intake and decreasing simple carbohydrates.  Molly Cherry has agreed to follow-up with our clinic in 2 weeks. She was informed of the importance of frequent follow-up visits to maximize her success with intensive lifestyle modifications for her multiple health conditions.   Objective:   Blood pressure 105/71, pulse 70,  temperature 98.3 F (36.8 C), height 5\' 4"  (1.626 m), weight 269 lb (122 kg), SpO2 99 %. Body mass index is 46.17 kg/m.  General: Cooperative, alert, well developed, in no acute distress. HEENT: Conjunctivae and lids unremarkable. Cardiovascular: Regular rhythm.  Lungs: Normal work of breathing. Neurologic: No focal deficits.   Lab Results  Component Value Date   CREATININE 0.90 06/29/2021   BUN 14 06/29/2021   NA 141 06/29/2021   K 4.3 06/29/2021   CL 106 06/29/2021   CO2 22 06/29/2021   Lab Results  Component Value Date   ALT 15 06/29/2021   AST 18 06/29/2021   ALKPHOS 111 (H) 06/29/2021   BILITOT <0.2 06/29/2021   Lab Results  Component Value Date   HGBA1C 5.2 11/09/2021   HGBA1C 5.6 06/29/2021   HGBA1C 5.4 11/12/2020   Lab Results  Component Value Date   INSULIN 14.8 11/09/2021   INSULIN 22.0 06/29/2021   INSULIN 24.2 03/02/2021   INSULIN 18.4 11/12/2020   Lab Results  Component Value Date   TSH 2.750 11/09/2021   Lab Results  Component Value Date   CHOL 181 (H) 06/29/2021   HDL 56 06/29/2021   LDLCALC 107 06/29/2021   TRIG 98 (H) 06/29/2021   CHOLHDL 3.2 06/29/2021   Lab Results  Component Value Date  VD25OH 50.9 11/09/2021   VD25OH 54.1 06/29/2021   VD25OH 40.7 03/02/2021   Lab Results  Component Value Date   WBC 8.4 11/12/2020   HGB 12.4 11/12/2020   HCT 38.1 11/12/2020   MCV 85 11/12/2020   PLT 269 11/12/2020    Attestation Statements:   Reviewed by clinician on day of visit: allergies, medications, problem list, medical history, surgical history, family history, social history, and previous encounter notes.  I, Kyung Rudd, BS, CMA, am acting as transcriptionist for [employer], DO.  I have reviewed the above documentation for accuracy and completeness, and I agree with the above. [name], D.O.  The 21st Century Cures Act was signed into law in 2016 which includes the topic of electronic health records.  This  provides immediate access to information in MyChart.  This includes consultation notes, operative notes, office notes, lab results and pathology reports.  If you have any questions about what you read please let us know at your next visit so we can discuss your concerns and take corrective action if need be.  We are right here with you.

## 2021-12-14 ENCOUNTER — Ambulatory Visit (INDEPENDENT_AMBULATORY_CARE_PROVIDER_SITE_OTHER): Payer: 59 | Admitting: Family Medicine

## 2021-12-14 ENCOUNTER — Encounter (INDEPENDENT_AMBULATORY_CARE_PROVIDER_SITE_OTHER): Payer: Self-pay | Admitting: Family Medicine

## 2021-12-14 VITALS — BP 110/72 | HR 60 | Temp 97.8°F | Ht 64.0 in | Wt 267.0 lb

## 2021-12-14 DIAGNOSIS — E538 Deficiency of other specified B group vitamins: Secondary | ICD-10-CM | POA: Diagnosis not present

## 2021-12-14 DIAGNOSIS — J302 Other seasonal allergic rhinitis: Secondary | ICD-10-CM | POA: Diagnosis not present

## 2021-12-14 DIAGNOSIS — E669 Obesity, unspecified: Secondary | ICD-10-CM | POA: Diagnosis not present

## 2021-12-14 DIAGNOSIS — Z6841 Body Mass Index (BMI) 40.0 and over, adult: Secondary | ICD-10-CM | POA: Diagnosis not present

## 2021-12-14 DIAGNOSIS — Z9189 Other specified personal risk factors, not elsewhere classified: Secondary | ICD-10-CM

## 2021-12-14 MED ORDER — LEVOCETIRIZINE DIHYDROCHLORIDE 5 MG PO TABS: 5.0000 mg | ORAL_TABLET | Freq: Every evening | ORAL | 0 refills | Status: DC

## 2021-12-14 MED ORDER — MONTELUKAST SODIUM 10 MG PO TABS: 10.0000 mg | ORAL_TABLET | Freq: Every day | ORAL | 0 refills | Status: DC

## 2021-12-16 NOTE — Progress Notes (Signed)
Chief Complaint:   OBESITY Molly Cherry is here to discuss her progress with her obesity treatment plan along with follow-up of her obesity related diagnoses. Molly Cherry is on the Category 3 Plan and states she is following her eating plan approximately 50% of the time. Molly Cherry states she is not currently exercising.  Today's visit was #: 22 Starting weight: 277 lbs Starting date: 11/12/2020 Today's weight: 267 lbs Today's date: 12/14/2021 Total lbs lost to date: 10 Total lbs lost since last in-office visit: 2  Interim History: Pt has been working 50+ hours a week and is doing very little self-care. She has no time to meal prep or plan. Pt notes she has not been journaling at all and is not hungry, so she skips a lot of foods/meals.  Subjective:   1. Seasonal allergies Pt reports runny nose, sore throat, itchy throat, PNP with mild cough. She has been on meds for a couple of months and needs refills.  2. B12 deficiency Pt has not started B12 supplement yet. She didn't realize she needed to get it OTC.  3. At risk for malnutrition Molly Cherry is at risk for malnutrition due to poor intake.   Assessment/Plan:  No orders of the defined types were placed in this encounter.   Medications Discontinued During This Encounter  Medication Reason   montelukast (SINGULAIR) 10 MG tablet Reorder   levocetirizine (XYZAL) 5 MG tablet Reorder     Meds ordered this encounter  Medications   levocetirizine (XYZAL) 5 MG tablet    Sig: Take 1 tablet (5 mg total) by mouth every evening.    Dispense:  30 tablet    Refill:  0   montelukast (SINGULAIR) 10 MG tablet    Sig: Take 1 tablet (10 mg total) by mouth at bedtime.    Dispense:  30 tablet    Refill:  0     1. Seasonal allergies Continue current treatment plan.  Refill- levocetirizine (XYZAL) 5 MG tablet; Take 1 tablet (5 mg total) by mouth every evening.  Dispense: 30 tablet; Refill: 0 Refill- montelukast (SINGULAIR) 10 MG tablet; Take  1 tablet (10 mg total) by mouth at bedtime.  Dispense: 30 tablet; Refill: 0  2. B12 deficiency The diagnosis was reviewed with the patient. Counseling provided today, see below. We will continue to monitor. Orders and follow up as documented in patient record. I showed pt exactly what to buy on the Internet. Education done.  Counseling The body needs vitamin B12: to make red blood cells; to make DNA; and to help the nerves work properly so they can carry messages from the brain to the body.  The main causes of vitamin B12 deficiency include dietary deficiency, digestive diseases, pernicious anemia, and having a surgery in which part of the stomach or small intestine is removed.  Certain medicines can make it harder for the body to absorb vitamin B12. These medicines include: heartburn medications; some antibiotics; some medications used to treat diabetes, gout, and high cholesterol.  In some cases, there are no symptoms of this condition. If the condition leads to anemia or nerve damage, various symptoms can occur, such as weakness or fatigue, shortness of breath, and numbness or tingling in your hands and feet.   Treatment:  May include taking vitamin B12 supplements.  Avoid alcohol.  Eat lots of healthy foods that contain vitamin B12: Beef, pork, chicken, [place], and organ meats, such as liver.  Seafood: This includes clams, rainbow trout, salmon, tuna, and haddock.  Eggs.  Cereal and dairy products that are fortified: This means that vitamin B12 has been added to the food.   3. At risk for malnutrition Molly Cherry was given extensive malnutrition prevention education and counseling today of more than 15 minutes. Counseled her that malnutrition refers to inappropriate nutrients or not the right balance of nutrients for optimal health. Discussed with Molly Cherry that it is absolutely possible to be malnourished but yet obese. Risk factors, including but not limited to, inappropriate dietary  choices, difficulty with obtaining food due to physical or financial limitations, and various physical and mental health conditions were reviewed with Molly Cherry.   4. Obesity with current BMI of 45.9 Molly Cherry is currently in the action stage of change. As such, her goal is to continue with weight loss efforts. She has agreed to the Category 3 Plan.   Molly Cherry's goal while moving back home in the next month will be to not gain any weight and try to skip less meals.  Exercise goals: All adults should avoid inactivity. Some physical activity is better than none, and adults who participate in any amount of physical activity gain some health benefits.  Behavioral modification strategies: increasing lean protein intake, meal planning and cooking strategies, and planning for success.  Molly Cherry has agreed to follow-up with our clinic in 3 weeks. She was informed of the importance of frequent follow-up visits to maximize her success with intensive lifestyle modifications for her multiple health conditions.   Objective:   Blood pressure 110/72, pulse 60, temperature 97.8 F (36.6 C), height 5\' 4"  (1.626 m), weight 267 lb (121.1 kg), SpO2 99 %. Body mass index is 45.83 kg/m.  General: Cooperative, alert, well developed, in no acute distress. HEENT: Conjunctivae and lids unremarkable. Cardiovascular: Regular rhythm.  Lungs: Normal work of breathing. Neurologic: No focal deficits.   Lab Results  Component Value Date   CREATININE 0.90 06/29/2021   BUN 14 06/29/2021   NA 141 06/29/2021   K 4.3 06/29/2021   CL 106 06/29/2021   CO2 22 06/29/2021   Lab Results  Component Value Date   ALT 15 06/29/2021   AST 18 06/29/2021   ALKPHOS 111 (H) 06/29/2021   BILITOT <0.2 06/29/2021   Lab Results  Component Value Date   HGBA1C 5.2 11/09/2021   HGBA1C 5.6 06/29/2021   HGBA1C 5.4 11/12/2020   Lab Results  Component Value Date   INSULIN 14.8 11/09/2021   INSULIN 22.0 06/29/2021    INSULIN 24.2 03/02/2021   INSULIN 18.4 11/12/2020   Lab Results  Component Value Date   TSH 2.750 11/09/2021   Lab Results  Component Value Date   CHOL 181 (H) 06/29/2021   HDL 56 06/29/2021   LDLCALC 107 06/29/2021   TRIG 98 (H) 06/29/2021   CHOLHDL 3.2 06/29/2021   Lab Results  Component Value Date   VD25OH 50.9 11/09/2021   VD25OH 54.1 06/29/2021   VD25OH 40.7 03/02/2021   Lab Results  Component Value Date   WBC 8.4 11/12/2020   HGB 12.4 11/12/2020   HCT 38.1 11/12/2020   MCV 85 11/12/2020   PLT 269 11/12/2020    Attestation Statements:   Reviewed by clinician on day of visit: allergies, medications, problem list, medical history, surgical history, family history, social history, and previous encounter notes.  I, 11/14/2020, BS, CMA, am acting as transcriptionist for Kyung Rudd, DO.   I have reviewed the above documentation for accuracy and completeness, and I agree with the above. [employer]  Zaela Graley, D.O.  The [place] was signed into law in 2016 which includes the topic of electronic health records.  This provides immediate access to information in MyChart.  This includes consultation notes, operative notes, office notes, lab results and pathology reports.  If you have any questions about what you read please let us know at your next visit so we can discuss your concerns and take corrective action if need be.  We are right here with you.

## 2021-12-22 ENCOUNTER — Other Ambulatory Visit (INDEPENDENT_AMBULATORY_CARE_PROVIDER_SITE_OTHER): Payer: Self-pay | Admitting: Family Medicine

## 2021-12-22 DIAGNOSIS — E559 Vitamin D deficiency, unspecified: Secondary | ICD-10-CM

## 2021-12-28 ENCOUNTER — Other Ambulatory Visit (INDEPENDENT_AMBULATORY_CARE_PROVIDER_SITE_OTHER): Payer: Self-pay | Admitting: Family Medicine

## 2021-12-28 DIAGNOSIS — F3289 Other specified depressive episodes: Secondary | ICD-10-CM

## 2022-01-05 ENCOUNTER — Encounter (INDEPENDENT_AMBULATORY_CARE_PROVIDER_SITE_OTHER): Payer: Self-pay | Admitting: Family Medicine

## 2022-01-05 ENCOUNTER — Other Ambulatory Visit (INDEPENDENT_AMBULATORY_CARE_PROVIDER_SITE_OTHER): Payer: Self-pay | Admitting: Family Medicine

## 2022-01-05 ENCOUNTER — Ambulatory Visit (INDEPENDENT_AMBULATORY_CARE_PROVIDER_SITE_OTHER): Payer: 59 | Admitting: Family Medicine

## 2022-01-05 VITALS — BP 125/88 | HR 96 | Temp 97.9°F | Ht 64.0 in | Wt 274.0 lb

## 2022-01-05 DIAGNOSIS — Z9189 Other specified personal risk factors, not elsewhere classified: Secondary | ICD-10-CM

## 2022-01-05 DIAGNOSIS — E669 Obesity, unspecified: Secondary | ICD-10-CM | POA: Diagnosis not present

## 2022-01-05 DIAGNOSIS — Z6841 Body Mass Index (BMI) 40.0 and over, adult: Secondary | ICD-10-CM | POA: Diagnosis not present

## 2022-01-05 DIAGNOSIS — E559 Vitamin D deficiency, unspecified: Secondary | ICD-10-CM | POA: Diagnosis not present

## 2022-01-05 DIAGNOSIS — J302 Other seasonal allergic rhinitis: Secondary | ICD-10-CM | POA: Diagnosis not present

## 2022-01-05 MED ORDER — MONTELUKAST SODIUM 10 MG PO TABS: 10.0000 mg | ORAL_TABLET | Freq: Every day | ORAL | 0 refills | Status: DC

## 2022-01-05 MED ORDER — LEVOCETIRIZINE DIHYDROCHLORIDE 5 MG PO TABS
5.0000 mg | ORAL_TABLET | Freq: Every evening | ORAL | 0 refills | Status: DC
Start: 2022-01-05 — End: 2022-02-25

## 2022-01-05 MED ORDER — VITAMIN D (ERGOCALCIFEROL) 1.25 MG (50000 UNIT) PO CAPS: ORAL_CAPSULE | ORAL | 0 refills | Status: DC

## 2022-01-10 DIAGNOSIS — J302 Other seasonal allergic rhinitis: Secondary | ICD-10-CM | POA: Insufficient documentation

## 2022-01-10 DIAGNOSIS — Z9189 Other specified personal risk factors, not elsewhere classified: Secondary | ICD-10-CM | POA: Insufficient documentation

## 2022-01-10 NOTE — Progress Notes (Unsigned)
Chief Complaint:   OBESITY Molly Cherry is here to discuss her progress with her obesity treatment plan along with follow-up of her obesity related diagnoses. Molly Cherry is on the Category 3 Plan and states she is following her eating plan approximately 50% of the time. Molly Cherry states she is not exercising.  Today's visit was #: 23 Starting weight: 277 lbs Starting date: 11/12/2020 Today's weight: 267 lbs Today's date: 01/05/2022 Total lbs lost to date: 10 lbs Total lbs lost since last in-office visit: +4  Interim History: Molly Cherry is moving back home from [place] to [place].  Being in between both homes and traveling and  eating out more.  No time to journal or meal prep at this time.  Molly Cherry is not surprised that she gained.  Subjective:   1. Seasonal allergies Symptoms are very well controlled on current medicatons.  2. Vitamin D deficiency She is currently taking prescription vitamin D 50,000 IU every Wednesday and Sunday.  She denies nausea, vomiting or muscle weakness.  3. At risk for depression Molly Cherry is at elevated risk of depression due to one or more of the following: family history, significant life stressors, medical conditions and/or poor nutrition.  Also, due to recent move and living changes.  She is at an increased risk for depression.   Assessment/Plan:   1. Seasonal allergies Refill- montelukast (SINGULAIR) 10 MG tablet; Take 1 tablet (10 mg total) by mouth at bedtime.  Dispense: 30 tablet; Refill: 0  Refill - levocetirizine (XYZAL) 5 MG tablet; Take 1 tablet (5 mg total) by mouth every evening.  Dispense: 30 tablet; Refill: 0  2. Vitamin D deficiency Low Vitamin D level contributes to fatigue and are associated with obesity, breast, and colon cancer. She agrees to continue to take prescription Vitamin D @50 ,000 IU every Wednesday and Sunday and will follow-up for routine testing of Vitamin D, at least 2-3 times per year to avoid  over-replacement.  Refill- Vitamin D, Ergocalciferol, (DRISDOL) 1.25 MG (50000 UNIT) CAPS capsule; 1 po q wed and 1 po q sun  Dispense: 8 capsule; Refill: 0  3. At risk for depression Molly Cherry was given approximately 9 minutes of depression risk counseling today. She has risk factors for depression including recent move and living changes. We discussed the importance of a healthy work life balance, a healthy relationship with food and a good support system.  Repetitive spaced learning was employed today to elicit superior memory formation and behavioral change.   4. 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  She is not sure if she can make all appointments.  Molly Cherry is currently in the action stage of change. As such, her goal is to continue with weight loss efforts. She has agreed to the Category 3 Plan.   Exercise goals: All adults should avoid inactivity. Some physical activity is better than none, and adults who participate in any amount of physical activity gain some health benefits.  Behavioral modification strategies: increasing lean protein intake and decreasing simple carbohydrates.  Etter has agreed to follow-up with our clinic in 2-3 weeks. She was informed of the importance of frequent follow-up visits to maximize her success with intensive lifestyle modifications for her multiple health conditions.   Objective:   Blood pressure 125/88, pulse 96, temperature 97.9 F (36.6 C), height 5\' 4"  (1.626 m), weight 274 lb (124.3 kg), SpO2 99 %. Body mass index is 47.03 kg/m.  General:  Cooperative, alert, well developed, in no acute distress. HEENT: Conjunctivae and lids unremarkable. Cardiovascular: Regular rhythm.  Lungs: Normal work of breathing. Neurologic: No focal deficits.   Lab Results  Component Value Date   CREATININE 0.90 06/29/2021   BUN 14 06/29/2021   NA 141 06/29/2021   K 4.3 06/29/2021    CL 106 06/29/2021   CO2 22 06/29/2021   Lab Results  Component Value Date   ALT 15 06/29/2021   AST 18 06/29/2021   ALKPHOS 111 (H) 06/29/2021   BILITOT <0.2 06/29/2021   Lab Results  Component Value Date   HGBA1C 5.2 11/09/2021   HGBA1C 5.6 06/29/2021   HGBA1C 5.4 11/12/2020   Lab Results  Component Value Date   INSULIN 14.8 11/09/2021   INSULIN 22.0 06/29/2021   INSULIN 24.2 03/02/2021   INSULIN 18.4 11/12/2020   Lab Results  Component Value Date   TSH 2.750 11/09/2021   Lab Results  Component Value Date   CHOL 181 (H) 06/29/2021   HDL 56 06/29/2021   LDLCALC 107 06/29/2021   TRIG 98 (H) 06/29/2021   CHOLHDL 3.2 06/29/2021   Lab Results  Component Value Date   VD25OH 50.9 11/09/2021   VD25OH 54.1 06/29/2021   VD25OH 40.7 03/02/2021   Lab Results  Component Value Date   WBC 8.4 11/12/2020   HGB 12.4 11/12/2020   HCT 38.1 11/12/2020   MCV 85 11/12/2020   PLT 269 11/12/2020   No results found for: "IRON", "TIBC", "FERRITIN"  Attestation Statements:   Reviewed by clinician on day of visit: allergies, medications, problem list, medical history, surgical history, family history, social history, and previous encounter notes.  I, Malcolm Metro, RMA, am acting as Energy manager for [employer], DO.  I have reviewed the above documentation for accuracy and completeness, and I agree with the above. -  ***

## 2022-01-25 ENCOUNTER — Ambulatory Visit (INDEPENDENT_AMBULATORY_CARE_PROVIDER_SITE_OTHER): Payer: 59 | Admitting: Family Medicine

## 2022-01-25 ENCOUNTER — Encounter (INDEPENDENT_AMBULATORY_CARE_PROVIDER_SITE_OTHER): Payer: Self-pay | Admitting: Family Medicine

## 2022-01-25 VITALS — BP 130/80 | HR 110 | Temp 98.3°F | Ht 64.0 in | Wt 276.0 lb

## 2022-01-25 DIAGNOSIS — Z9189 Other specified personal risk factors, not elsewhere classified: Secondary | ICD-10-CM

## 2022-01-25 DIAGNOSIS — F39 Unspecified mood [affective] disorder: Secondary | ICD-10-CM

## 2022-01-25 DIAGNOSIS — J3089 Other allergic rhinitis: Secondary | ICD-10-CM

## 2022-01-25 DIAGNOSIS — Z6841 Body Mass Index (BMI) 40.0 and over, adult: Secondary | ICD-10-CM

## 2022-01-25 DIAGNOSIS — E559 Vitamin D deficiency, unspecified: Secondary | ICD-10-CM

## 2022-01-25 DIAGNOSIS — E66813 Obesity, class 3: Secondary | ICD-10-CM

## 2022-01-25 DIAGNOSIS — E669 Obesity, unspecified: Secondary | ICD-10-CM

## 2022-01-27 ENCOUNTER — Encounter (INDEPENDENT_AMBULATORY_CARE_PROVIDER_SITE_OTHER): Payer: Self-pay

## 2022-02-01 ENCOUNTER — Other Ambulatory Visit (INDEPENDENT_AMBULATORY_CARE_PROVIDER_SITE_OTHER): Payer: Self-pay | Admitting: Family Medicine

## 2022-02-01 DIAGNOSIS — E559 Vitamin D deficiency, unspecified: Secondary | ICD-10-CM

## 2022-02-01 NOTE — Progress Notes (Unsigned)
Chief Complaint:   OBESITY Molly Cherry is here to discuss her progress with her obesity treatment plan along with follow-up of her obesity related diagnoses. Molly Cherry is on the Category 3 Plan with journaling intake and states she is following her eating plan approximately 25% of the time. Molly Cherry states she is not currently exercising.  Today's visit was #: 24 Starting weight: 277 lbs Starting date: 11/12/2020 Today's weight: 276 lbs Today's date: 01/25/2022 Total lbs lost to date: 1 Total lbs lost since last in-office visit: +2  Interim History: Molly [place] She drank alcohol and ate off plan more. Pt hasn't been journaling. She has been drinking more water because she works outside and it's hot.  Subjective:   1. Environmental and seasonal allergies Pt is on Singulair and Xyzal. Symptom are well controlled, and pt is tolerating them well with no issues.  2. Mood disorder ([name]) Molly Cherry is on Wellbutrin 150 mg BID and tolerating it well. She brought in proof that Wellbutrin prescription has no refills but the pharmacist from [facility], A. Charm Barges, keeps refilling it without an order under my name.  3. Vitamin D deficiency Molly Cherry is tolerating medication(s) well without side effects.  Medication compliance is good as patient endorses taking it as prescribed.  The patient denies additional concerns regarding this condition.      4. At risk for impaired metabolic function Molly Cherry is at risk for impaired metabolic function due to current lifestyle habits.  Assessment/Plan:  No orders of the defined types were placed in this encounter.   There are no discontinued medications.   No orders of the defined types were placed in this encounter.    1. Environmental and seasonal allergies Pt declines need for refills today. She states she has plenty. Continue Xyzal and Singulair daily.  2. Mood disorder ([name]) Mood stable. No need for refills.  If medication continues to get filled without a prescription, I will need to take further actions against pharmacy staff at [facility].  3. Vitamin D deficiency - I again reiterated the importance of vitamin D (as well as calcium) to their health and wellbeing.  - I reviewed possible symptoms of low Vitamin D:  low energy, depressed mood, muscle aches, joint aches, osteoporosis etc. - low Vitamin D levels may be linked to an increased risk of cardiovascular events and even increased risk of cancers- such as colon and breast.  - ideal vitamin D levels reviewed with patient  - I recommend pt take a 50,000 IU weekly prescription vit D - see script below   - Informed patient this may be a lifelong thing, and she was encouraged to continue to take the medicine until told otherwise.    - weight loss will likely improve availability of vitamin D, thus encouraged Molly Cherry to continue with meal plan and their weight loss efforts to further improve this condition.  Thus, we will need to monitor levels regularly (every 3-4 mo on average) to keep levels within normal limits and prevent over supplementation. - pt's questions and concerns regarding this condition addressed.  4. At risk for impaired metabolic function Molly Cherry is at a higher than average risk of impaired function of the liver.  she was given prevention education and counseling today of more than 15 minutes on this condition.  Educated her that elevated liver transaminases with an ALT predominance combined with obesity and insulin resistance is characteristic, but not diagnostic of non-alcoholic fatty liver disease (  NAFLD).  I explained that this would be diagnosed by further tests to include a RUQ [place].  Explained that NAFLD is the 2nd leading cause of liver transplant in adults. Treatment includes weight loss, elimination of sweet drinks, including juice, avoidance of high fructose corn syrup, and exercise.  As always, avoiding alcohol consumption and  hepatotoxic substances such as excess tylenol is also important.   5. Obesity with current BMI of 47.4 Molly Cherry is currently in the action stage of change. As such, her goal is to continue with weight loss efforts. She has agreed to the Category 3 Plan but journal intake.   Goals: Journal food for each meal into [employer] at the end of the day. Take Snickers for longer walks- 15 minutes 5 days a week at 5AM.  Exercise goals: All adults should avoid inactivity. Some physical activity is better than none, and adults who participate in any amount of physical activity gain some health benefits.  Behavioral modification strategies: increasing lean protein intake, decreasing simple carbohydrates, decreasing eating out, meal planning and cooking strategies, planning for success, and keeping a strict food journal.  Molly Cherry has agreed to follow-up with our clinic in 3 weeks. She was informed of the importance of frequent follow-up visits to maximize her success with intensive lifestyle modifications for her multiple health conditions.   Objective:   Blood pressure 130/80, pulse (!) 110, temperature 98.3 F (36.8 C), height 5\' 4"  (1.626 m), weight 276 lb (125.2 kg), SpO2 99 %. Body mass index is 47.38 kg/m.  General: Cooperative, alert, well developed, in no acute distress. HEENT: Conjunctivae and lids unremarkable. Cardiovascular: Regular rhythm.  Lungs: Normal work of breathing. Neurologic: No focal deficits.   Lab Results  Component Value Date   CREATININE 0.90 06/29/2021   BUN 14 06/29/2021   NA 141 06/29/2021   K 4.3 06/29/2021   CL 106 06/29/2021   CO2 22 06/29/2021   Lab Results  Component Value Date   ALT 15 06/29/2021   AST 18 06/29/2021   ALKPHOS 111 (H) 06/29/2021   BILITOT <0.2 06/29/2021   Lab Results  Component Value Date   HGBA1C 5.2 11/09/2021   HGBA1C 5.6 06/29/2021   HGBA1C 5.4 11/12/2020   Lab Results  Component Value Date   INSULIN 14.8 11/09/2021    INSULIN 22.0 06/29/2021   INSULIN 24.2 03/02/2021   INSULIN 18.4 11/12/2020   Lab Results  Component Value Date   TSH 2.750 11/09/2021   Lab Results  Component Value Date   CHOL 181 (H) 06/29/2021   HDL 56 06/29/2021   LDLCALC 107 06/29/2021   TRIG 98 (H) 06/29/2021   CHOLHDL 3.2 06/29/2021   Lab Results  Component Value Date   VD25OH 50.9 11/09/2021   VD25OH 54.1 06/29/2021   VD25OH 40.7 03/02/2021   Lab Results  Component Value Date   WBC 8.4 11/12/2020   HGB 12.4 11/12/2020   HCT 38.1 11/12/2020   MCV 85 11/12/2020   PLT 269 11/12/2020    Attestation Statements:   Reviewed by clinician on day of visit: allergies, medications, problem list, medical history, surgical history, family history, social history, and previous encounter notes.  I, 11/14/2020, BS, CMA, am acting as transcriptionist for Kyung Rudd, DO.   I have reviewed the above documentation for accuracy and completeness, and I agree with the above. [employer], D.O.  The 21st Century Cures Act was signed into law in 2016 which includes the topic of electronic health records.  This provides immediate access to information in MyChart.  This includes consultation notes, operative notes, office notes, lab results and pathology reports.  If you have any questions about what you read please let us know at your next visit so we can discuss your concerns and take corrective action if need be.  We are right here with you.

## 2022-02-03 ENCOUNTER — Other Ambulatory Visit (INDEPENDENT_AMBULATORY_CARE_PROVIDER_SITE_OTHER): Payer: Self-pay | Admitting: Family Medicine

## 2022-02-03 DIAGNOSIS — J302 Other seasonal allergic rhinitis: Secondary | ICD-10-CM

## 2022-02-25 ENCOUNTER — Encounter (INDEPENDENT_AMBULATORY_CARE_PROVIDER_SITE_OTHER): Payer: Self-pay | Admitting: Family Medicine

## 2022-02-25 ENCOUNTER — Ambulatory Visit (INDEPENDENT_AMBULATORY_CARE_PROVIDER_SITE_OTHER): Payer: 59 | Admitting: Family Medicine

## 2022-02-25 VITALS — BP 120/82 | HR 101 | Temp 98.7°F | Ht 64.0 in | Wt 272.0 lb

## 2022-02-25 DIAGNOSIS — E559 Vitamin D deficiency, unspecified: Secondary | ICD-10-CM

## 2022-02-25 DIAGNOSIS — J302 Other seasonal allergic rhinitis: Secondary | ICD-10-CM | POA: Diagnosis not present

## 2022-02-25 DIAGNOSIS — Z6841 Body Mass Index (BMI) 40.0 and over, adult: Secondary | ICD-10-CM

## 2022-02-25 DIAGNOSIS — E669 Obesity, unspecified: Secondary | ICD-10-CM

## 2022-02-25 DIAGNOSIS — F3289 Other specified depressive episodes: Secondary | ICD-10-CM

## 2022-02-25 DIAGNOSIS — Z9189 Other specified personal risk factors, not elsewhere classified: Secondary | ICD-10-CM

## 2022-02-25 MED ORDER — LEVOCETIRIZINE DIHYDROCHLORIDE 5 MG PO TABS: 5.0000 mg | ORAL_TABLET | Freq: Every evening | ORAL | 0 refills | Status: DC

## 2022-02-25 MED ORDER — BUPROPION HCL ER (SR) 150 MG PO TB12: ORAL_TABLET | ORAL | 0 refills | Status: DC

## 2022-02-25 MED ORDER — VITAMIN D (ERGOCALCIFEROL) 1.25 MG (50000 UNIT) PO CAPS: ORAL_CAPSULE | ORAL | 0 refills | Status: DC

## 2022-03-05 NOTE — Progress Notes (Signed)
Chief Complaint:   OBESITY Molly Cherry is here to discuss her progress with her obesity treatment plan along with follow-up of her obesity related diagnoses. [name] is on the Category 3 Plan and journal intake and states she is following her eating plan approximately 70% of the time. [name] states she is walking 20 minutes 3 times per week.  Today's visit was #: 25 Starting weight: 277 lbs Starting date: 11/12/2020 Today's weight: 272 lbs Today's date: 02/25/2022 Total lbs lost to date: 5 Total lbs lost since last in-office visit: 4  Interim History: Molly Cherry got back into logging/ journaling lately.  In terms of calories, she is over "by a lot" or under; with proteins, pt struggles most times to get them all in- regularly too low.   Subjective:   1. Vitamin D deficiency She is currently taking prescription vitamin D 50,000 IU twice a week. She denies nausea, vomiting or muscle weakness.  2. Other depression with emotional eating Pt reports she has less emotional eating (barely any now). She is doing great emotionally.  Molly Cherry is tolerating medication(s) well without side effects.  Medication compliance is good as patient endorses taking it as prescribed.  The patient denies additional concerns regarding this condition.      3. Seasonal allergies Symptoms are stable. Pt has no concerns and symptoms are well controlled on current regimen.  4. At risk for impaired metabolic function [name] is at increased risk for impaired metabolic function due to current nutrition and muscle mass.   Assessment/Plan:   Medications Discontinued During This Encounter  Medication Reason   buPROPion (WELLBUTRIN SR) 150 MG 12 hr tablet Reorder   levocetirizine (XYZAL) 5 MG tablet Reorder   Vitamin D, Ergocalciferol, (DRISDOL) 1.25 MG (50000 UNIT) CAPS capsule Reorder     Meds ordered this encounter  Medications   DISCONTD: buPROPion (WELLBUTRIN SR) 150 MG 12 hr tablet    Sig:  1 po q am and 1 po q late afternoon    Dispense:  60 tablet    Refill:  0    Ov for rf   DISCONTD: levocetirizine (XYZAL) 5 MG tablet    Sig: Take 1 tablet (5 mg total) by mouth every evening.    Dispense:  30 tablet    Refill:  0    30 d supply;  ** OV for RF **   Do not send RF request   Vitamin D, Ergocalciferol, (DRISDOL) 1.25 MG (50000 UNIT) CAPS capsule    Sig: 1 po q wed and 1 po q sun    Dispense:  8 capsule    Refill:  0    Please dispense 30 d supply--> 8 tabs each month;  30 d supply;  ** OV for RF **   Do not send RF request     1. Vitamin D deficiency - I again reiterated the importance of vitamin D (as well as calcium) to their health and wellbeing.  - I reviewed possible symptoms of low Vitamin D:  low energy, depressed mood, muscle aches, joint aches, osteoporosis etc. - low Vitamin D levels may be linked to an increased risk of cardiovascular events and even increased risk of cancers- such as colon and breast.  - ideal vitamin D levels reviewed with patient  - I recommend pt take a 50,000 IU twice weekly prescription vit D - see script below   - Informed patient this may be a lifelong thing, and she was encouraged to continue to  take the medicine until told otherwise.    - weight loss will likely improve availability of vitamin D, thus encouraged Miller to continue with meal plan and their weight loss efforts to further improve this condition.  Thus, we will need to monitor levels regularly (every 3-4 mo on average) to keep levels within normal limits and prevent over supplementation. - pt's questions and concerns regarding this condition addressed.  Refill- Vitamin D, Ergocalciferol, (DRISDOL) 1.25 MG (50000 UNIT) CAPS capsule; 1 po q wed and 1 po q sun  Dispense: 8 capsule; Refill: 0  2. Other depression with emotional eating Behavior modification techniques were discussed today to help Molly Cherry deal with her emotional/non-hunger eating behaviors.  Orders and follow  up as documented in patient record.   Refill- buPROPion (WELLBUTRIN SR) 150 MG 12 hr tablet; 1 po q am and 1 po q late afternoon  Dispense: 60 tablet; Refill: 0  3. Seasonal allergies Continue medication and preventative strategies.  Refill- levocetirizine (XYZAL) 5 MG tablet; Take 1 tablet (5 mg total) by mouth every evening.  Dispense: 30 tablet; Refill: 0  4. At risk for impaired metabolic function Due to [facility] current state of health and medical condition(s), she is at a significantly higher risk for impaired metabolic function.   At least 9 minutes was spent on counseling Nyari about these concerns today.  This places the patient at a much greater risk to subsequently develop cardio-pulmonary conditions that can negatively affect the patient's quality of life.  I stressed the importance of reversing these risks factors.  The initial goal is to lose at least 5-10% of starting weight to help reduce risk factors.  Counseling:  Intensive lifestyle modifications discussed with Ivonne as the most appropriate first line treatment.  she will continue to work on diet, exercise, and weight loss efforts.  We will continue to reassess these conditions on a fairly regular basis in an attempt to decrease the patient's overall morbidity and mortality.  5. Obesity with current BMI of 46.7 Molly Cherry is currently in the action stage of change. As such, her goal is to continue with weight loss efforts. She has agreed to the Category 3 Plan but journal intake.   Exercise goals:  Walk or bike 15+ minutes 5 days a week.  Behavioral modification strategies: no skipping meals, planning for success, and keeping a strict food journal.  Molly Cherry has agreed to follow-up with our clinic in 2-3 weeks. She was informed of the importance of frequent follow-up visits to maximize her success with intensive lifestyle modifications for her multiple health conditions.   Objective:   Blood pressure 120/82, pulse (!)  101, temperature 98.7 F (37.1 C), height 5\' 4"  (1.626 m), weight 272 lb (123.4 kg), SpO2 98 %. Body mass index is 46.69 kg/m.  General: Cooperative, alert, well developed, in no acute distress. HEENT: Conjunctivae and lids unremarkable. Cardiovascular: Regular rhythm.  Lungs: Normal work of breathing. Neurologic: No focal deficits.   Lab Results  Component Value Date   CREATININE 0.90 06/29/2021   BUN 14 06/29/2021   NA 141 06/29/2021   K 4.3 06/29/2021   CL 106 06/29/2021   CO2 22 06/29/2021   Lab Results  Component Value Date   ALT 15 06/29/2021   AST 18 06/29/2021   ALKPHOS 111 (H) 06/29/2021   BILITOT <0.2 06/29/2021   Lab Results  Component Value Date   HGBA1C 5.2 11/09/2021   HGBA1C 5.6 06/29/2021   HGBA1C 5.4 11/12/2020   Lab Results  Component Value Date   INSULIN 14.8 11/09/2021   INSULIN 22.0 06/29/2021   INSULIN 24.2 03/02/2021   INSULIN 18.4 11/12/2020   Lab Results  Component Value Date   TSH 2.750 11/09/2021   Lab Results  Component Value Date   CHOL 181 (H) 06/29/2021   HDL 56 06/29/2021   LDLCALC 107 06/29/2021   TRIG 98 (H) 06/29/2021   CHOLHDL 3.2 06/29/2021   Lab Results  Component Value Date   VD25OH 50.9 11/09/2021   VD25OH 54.1 06/29/2021   VD25OH 40.7 03/02/2021   Lab Results  Component Value Date   WBC 8.4 11/12/2020   HGB 12.4 11/12/2020   HCT 38.1 11/12/2020   MCV 85 11/12/2020   PLT 269 11/12/2020    Attestation Statements:   Reviewed by clinician on day of visit: allergies, medications, problem list, medical history, surgical history, family history, social history, and previous encounter notes.  I, Kyung Rudd, BS, CMA, am acting as transcriptionist for [employer], DO.  I have reviewed the above documentation for accuracy and completeness, and I agree with the above. [name], D.O.  The 21st Century Cures Act was signed into law in 2016 which includes the topic of electronic health records.   This provides immediate access to information in MyChart.  This includes consultation notes, operative notes, office notes, lab results and pathology reports.  If you have any questions about what you read please let us know at your next visit so we can discuss your concerns and take corrective action if need be.  We are right here with you.

## 2022-03-15 ENCOUNTER — Encounter (INDEPENDENT_AMBULATORY_CARE_PROVIDER_SITE_OTHER): Payer: Self-pay | Admitting: Family Medicine

## 2022-03-15 ENCOUNTER — Ambulatory Visit (INDEPENDENT_AMBULATORY_CARE_PROVIDER_SITE_OTHER): Payer: 59 | Admitting: Family Medicine

## 2022-03-15 VITALS — BP 130/80 | HR 70 | Temp 97.8°F | Ht 64.0 in | Wt 271.0 lb

## 2022-03-15 DIAGNOSIS — E669 Obesity, unspecified: Secondary | ICD-10-CM

## 2022-03-15 DIAGNOSIS — F3289 Other specified depressive episodes: Secondary | ICD-10-CM | POA: Diagnosis not present

## 2022-03-15 DIAGNOSIS — E538 Deficiency of other specified B group vitamins: Secondary | ICD-10-CM | POA: Diagnosis not present

## 2022-03-15 DIAGNOSIS — E559 Vitamin D deficiency, unspecified: Secondary | ICD-10-CM

## 2022-03-15 DIAGNOSIS — Z6841 Body Mass Index (BMI) 40.0 and over, adult: Secondary | ICD-10-CM

## 2022-03-15 DIAGNOSIS — Z9189 Other specified personal risk factors, not elsewhere classified: Secondary | ICD-10-CM

## 2022-03-15 DIAGNOSIS — J302 Other seasonal allergic rhinitis: Secondary | ICD-10-CM | POA: Diagnosis not present

## 2022-03-15 MED ORDER — BUPROPION HCL ER (SR) 150 MG PO TB12: ORAL_TABLET | ORAL | 0 refills | Status: DC

## 2022-03-15 MED ORDER — LEVOCETIRIZINE DIHYDROCHLORIDE 5 MG PO TABS: 5.0000 mg | ORAL_TABLET | Freq: Every evening | ORAL | 0 refills | Status: DC

## 2022-03-15 MED ORDER — MONTELUKAST SODIUM 10 MG PO TABS: 10.0000 mg | ORAL_TABLET | Freq: Every day | ORAL | 0 refills | Status: DC

## 2022-03-17 ENCOUNTER — Other Ambulatory Visit (INDEPENDENT_AMBULATORY_CARE_PROVIDER_SITE_OTHER): Payer: Self-pay | Admitting: Family Medicine

## 2022-03-17 DIAGNOSIS — E559 Vitamin D deficiency, unspecified: Secondary | ICD-10-CM

## 2022-03-17 NOTE — Progress Notes (Signed)
Chief Complaint:   OBESITY Molly Cherry is here to discuss her progress with her obesity treatment plan along with follow-up of her obesity related diagnoses. Molly Cherry is on the Category 3 Plan and journal intake and states she is following her eating plan approximately 50% of the time. Molly Cherry states she is not currently exercising.  Today's visit was #: 75 Starting weight: 277 lbs Starting date: 11/12/2020 Today's weight: 271 lbs Today's date: 03/15/2022 Total lbs lost to date: 6 Total lbs lost since last in-office visit: 1  Interim History: Molly Cherry's goal was to walk or bike for 15 minutes 5 days a week, but she wasn't able to reach her goal on a regular basis. She plans to focus on that and her journaling.  Subjective:   1. Other depression with emotional eating Symptoms are well controlled, and pt is tolerating meds well.  2. Vitamin D deficiency Molly Cherry is tolerating medication(s) well without side effects.  Medication compliance is good as patient endorses taking it as prescribed.  The patient denies additional concerns regarding this condition.      3. Seasonal allergies Pt reports an increase in symptoms lately.  4. B12 deficiency Molly Cherry takes OTC B12 500 mcg daily and tolerating it well. Level was 344 on last check.  5. At risk for malnutrition Molly Cherry is at risk for malnutrition due to inadequate intake.  Assessment/Plan:  No orders of the defined types were placed in this encounter.   Medications Discontinued During This Encounter  Medication Reason   montelukast (SINGULAIR) 10 MG tablet Reorder   buPROPion (WELLBUTRIN SR) 150 MG 12 hr tablet Reorder   levocetirizine (XYZAL) 5 MG tablet Reorder     Meds ordered this encounter  Medications   buPROPion (WELLBUTRIN SR) 150 MG 12 hr tablet    Sig: 1 po q am and 1 po q late afternoon    Dispense:  60 tablet    Refill:  0    Ov for rf   levocetirizine (XYZAL) 5 MG tablet    Sig: Take 1 tablet  (5 mg total) by mouth every evening.    Dispense:  30 tablet    Refill:  0    30 d supply;  ** OV for RF **   Do not send RF request   montelukast (SINGULAIR) 10 MG tablet    Sig: Take 1 tablet (10 mg total) by mouth at bedtime.    Dispense:  30 tablet    Refill:  0    30 d supply;  ** OV for RF **   Do not send RF request     1. Other depression with emotional eating Behavior modification techniques were discussed today to help Molly Cherry deal with her emotional/non-hunger eating behaviors.  Orders and follow up as documented in patient record. Continue current treatment plan.  Refill- buPROPion (WELLBUTRIN SR) 150 MG 12 hr tablet; 1 po q am and 1 po q late afternoon  Dispense: 60 tablet; Refill: 0  2. Vitamin D deficiency Low Vitamin D level contributes to fatigue and are associated with obesity, breast, and colon cancer. She agrees to continue to take prescription Vitamin D @50 ,000 IU every week and will follow-up for routine testing of Vitamin D, at least 2-3 times per year to avoid over-replacement.  3. Seasonal allergies Continue preventative strategies and use sinus rinses twice a day after exposure.  Refill- levocetirizine (XYZAL) 5 MG tablet; Take 1 tablet (5 mg total) by mouth every evening.  Dispense:  30 tablet; Refill: 0 Refill- montelukast (SINGULAIR) 10 MG tablet; Take 1 tablet (10 mg total) by mouth at bedtime.  Dispense: 30 tablet; Refill: 0  4. B12 deficiency The diagnosis was reviewed with the patient. Counseling provided today, see below. We will continue to monitor. Orders and follow up as documented in patient record. Check labs at next OV.  Counseling The body needs vitamin B12: to make red blood cells; to make DNA; and to help the nerves work properly so they can carry messages from the brain to the body.  The main causes of vitamin B12 deficiency include dietary deficiency, digestive diseases, pernicious anemia, and having a surgery in which part of the stomach or  small intestine is removed.  Certain medicines can make it harder for the body to absorb vitamin B12. These medicines include: heartburn medications; some antibiotics; some medications used to treat diabetes, gout, and high cholesterol.  In some cases, there are no symptoms of this condition. If the condition leads to anemia or nerve damage, various symptoms can occur, such as weakness or fatigue, shortness of breath, and numbness or tingling in your hands and feet.   Treatment:  May include taking vitamin B12 supplements.  Avoid alcohol.  Eat lots of healthy foods that contain vitamin B12: Beef, pork, chicken, [place], and organ meats, such as liver.  Seafood: This includes clams, rainbow trout, salmon, tuna, and haddock. Eggs.  Cereal and dairy products that are fortified: This means that vitamin B12 has been added to the food.   5. At risk for malnutrition Molly Cherry was given extensive malnutrition prevention education and counseling today of more than 9 minutes.  Counseled her that malnutrition refers to inappropriate nutrients or not the right balance of nutrients for optimal health.  Discussed with Molly Cherry that it is absolutely possible to be malnourished but yet obese.  Risk factors, including but not limited to, inappropriate dietary choices, difficulty with obtaining food due to physical or financial limitations, and various physical and mental health conditions were reviewed with Molly Cherry.   6. Obesity with current BMI of 46.6 Molly Cherry is currently in the action stage of change. As such, her goal is to continue with weight loss efforts. She has agreed to the Category 3 Plan and journal intake.   Dining Out Guide given to pt. She will try to plan ahead and choose healthier options when eating fast foods.  Exercise goals: All adults should avoid inactivity. Some physical activity is better than none, and adults who participate in any amount of physical activity gain some  health benefits.  Behavioral modification strategies: increasing lean protein intake and decreasing simple carbohydrates.  Molly Cherry has agreed to follow-up with our clinic in 2-3 weeks. She was informed of the importance of frequent follow-up visits to maximize her success with intensive lifestyle modifications for her multiple health conditions.   Objective:   Blood pressure 130/80, pulse 70, temperature 97.8 F (36.6 C), height 5\' 4"  (1.626 m), weight 271 lb (122.9 kg), SpO2 99 %. Body mass index is 46.52 kg/m.  General: Cooperative, alert, well developed, in no acute distress. HEENT: Conjunctivae and lids unremarkable. Cardiovascular: Regular rhythm.  Lungs: Normal work of breathing. Neurologic: No focal deficits.   Lab Results  Component Value Date   CREATININE 0.90 06/29/2021   BUN 14 06/29/2021   NA 141 06/29/2021   K 4.3 06/29/2021   CL 106 06/29/2021   CO2 22 06/29/2021   Lab Results  Component Value Date  ALT 15 06/29/2021   AST 18 06/29/2021   ALKPHOS 111 (H) 06/29/2021   BILITOT <0.2 06/29/2021   Lab Results  Component Value Date   HGBA1C 5.2 11/09/2021   HGBA1C 5.6 06/29/2021   HGBA1C 5.4 11/12/2020   Lab Results  Component Value Date   INSULIN 14.8 11/09/2021   INSULIN 22.0 06/29/2021   INSULIN 24.2 03/02/2021   INSULIN 18.4 11/12/2020   Lab Results  Component Value Date   TSH 2.750 11/09/2021   Lab Results  Component Value Date   CHOL 181 (H) 06/29/2021   HDL 56 06/29/2021   LDLCALC 107 06/29/2021   TRIG 98 (H) 06/29/2021   CHOLHDL 3.2 06/29/2021   Lab Results  Component Value Date   VD25OH 50.9 11/09/2021   VD25OH 54.1 06/29/2021   VD25OH 40.7 03/02/2021   Lab Results  Component Value Date   WBC 8.4 11/12/2020   HGB 12.4 11/12/2020   HCT 38.1 11/12/2020   MCV 85 11/12/2020   PLT 269 11/12/2020     Attestation Statements:   Reviewed by clinician on day of visit: allergies, medications, problem list, medical history, surgical  history, family history, social history, and previous encounter notes.  I, Kathlene November, BS, CMA, am acting as transcriptionist for [employer], DO.  I have reviewed the above documentation for accuracy and completeness, and I agree with the above. Marjory Sneddon, D.O.  The [place] was signed into law in 2016 which includes the topic of electronic health records.  This provides immediate access to information in MyChart.  This includes consultation notes, operative notes, office notes, lab results and pathology reports.  If you have any questions about what you read please let us know at your next visit so we can discuss your concerns and take corrective action if need be.  We are right here with you.

## 2022-03-19 ENCOUNTER — Other Ambulatory Visit (INDEPENDENT_AMBULATORY_CARE_PROVIDER_SITE_OTHER): Payer: Self-pay | Admitting: Family Medicine

## 2022-03-19 DIAGNOSIS — J302 Other seasonal allergic rhinitis: Secondary | ICD-10-CM

## 2022-03-29 ENCOUNTER — Encounter (INDEPENDENT_AMBULATORY_CARE_PROVIDER_SITE_OTHER): Payer: Self-pay | Admitting: Family Medicine

## 2022-03-29 ENCOUNTER — Ambulatory Visit (INDEPENDENT_AMBULATORY_CARE_PROVIDER_SITE_OTHER): Payer: 59 | Admitting: Family Medicine

## 2022-03-29 VITALS — BP 124/82 | HR 90 | Temp 98.2°F | Ht 64.0 in | Wt 273.0 lb

## 2022-03-29 DIAGNOSIS — E669 Obesity, unspecified: Secondary | ICD-10-CM | POA: Diagnosis not present

## 2022-03-29 DIAGNOSIS — Z6841 Body Mass Index (BMI) 40.0 and over, adult: Secondary | ICD-10-CM

## 2022-03-29 DIAGNOSIS — E538 Deficiency of other specified B group vitamins: Secondary | ICD-10-CM | POA: Diagnosis not present

## 2022-03-29 DIAGNOSIS — J302 Other seasonal allergic rhinitis: Secondary | ICD-10-CM | POA: Diagnosis not present

## 2022-03-29 MED ORDER — MONTELUKAST SODIUM 10 MG PO TABS: 10.0000 mg | ORAL_TABLET | Freq: Every day | ORAL | 0 refills | Status: DC

## 2022-04-05 NOTE — Progress Notes (Unsigned)
Chief Complaint:   OBESITY Molly Cherry is here to discuss her progress with her obesity treatment plan along with follow-up of her obesity related diagnoses. Molly Cherry is on the Category 3 Plan and journaling intake and states she is following her eating plan approximately 70% of the time. [name] states she is walking 15 minutes 3 times per week.  Today's visit was #: 27 Starting weight: 277 lbs Starting date: 11/12/2020 Today's weight: 273 lbs Today's date: 03/29/2022 Total lbs lost to date: 4 Total lbs lost since last in-office visit: +2  Interim History: Molly Cherry has been doing well. She is trying to focus on increasing her protein intake and eat healthy.  Subjective:   1. Seasonal allergies Symptoms well controlled currently with Singulair and Xyzal.  2. B12 deficiency [name] is taking OTC B12 500 mcg daily.  Assessment/Plan:  No orders of the defined types were placed in this encounter.   Medications Discontinued During This Encounter  Medication Reason   montelukast (SINGULAIR) 10 MG tablet Reorder     Meds ordered this encounter  Medications   montelukast (SINGULAIR) 10 MG tablet    Sig: Take 1 tablet (10 mg total) by mouth at bedtime.    Dispense:  30 tablet    Refill:  0    30 d supply;  ** OV for RF **   Do not send RF request     1. Seasonal allergies Molly Cherry has seasonal allergies that have been more bothersome with the change of weather/season.   Pt takes the prescribed medications regularly which has improved symptoms some and her quality of life.  Tolerating well, denies concerns. Refill prn- see below.    - Seasonal / environmental allergies and pathophysiology of disease process discussed with patient.  - Preventative strategies as first line for management discussed.  I encouraged use of KN or N-95 mask prn as well as sterile saline rinses such as [employer]er Med or AYR sinus rinses to be done once- twice daily and after any prolonged exposure  to the environment or allergen.    It is best to use distilled water or previously boiled water    - If eyes are itchy or irritated feeling when your seasonal allergies get bad, ok to use Naphcon-A over-the-counter eyedrops as needed - Encouraged to shower in evenings if they have any prolonged exposure to allergens during the day  Refill- montelukast (SINGULAIR) 10 MG tablet; Take 1 tablet (10 mg total) by mouth at bedtime.  Dispense: 30 tablet; Refill: 0  2. B12 deficiency The diagnosis was reviewed with the patient. Counseling provided today, see below. We will continue to monitor. Orders and follow up as documented in patient record. Recheck B12 level in the near future.  Continue prudent nutritional plan rich in B12, as well as OTC supplement daily.  Counseling The body needs vitamin B12: to make red blood cells; to make DNA; and to help the nerves work properly so they can carry messages from the brain to the body.  The main causes of vitamin B12 deficiency include dietary deficiency, digestive diseases, pernicious anemia, and having a surgery in which part of the stomach or small intestine is removed.  Certain medicines can make it harder for the body to absorb vitamin B12. These medicines include: heartburn medications; some antibiotics; some medications used to treat diabetes, gout, and high cholesterol.  In some cases, there are no symptoms of this condition. If the condition leads to anemia or nerve damage, various  symptoms can occur, such as weakness or fatigue, shortness of breath, and numbness or tingling in your hands and feet.   Treatment:  May include taking vitamin B12 supplements.  Avoid alcohol.  Eat lots of healthy foods that contain vitamin B12: Beef, pork, chicken, [place], and organ meats, such as liver.  Seafood: This includes clams, rainbow trout, salmon, tuna, and haddock. Eggs.  Cereal and dairy products that are fortified: This means that vitamin B12 has been added  to the food.   3. Obesity with current BMI of 46.9 Molly Cherry is currently in the action stage of change. As such, her goal is to continue with weight loss efforts. She has agreed to the Category 3 Plan.   Repeat IC in 2 OV's from now and obtain fasting blood work (next appt at noon and pt doesn't want to fast until then).  Exercise goals: All adults should avoid inactivity. Some physical activity is better than none, and adults who participate in any amount of physical activity gain some health benefits.  Behavioral modification strategies: avoiding temptations, planning for success, and keeping a strict food journal.  [name] has agreed to follow-up with our clinic in 2 weeks. She was informed of the importance of frequent follow-up visits to maximize her success with intensive lifestyle modifications for her multiple health conditions.   Objective:   Blood pressure 124/82, pulse 90, temperature 98.2 F (36.8 C), height 5\' 4"  (1.626 m), weight 273 lb (123.8 kg), SpO2 100 %. Body mass index is 46.86 kg/m.  General: Cooperative, alert, well developed, in no acute distress. HEENT: Conjunctivae and lids unremarkable. Cardiovascular: Regular rhythm.  Lungs: Normal work of breathing. Neurologic: No focal deficits.   Lab Results  Component Value Date   CREATININE 0.90 06/29/2021   BUN 14 06/29/2021   NA 141 06/29/2021   K 4.3 06/29/2021   CL 106 06/29/2021   CO2 22 06/29/2021   Lab Results  Component Value Date   ALT 15 06/29/2021   AST 18 06/29/2021   ALKPHOS 111 (H) 06/29/2021   BILITOT <0.2 06/29/2021   Lab Results  Component Value Date   HGBA1C 5.2 11/09/2021   HGBA1C 5.6 06/29/2021   HGBA1C 5.4 11/12/2020   Lab Results  Component Value Date   INSULIN 14.8 11/09/2021   INSULIN 22.0 06/29/2021   INSULIN 24.2 03/02/2021   INSULIN 18.4 11/12/2020   Lab Results  Component Value Date   TSH 2.750 11/09/2021   Lab Results  Component Value Date   CHOL 181 (H)  06/29/2021   HDL 56 06/29/2021   LDLCALC 107 06/29/2021   TRIG 98 (H) 06/29/2021   CHOLHDL 3.2 06/29/2021   Lab Results  Component Value Date   VD25OH 50.9 11/09/2021   VD25OH 54.1 06/29/2021   VD25OH 40.7 03/02/2021   Lab Results  Component Value Date   WBC 8.4 11/12/2020   HGB 12.4 11/12/2020   HCT 38.1 11/12/2020   MCV 85 11/12/2020   PLT 269 11/12/2020    Attestation Statements:   Reviewed by clinician on day of visit: allergies, medications, problem list, medical history, surgical history, family history, social history, and previous encounter notes.  I, Kathlene November, BS, CMA, am acting as transcriptionist for [employer], DO.   I have reviewed the above documentation for accuracy and completeness, and I agree with the above. Marjory Sneddon, D.O.  The [place] was signed into law in 2016 which includes the topic of electronic health records.  This  provides immediate access to information in MyChart.  This includes consultation notes, operative notes, office notes, lab results and pathology reports.  If you have any questions about what you read please let us know at your next visit so we can discuss your concerns and take corrective action if need be.  We are right here with you.

## 2022-04-12 ENCOUNTER — Encounter (INDEPENDENT_AMBULATORY_CARE_PROVIDER_SITE_OTHER): Payer: Self-pay | Admitting: Family Medicine

## 2022-04-12 ENCOUNTER — Ambulatory Visit (INDEPENDENT_AMBULATORY_CARE_PROVIDER_SITE_OTHER): Payer: 59 | Admitting: Family Medicine

## 2022-04-12 VITALS — BP 121/86 | HR 94 | Temp 98.3°F | Ht 64.0 in | Wt 275.0 lb

## 2022-04-12 DIAGNOSIS — E669 Obesity, unspecified: Secondary | ICD-10-CM

## 2022-04-12 DIAGNOSIS — J302 Other seasonal allergic rhinitis: Secondary | ICD-10-CM | POA: Diagnosis not present

## 2022-04-12 DIAGNOSIS — Z6841 Body Mass Index (BMI) 40.0 and over, adult: Secondary | ICD-10-CM

## 2022-04-12 MED ORDER — LEVOCETIRIZINE DIHYDROCHLORIDE 5 MG PO TABS: 5.0000 mg | ORAL_TABLET | Freq: Every evening | ORAL | 0 refills | Status: DC

## 2022-04-26 NOTE — Progress Notes (Unsigned)
Chief Complaint:   OBESITY Molly Cherry is here to discuss her progress with her obesity treatment plan along with follow-up of her obesity related diagnoses. Rin is on the Category 3 Plan and states she is following her eating plan approximately 20% of the time. Kenley states she is not currently exercising.  Today's visit was #: 34 Starting weight: 277 lbs Starting date: 11/12/2020 Today's weight: 275 lbs Today's date: 04/12/2022 Total lbs lost to date: 2 Total lbs lost since last in-office visit: 0  Interim History: Patient has some upper respiratory infection symptoms today with sore throat, head congestion, runny nose, and dry cough, but no headache or fever.  She tells me its allergy symptoms and she has been taking her allergy medications.  She has not been eating right.  Subjective:   1. Seasonal allergies Patient is tolerating her medications well, but she notes increased upper respiratory infection symptoms.  Assessment/Plan:  No orders of the defined types were placed in this encounter.   Medications Discontinued During This Encounter  Medication Reason   levocetirizine (XYZAL) 5 MG tablet Reorder     Meds ordered this encounter  Medications   levocetirizine (XYZAL) 5 MG tablet    Sig: Take 1 tablet (5 mg total) by mouth every evening.    Dispense:  30 tablet    Refill:  0    30 d supply;  ** OV for RF **   Do not send RF request     1. Seasonal allergies We will refill Xyzal for 1 month.  Patient will continue Singulair and add Flonase as needed, and she will use sinus rinse after exposure.  - levocetirizine (XYZAL) 5 MG tablet; Take 1 tablet (5 mg total) by mouth every evening.  Dispense: 30 tablet; Refill: 0  2. Obesity with current BMI of 47.2 Molly Cherry is currently in the action stage of change. As such, her goal is to continue with weight loss efforts. She has agreed to the Category 3 Plan with breakfast and lunch options.   Goal: Patient will  journal her intake for her next office visit.  Recipe I packet was given to the patent today.   Exercise goals: All adults should avoid inactivity. Some physical activity is better than none, and adults who participate in any amount of physical activity gain some health benefits.  Behavioral modification strategies: planning for success and keeping a strict food journal.  Molly Cherry has agreed to follow-up with our clinic in 2 to 3 weeks. She was informed of the importance of frequent follow-up visits to maximize her success with intensive lifestyle modifications for her multiple health conditions.   Objective:   Blood pressure 121/86, pulse 94, temperature 98.3 F (36.8 C), height 5\' 4"  (1.626 m), weight 275 lb (124.7 kg), SpO2 99 %. Body mass index is 47.2 kg/m.  General: Cooperative, alert, well developed, in no acute distress. HEENT: Conjunctivae and lids unremarkable. Cardiovascular: Regular rhythm.  Lungs: Normal work of breathing. Neurologic: No focal deficits.   Lab Results  Component Value Date   CREATININE 0.90 06/29/2021   BUN 14 06/29/2021   NA 141 06/29/2021   K 4.3 06/29/2021   CL 106 06/29/2021   CO2 22 06/29/2021   Lab Results  Component Value Date   ALT 15 06/29/2021   AST 18 06/29/2021   ALKPHOS 111 (H) 06/29/2021   BILITOT <0.2 06/29/2021   Lab Results  Component Value Date   HGBA1C 5.2 11/09/2021   HGBA1C 5.6 06/29/2021  HGBA1C 5.4 11/12/2020   Lab Results  Component Value Date   INSULIN 14.8 11/09/2021   INSULIN 22.0 06/29/2021   INSULIN 24.2 03/02/2021   INSULIN 18.4 11/12/2020   Lab Results  Component Value Date   TSH 2.750 11/09/2021   Lab Results  Component Value Date   CHOL 181 (H) 06/29/2021   HDL 56 06/29/2021   LDLCALC 107 06/29/2021   TRIG 98 (H) 06/29/2021   CHOLHDL 3.2 06/29/2021   Lab Results  Component Value Date   VD25OH 50.9 11/09/2021   VD25OH 54.1 06/29/2021   VD25OH 40.7 03/02/2021   Lab Results  Component  Value Date   WBC 8.4 11/12/2020   HGB 12.4 11/12/2020   HCT 38.1 11/12/2020   MCV 85 11/12/2020   PLT 269 11/12/2020   No results found for: "IRON", "TIBC", "FERRITIN"  Attestation Statements:   Reviewed by clinician on day of visit: allergies, medications, problem list, medical history, surgical history, family history, social history, and previous encounter notes.   Trude Mcburney, am acting as transcriptionist for [employer], DO.   I have reviewed the above documentation for accuracy and completeness, and I agree with the above. [name], D.O.  The 21st Century Cures Act was signed into law in 2016 which includes the topic of electronic health records.  This provides immediate access to information in MyChart.  This includes consultation notes, operative notes, office notes, lab results and pathology reports.  If you have any questions about what you read please let us know at your next visit so we can discuss your concerns and take corrective action if need be.  We are right here with you.

## 2022-04-28 ENCOUNTER — Encounter (INDEPENDENT_AMBULATORY_CARE_PROVIDER_SITE_OTHER): Payer: Self-pay | Admitting: Family Medicine

## 2022-04-28 ENCOUNTER — Ambulatory Visit (INDEPENDENT_AMBULATORY_CARE_PROVIDER_SITE_OTHER): Payer: 59 | Admitting: Family Medicine

## 2022-04-28 VITALS — BP 132/82 | HR 69 | Temp 98.8°F | Ht 64.0 in | Wt 275.6 lb

## 2022-04-28 DIAGNOSIS — R0602 Shortness of breath: Secondary | ICD-10-CM | POA: Diagnosis not present

## 2022-04-28 DIAGNOSIS — E7849 Other hyperlipidemia: Secondary | ICD-10-CM | POA: Diagnosis not present

## 2022-04-28 DIAGNOSIS — F32A Depression, unspecified: Secondary | ICD-10-CM | POA: Insufficient documentation

## 2022-04-28 DIAGNOSIS — E669 Obesity, unspecified: Secondary | ICD-10-CM

## 2022-04-28 DIAGNOSIS — E559 Vitamin D deficiency, unspecified: Secondary | ICD-10-CM | POA: Diagnosis not present

## 2022-04-28 DIAGNOSIS — E88819 Insulin resistance, unspecified: Secondary | ICD-10-CM

## 2022-04-28 DIAGNOSIS — Z6841 Body Mass Index (BMI) 40.0 and over, adult: Secondary | ICD-10-CM

## 2022-04-28 DIAGNOSIS — F3289 Other specified depressive episodes: Secondary | ICD-10-CM

## 2022-04-28 MED ORDER — BUPROPION HCL ER (SR) 150 MG PO TB12: ORAL_TABLET | ORAL | 0 refills | Status: DC

## 2022-04-28 MED ORDER — VITAMIN D (ERGOCALCIFEROL) 1.25 MG (50000 UNIT) PO CAPS: ORAL_CAPSULE | ORAL | 0 refills | Status: DC

## 2022-04-29 LAB — HEMOGLOBIN A1C
Est. average glucose Bld gHb Est-mCnc: 111 mg/dL
Hgb A1c MFr Bld: 5.5 % (ref 4.8–5.6)

## 2022-04-29 LAB — COMPREHENSIVE METABOLIC PANEL
ALT: 15 IU/L (ref 0–32)
AST: 18 IU/L (ref 0–40)
Albumin/Globulin Ratio: 1.5 (ref 1.2–2.2)
Albumin: 4.3 g/dL (ref 4.0–5.0)
Alkaline Phosphatase: 106 IU/L (ref 42–106)
BUN/Creatinine Ratio: 18 (ref 9–23)
BUN: 15 mg/dL (ref 6–20)
Bilirubin Total: 0.2 mg/dL (ref 0.0–1.2)
CO2: 23 mmol/L (ref 20–29)
Calcium: 9.1 mg/dL (ref 8.7–10.2)
Chloride: 105 mmol/L (ref 96–106)
Creatinine, Ser: 0.82 mg/dL (ref 0.57–1.00)
Globulin, Total: 2.8 g/dL (ref 1.5–4.5)
Glucose: 87 mg/dL (ref 70–99)
Potassium: 4.9 mmol/L (ref 3.5–5.2)
Sodium: 141 mmol/L (ref 134–144)
Total Protein: 7.1 g/dL (ref 6.0–8.5)
eGFR: 105 mL/min/{1.73_m2} (ref >59)

## 2022-04-29 LAB — LIPID PANEL WITH LDL/HDL RATIO
Cholesterol, Total: 175 mg/dL (ref 100–199)
HDL: 59 mg/dL (ref >39)
LDL Chol Calc (NIH): 99 mg/dL (ref 0–99)
LDL/HDL Ratio: 1.7 ratio (ref 0.0–3.2)
Triglycerides: 94 mg/dL (ref 0–149)
VLDL Cholesterol Cal: 17 mg/dL (ref 5–40)

## 2022-04-29 LAB — TSH: TSH: 1.94 u[IU]/mL (ref 0.450–4.500)

## 2022-04-29 LAB — CBC WITH DIFFERENTIAL/PLATELET
Basophils Absolute: 0.1 10*3/uL (ref 0.0–0.2)
Basos: 1 %
EOS (ABSOLUTE): 0.1 10*3/uL (ref 0.0–0.4)
Eos: 1 %
Hematocrit: 39.4 % (ref 34.0–46.6)
Hemoglobin: 12.8 g/dL (ref 11.1–15.9)
Immature Grans (Abs): 0.1 10*3/uL (ref 0.0–0.1)
Immature Granulocytes: 1 %
Lymphocytes Absolute: 2 10*3/uL (ref 0.7–3.1)
Lymphs: 23 %
MCH: 27.5 pg (ref 26.6–33.0)
MCHC: 32.5 g/dL (ref 31.5–35.7)
MCV: 85 fL (ref 79–97)
Monocytes Absolute: 0.6 10*3/uL (ref 0.1–0.9)
Monocytes: 7 %
Neutrophils Absolute: 5.6 10*3/uL (ref 1.4–7.0)
Neutrophils: 67 %
Platelets: 266 10*3/uL (ref 150–450)
RBC: 4.66 x10E6/uL (ref 3.77–5.28)
RDW: 13.3 % (ref 11.7–15.4)
WBC: 8.4 10*3/uL (ref 3.4–10.8)

## 2022-04-29 LAB — VITAMIN D 25 HYDROXY (VIT D DEFICIENCY, FRACTURES): Vit D, 25-Hydroxy: 49.5 ng/mL (ref 30.0–100.0)

## 2022-04-29 LAB — INSULIN, RANDOM: INSULIN: 18.6 u[IU]/mL (ref 2.6–24.9)

## 2022-05-10 NOTE — Progress Notes (Signed)
Chief Complaint:   OBESITY Molly Cherry is here to discuss her progress with her obesity treatment plan along with follow-up of her obesity related diagnoses. Molly Cherry is on the Category 3 Plan and states she is following her eating plan approximately 50% of the time. Molly Cherry states she is not exercising.   Today's visit was #: 27 Starting weight: 277 lbs Starting date: 11/12/2020 Today's weight: 275 lbs Today's date: 04/28/2022 Total lbs lost to date: 2 lbs Total lbs lost since last in-office visit: 0  Interim History: Obtained fasting IC today it was 2102.  On 05/22 it was 2218.  Patient was sick with URI for 3-4 days within the past 1-2 weeks.  She did not each much, but ate whatever tasted good.   Subjective:   1. SOB (shortness of breath) on exertion She has to go up a couple of flights of stairs and gets SOB. She denies chest pain and heart palpitations.    2. Insulin resistance Not hungry a lot of the time.  Other times she snacks on, off plan foods.   3. Vitamin D deficiency She is currently taking prescription vitamin D 50,000 IU each week. She denies nausea, vomiting or muscle weakness.  4. Other hyperlipidemia She is not taking any medications.    5. Other depression with emotional eating Her mood is stable.  She denies emotional eating or concerns.  When she feels emotional she does not eat.    Assessment/Plan:   Orders Placed This Encounter  Procedures   CBC with Differential/Platelet   Comprehensive metabolic panel   Hemoglobin A1c   Insulin, random   Lipid Panel With LDL/HDL Ratio   TSH   VITAMIN D 25 Hydroxy (Vit-D Deficiency, Fractures)    Medications Discontinued During This Encounter  Medication Reason   Vitamin D, Ergocalciferol, (DRISDOL) 1.25 MG (50000 UNIT) CAPS capsule Reorder   buPROPion (WELLBUTRIN SR) 150 MG 12 hr tablet Reorder     Meds ordered this encounter  Medications   buPROPion (WELLBUTRIN SR) 150 MG 12 hr tablet    Sig: 1  po q am and 1 po q late afternoon    Dispense:  60 tablet    Refill:  0    Ov for rf   Vitamin D, Ergocalciferol, (DRISDOL) 1.25 MG (50000 UNIT) CAPS capsule    Sig: 1 po q wed and 1 po q sun    Dispense:  8 capsule    Refill:  0    Please dispense 30 d supply--> 8 tabs each month;  30 d supply;  ** OV for RF **   Do not send RF request     1. SOB (shortness of breath) on exertion Repeat IC showed decreased metabolism by about 110 kcal.  Will decrease meal plan from Category 3 to Category 2 with modifications.   Check labs today.   - CBC with Differential/Platelet - Insulin, random  2. Insulin resistance Gave handout on metformin for patient to read.  Check labs and will consider starting new medication next office visit.   - Hemoglobin A1c - Insulin, random - TSH  3. Vitamin D deficiency Check labs today.    Refill - Vitamin D, Ergocalciferol, (DRISDOL) 1.25 MG (50000 UNIT) CAPS capsule; 1 po q wed and 1 po q sun  Dispense: 8 capsule; Refill: 0  - VITAMIN D 25 Hydroxy (Vit-D Deficiency, Fractures)  4. Other hyperlipidemia Continue prudent nutritional plan and weight loss and increase exercise. Decrease trans fats and  salt intake.   - Comprehensive metabolic panel - Lipid Panel With LDL/HDL Ratio   5. Other depression with emotional eating Refill - buPROPion (WELLBUTRIN SR) 150 MG 12 hr tablet; 1 po q am and 1 po q late afternoon  Dispense: 60 tablet; Refill: 0   6. Obesity with current BMI of 47.3 Goal is to eat breakfast on plan as patient tends to do better when she does eat on plan.    Molly Cherry is currently in the action stage of change. As such, her goal is to continue with weight loss efforts. She has agreed to the Category 2 Plan with breakfast and lunch options.    Exercise goals: All adults should avoid inactivity. Some physical activity is better than none, and adults who participate in any amount of physical activity gain some health benefits.  Start  walking 10-15 minutes daily.   Behavioral modification strategies: increasing lean protein intake, increasing high fiber foods, and no skipping meals.  Molly Cherry has agreed to follow-up with our clinic in 3 weeks. She was informed of the importance of frequent follow-up visits to maximize her success with intensive lifestyle modifications for her multiple health conditions.   Molly Cherry was informed we would discuss her lab results at her next visit unless there is a critical issue that needs to be addressed sooner. Molly Cherry agreed to keep her next visit at the agreed upon time to discuss these results.  Objective:   Blood pressure 132/82, pulse 69, temperature 98.8 F (37.1 C), height 5\' 4"  (1.626 m), weight 275 lb 9.6 oz (125 kg), SpO2 99 %. Body mass index is 47.31 kg/m.  General: Cooperative, alert, well developed, in no acute distress. HEENT: Conjunctivae and lids unremarkable. Cardiovascular: Regular rhythm.  Lungs: Normal work of breathing. Neurologic: No focal deficits.   Lab Results  Component Value Date   CREATININE 0.82 04/28/2022   BUN 15 04/28/2022   NA 141 04/28/2022   K 4.9 04/28/2022   CL 105 04/28/2022   CO2 23 04/28/2022   Lab Results  Component Value Date   ALT 15 04/28/2022   AST 18 04/28/2022   ALKPHOS 106 04/28/2022   BILITOT 0.2 04/28/2022   Lab Results  Component Value Date   HGBA1C 5.5 04/28/2022   HGBA1C 5.2 11/09/2021   HGBA1C 5.6 06/29/2021   HGBA1C 5.4 11/12/2020   Lab Results  Component Value Date   INSULIN 18.6 04/28/2022   INSULIN 14.8 11/09/2021   INSULIN 22.0 06/29/2021   INSULIN 24.2 03/02/2021   INSULIN 18.4 11/12/2020   Lab Results  Component Value Date   TSH 1.940 04/28/2022   Lab Results  Component Value Date   CHOL 175 04/28/2022   HDL 59 04/28/2022   LDLCALC 99 04/28/2022   TRIG 94 04/28/2022   CHOLHDL 3.2 06/29/2021   Lab Results  Component Value Date   VD25OH 49.5 04/28/2022   VD25OH 50.9 11/09/2021   VD25OH  54.1 06/29/2021   Lab Results  Component Value Date   WBC 8.4 04/28/2022   HGB 12.8 04/28/2022   HCT 39.4 04/28/2022   MCV 85 04/28/2022   PLT 266 04/28/2022   No results found for: "IRON", "TIBC", "FERRITIN"   Attestation Statements:   Reviewed by clinician on day of visit: allergies, medications, problem list, medical history, surgical history, family history, social history, and previous encounter notes.  I, 13/01/2022, RMA, am acting as [employer] for [employer] manager, DO.   I have reviewed the above documentation for accuracy and  completeness, and I agree with the above. Marjory Sneddon, D.O.  The [place] was signed into law in 2016 which includes the topic of electronic health records.  This provides immediate access to information in MyChart.  This includes consultation notes, operative notes, office notes, lab results and pathology reports.  If you have any questions about what you read please let us know at your next visit so we can discuss your concerns and take corrective action if need be.  We are right here with you.

## 2022-05-12 ENCOUNTER — Ambulatory Visit: Payer: 59 | Admitting: Nurse Practitioner

## 2022-05-18 ENCOUNTER — Ambulatory Visit: Payer: 59 | Admitting: Nurse Practitioner

## 2022-05-18 ENCOUNTER — Encounter: Payer: Self-pay | Admitting: Nurse Practitioner

## 2022-05-18 VITALS — BP 162/99 | HR 94 | Resp 20 | Ht 63.78 in | Wt 280.0 lb

## 2022-05-18 DIAGNOSIS — Z6841 Body Mass Index (BMI) 40.0 and over, adult: Secondary | ICD-10-CM

## 2022-05-18 DIAGNOSIS — N946 Dysmenorrhea, unspecified: Secondary | ICD-10-CM | POA: Diagnosis not present

## 2022-05-18 DIAGNOSIS — Z7689 Persons encountering health services in other specified circumstances: Secondary | ICD-10-CM

## 2022-05-18 MED ORDER — DROSPIRENONE-ETHINYL ESTRADIOL 3-0.02 MG PO TABS: 1.0000 | ORAL_TABLET | Freq: Every day | ORAL | 11 refills | Status: DC

## 2022-05-18 NOTE — Progress Notes (Signed)
New Patient Office Visit  Subjective    Patient ID: Molly Cherry, female    DOB: 2001-04-12  Age: 21 y.o. MRN: [id number]  CC:  Chief Complaint  Patient presents with   Establish Care    HPI Molly Cherry presents to establish care The patient is transferring care from [facility] physicians.  She would like to start on oral contraceptives.  -plans to use for both birth control and to help control heavy and irregular menstrual periods.  -she is sexually active with one intimate partner.   -[facility]  She is currently on bupropion twice daily and doing well.     Outpatient Encounter Medications as of 05/18/2022  Medication Sig   buPROPion (WELLBUTRIN SR) 150 MG 12 hr tablet 1 po q am and 1 po q late afternoon   drospirenone-ethinyl estradiol (YAZ) 3-0.02 MG tablet Take 1 tablet by mouth daily.   levocetirizine (XYZAL) 5 MG tablet Take 1 tablet (5 mg total) by mouth every evening.   montelukast (SINGULAIR) 10 MG tablet Take 1 tablet (10 mg total) by mouth at bedtime.   vitamin B-12 (CYANOCOBALAMIN) 500 MCG tablet Take 1 tablet (500 mcg total) by mouth daily.   Vitamin D, Ergocalciferol, (DRISDOL) 1.25 MG (50000 UNIT) CAPS capsule 1 po q wed and 1 po q sun   No facility-administered encounter medications on file as of 05/18/2022.    Past Medical History:  Diagnosis Date   Known health problems: none     Past Surgical History:  Procedure Laterality Date   APPENDECTOMY      Family History  Problem Relation Age of Onset   Asthma Mother    Depression Mother    Anxiety disorder Mother    Obesity Mother    Hyperlipidemia Maternal Grandmother    Glaucoma Maternal Grandmother    Hypertension Maternal Grandfather    Epilepsy Maternal Grandfather     Social History   Socioeconomic History   Marital status: Single    Spouse name: Not on file   Number of children: Not on file   Years of education: Not on file   Highest  education level: Not on file  Occupational History   Occupation: Full time Student  Tobacco Use   Smoking status: Never   Smokeless tobacco: Never  Substance and Sexual Activity   Alcohol use: No   Drug use: No   Sexual activity: Never    Partners: Male    Birth control/protection: None  Other Topics Concern   Not on file  Social History Narrative   Not on file   Social Determinants of Health   Financial Resource Strain: Not on file  Food Insecurity: Not on file  Transportation Needs: Not on file  Physical Activity: Not on file  Stress: Not on file  Social Connections: Not on file  Intimate Partner Violence: Not on file    Review of Systems  Constitutional:  Negative for chills, fever and malaise/fatigue.  HENT:  Negative for congestion, sinus pain and sore throat.   Eyes: Negative.   Respiratory:  Negative for cough, shortness of breath and wheezing.   Cardiovascular:  Negative for chest pain, palpitations and leg swelling.  Gastrointestinal:  Negative for constipation, diarrhea, nausea and vomiting.  Genitourinary:        Heavy and irregular menstrual periods at times   Musculoskeletal:  Negative for myalgias.  Skin: Negative.   Neurological:  Negative for dizziness and headaches.  Endo/Heme/Allergies:  Does not bruise/bleed easily.  Psychiatric/Behavioral:  Negative for depression. The patient is not nervous/anxious.         Objective    Today's Vitals   05/18/22 1312 05/18/22 1347  BP: (Abnormal) 132/90 (Abnormal) 162/99  Pulse: (Abnormal) 105 94  Resp: 20   SpO2: 99%   Weight: 280 lb (127 kg)   Height: 5' 3.78" (1.62 m)   PainSc: 0-No pain    Body mass index is 48.39 kg/m.    Physical Exam Vitals and nursing note reviewed.  Constitutional:      Appearance: Normal appearance. She is well-developed. She is obese.  HENT:     Head: Normocephalic and atraumatic.     Nose: Nose normal.     Mouth/Throat:     Mouth: Mucous membranes are moist.      Pharynx: Oropharynx is clear.  Eyes:     Extraocular Movements: Extraocular movements intact.     Conjunctiva/sclera: Conjunctivae normal.     Pupils: Pupils are equal, round, and reactive to light.  Cardiovascular:     Rate and Rhythm: Normal rate and regular rhythm.     Pulses: Normal pulses.     Heart sounds: Normal heart sounds.  Pulmonary:     Effort: Pulmonary effort is normal.     Breath sounds: Normal breath sounds.  Abdominal:     Palpations: Abdomen is soft.  Musculoskeletal:        General: Normal range of motion.     Cervical back: Normal range of motion and neck supple.  Lymphadenopathy:     Cervical: No cervical adenopathy.  Skin:    General: Skin is warm and dry.     Capillary Refill: Capillary refill takes less than 2 seconds.  Neurological:     General: No focal deficit present.     Mental Status: She is alert and oriented to person, place, and time.  Psychiatric:        Mood and Affect: Mood normal.        Behavior: Behavior normal.        Thought Content: Thought content normal.        Judgment: Judgment normal.         Assessment & Plan:  1. Dysmenorrhea Start Yaz daily. Obtain CPE with pap and routine STD testing in 4 months  - drospirenone-ethinyl estradiol (YAZ) 3-0.02 MG tablet; Take 1 tablet by mouth daily.  Dispense: 28 tablet; Refill: 11  2. BMI 45.0-49.9, adult ([name]) Discussed lowering calorie intake to 1500 calories per day and incorporating exercise into daily routine to help lose weight. She plans [facility].    3. Encounter to establish care Appointment today to establish new primary care provider     Problem List Items Addressed This Visit       Genitourinary   Dysmenorrhea - Primary   Relevant Medications   drospirenone-ethinyl estradiol (YAZ) 3-0.02 MG tablet     Other   BMI 45.0-49.9, adult ([name])   Other Visit Diagnoses     Encounter to establish care            Return in about 4 months (around 09/16/2022) for health maintenance exam, with pap - will need urine for GC/Chlamydia.   Molly Jews, NP

## 2022-05-25 ENCOUNTER — Other Ambulatory Visit (INDEPENDENT_AMBULATORY_CARE_PROVIDER_SITE_OTHER): Payer: Self-pay | Admitting: Family Medicine

## 2022-05-25 DIAGNOSIS — E559 Vitamin D deficiency, unspecified: Secondary | ICD-10-CM

## 2022-05-27 ENCOUNTER — Other Ambulatory Visit (INDEPENDENT_AMBULATORY_CARE_PROVIDER_SITE_OTHER): Payer: Self-pay | Admitting: Family Medicine

## 2022-05-27 DIAGNOSIS — J302 Other seasonal allergic rhinitis: Secondary | ICD-10-CM

## 2022-05-30 DIAGNOSIS — Z6841 Body Mass Index (BMI) 40.0 and over, adult: Secondary | ICD-10-CM | POA: Insufficient documentation

## 2022-05-30 DIAGNOSIS — N946 Dysmenorrhea, unspecified: Secondary | ICD-10-CM | POA: Insufficient documentation

## 2022-05-31 ENCOUNTER — Ambulatory Visit (INDEPENDENT_AMBULATORY_CARE_PROVIDER_SITE_OTHER): Payer: 59 | Admitting: Family Medicine

## 2022-06-24 ENCOUNTER — Encounter (INDEPENDENT_AMBULATORY_CARE_PROVIDER_SITE_OTHER): Payer: Self-pay | Admitting: Family Medicine

## 2022-06-24 ENCOUNTER — Ambulatory Visit (INDEPENDENT_AMBULATORY_CARE_PROVIDER_SITE_OTHER): Payer: 59 | Admitting: Family Medicine

## 2022-06-24 VITALS — BP 133/91 | HR 83 | Temp 98.6°F | Ht 64.0 in | Wt 278.4 lb

## 2022-06-24 DIAGNOSIS — N946 Dysmenorrhea, unspecified: Secondary | ICD-10-CM

## 2022-06-24 DIAGNOSIS — Z6841 Body Mass Index (BMI) 40.0 and over, adult: Secondary | ICD-10-CM

## 2022-06-24 DIAGNOSIS — E559 Vitamin D deficiency, unspecified: Secondary | ICD-10-CM | POA: Diagnosis not present

## 2022-06-24 DIAGNOSIS — E88819 Insulin resistance, unspecified: Secondary | ICD-10-CM | POA: Diagnosis not present

## 2022-06-24 DIAGNOSIS — J302 Other seasonal allergic rhinitis: Secondary | ICD-10-CM

## 2022-06-24 DIAGNOSIS — F5089 Other specified eating disorder: Secondary | ICD-10-CM

## 2022-06-24 DIAGNOSIS — E669 Obesity, unspecified: Secondary | ICD-10-CM

## 2022-06-24 MED ORDER — MONTELUKAST SODIUM 10 MG PO TABS: 10.0000 mg | ORAL_TABLET | Freq: Every day | ORAL | 0 refills | Status: DC

## 2022-06-24 MED ORDER — METFORMIN HCL 500 MG PO TABS: ORAL_TABLET | ORAL | 0 refills | Status: DC

## 2022-06-24 MED ORDER — BUPROPION HCL ER (SR) 150 MG PO TB12: ORAL_TABLET | ORAL | 0 refills | Status: DC

## 2022-06-24 MED ORDER — LEVOCETIRIZINE DIHYDROCHLORIDE 5 MG PO TABS: 5.0000 mg | ORAL_TABLET | Freq: Every evening | ORAL | 0 refills | Status: DC

## 2022-06-24 MED ORDER — VITAMIN D (ERGOCALCIFEROL) 1.25 MG (50000 UNIT) PO CAPS: ORAL_CAPSULE | ORAL | 0 refills | Status: DC

## 2022-07-06 ENCOUNTER — Encounter: Payer: Self-pay | Admitting: Nurse Practitioner

## 2022-07-06 ENCOUNTER — Ambulatory Visit (INDEPENDENT_AMBULATORY_CARE_PROVIDER_SITE_OTHER): Payer: 59 | Admitting: Nurse Practitioner

## 2022-07-06 VITALS — BP 118/86 | HR 90 | Ht 63.78 in | Wt 277.0 lb

## 2022-07-06 DIAGNOSIS — J039 Acute tonsillitis, unspecified: Secondary | ICD-10-CM | POA: Diagnosis not present

## 2022-07-06 DIAGNOSIS — J029 Acute pharyngitis, unspecified: Secondary | ICD-10-CM

## 2022-07-06 LAB — POCT INFLUENZA A/B
Influenza A, POC: NEGATIVE
Influenza B, POC: NEGATIVE

## 2022-07-06 LAB — POCT RAPID STREP A (OFFICE): Rapid Strep A Screen: NEGATIVE

## 2022-07-06 MED ORDER — AMOXICILLIN 875 MG PO TABS: 875.0000 mg | ORAL_TABLET | Freq: Two times a day (BID) | ORAL | 0 refills | Status: DC

## 2022-07-06 NOTE — Progress Notes (Signed)
Established patient visit   Patient: Molly Cherry   DOB: 06/01/2001   22 y.o. Female  MRN: [id number] Visit Date: 07/06/2022  Chief Complaint  Patient presents with   Sore Throat   Subjective    Sore Throat  This is a new problem. The current episode started in the past 7 days. The problem has been unchanged. There has been no fever. Associated symptoms include congestion, headaches and swollen glands. Pertinent negatives include no abdominal pain, coughing, diarrhea, drooling, ear discharge, shortness of breath, stridor, trouble swallowing or vomiting. She has had no exposure to strep or mono. She has tried acetaminophen and gargles for the symptoms. The treatment provided moderate relief.     Medications: Outpatient Medications Prior to Visit  Medication Sig   buPROPion (WELLBUTRIN SR) 150 MG 12 hr tablet 1 po q am and 1 po q late afternoon   drospirenone-ethinyl estradiol (YAZ) 3-0.02 MG tablet Take 1 tablet by mouth daily.   vitamin B-12 (CYANOCOBALAMIN) 500 MCG tablet Take 1 tablet (500 mcg total) by mouth daily.   [DISCONTINUED] levocetirizine (XYZAL) 5 MG tablet Take 1 tablet (5 mg total) by mouth every evening.   [DISCONTINUED] metFORMIN (GLUCOPHAGE) 500 MG tablet 1 po with lunch daily   [DISCONTINUED] montelukast (SINGULAIR) 10 MG tablet Take 1 tablet (10 mg total) by mouth at bedtime.   [DISCONTINUED] Vitamin D, Ergocalciferol, (DRISDOL) 1.25 MG (50000 UNIT) CAPS capsule 1 po q wed and 1 po q sun   No facility-administered medications prior to visit.    Review of Systems  HENT:  Positive for congestion. Negative for drooling, ear discharge and trouble swallowing.   Respiratory:  Negative for cough, shortness of breath and stridor.   Gastrointestinal:  Negative for abdominal pain, diarrhea and vomiting.  Neurological:  Positive for headaches.     Objective     Today's Vitals   07/06/22 1509  BP: 118/86  Pulse: 90  SpO2: 98%  Weight: 277 lb (125.6 kg)   Body  mass index is 47.55 kg/m.   Physical Exam Vitals and nursing note reviewed.  Constitutional:      Appearance: Normal appearance. She is well-developed.  HENT:     Head: Normocephalic and atraumatic.     Right Ear: Tympanic membrane is erythematous. Tympanic membrane is not bulging.     Left Ear: Tympanic membrane is erythematous. Tympanic membrane is not bulging.     Nose: Congestion present.     Right Turbinates: Swollen.     Left Turbinates: Swollen.     Right Sinus: No maxillary sinus tenderness or frontal sinus tenderness.     Left Sinus: No maxillary sinus tenderness or frontal sinus tenderness.     Mouth/Throat:     Lips: Pink.     Mouth: Mucous membranes are moist.     Pharynx: Posterior oropharyngeal erythema present.     Tonsils: 2+ on the right. 2+ on the left.  Eyes:     Pupils: Pupils are equal, round, and reactive to light.  Cardiovascular:     Rate and Rhythm: Normal rate and regular rhythm.     Pulses: Normal pulses.     Heart sounds: Normal heart sounds.  Pulmonary:     Effort: Pulmonary effort is normal.     Breath sounds: Normal breath sounds.  Abdominal:     Palpations: Abdomen is soft.  Musculoskeletal:        General: Normal range of motion.     Cervical back: Normal range of motion  and neck supple.  Lymphadenopathy:     Cervical: Cervical adenopathy present.  Skin:    General: Skin is warm and dry.     Capillary Refill: Capillary refill takes less than 2 seconds.  Neurological:     General: No focal deficit present.     Mental Status: She is alert and oriented to person, place, and time.  Psychiatric:        Mood and Affect: Mood normal.        Behavior: Behavior normal.        Thought Content: Thought content normal.        Judgment: Judgment normal.    Results for orders placed or performed in visit on 07/06/22  POCT Influenza A/B  Result Value Ref Range   Influenza A, POC Negative Negative   Influenza B, POC Negative Negative  POCT rapid  strep A  Result Value Ref Range   Rapid Strep A Screen Negative Negative    Assessment & Plan     1. Acute tonsillitis, unspecified etiology Treat with amoxicillin 875 mg twice daily for 7 days. Rest and increase fluids. Continue using OTC medication to control symptoms.    2. Acute sore throat Negative strep and flu tests today. Treat with antibiotics based on symptoms and clinical exam.  - POCT Influenza A/B - POCT rapid strep A   Return for prn worsening or persistent symptoms.        Ronnell Freshwater, NP  [facility] [phone] (phone) [phone] (fax)  [place]

## 2022-07-10 NOTE — Progress Notes (Unsigned)
Chief Complaint:   OBESITY Molly Cherry is here to discuss her progress with her obesity treatment plan along with follow-up of her obesity related diagnoses. Molly Cherry is on the Category 2 Plan with breakfast and lunch options and states she is following her eating plan approximately 50% of the time. [place] states she is gym 30-45 minutes 4-5 times per week.  Today's visit was #: 28 Starting weight: 277 LBS Starting date: 11/12/2020 Today's weight: 278 LBS Today's date: 06/24/2022 Total lbs lost to date: 0 Total lbs lost since last in-office visit: +3 LBS  Interim History: Office visit with was 04/28/2022.  Patient fell off things over the holidays and ready to refocus her efforts.  We changed to the category 2 with breakfast and lunch options last office visit, but she has not really followed it.  She purchased a Photographer for Christmas as a gift to herself, [employer].  Subjective:   1. Vitamin D deficiency Discussed labs with patient today. Last office visit labs were drawn patient here to review today.  Vitamin D is at goal at 49.5.  Energy levels are good.  2. Dysmenorrhea Discussed labs with patient today. Patient's menses have not been as heavy or irregular bleeding.  CBC, CMP and TSH are within normal limits.    3. Seasonal allergies Symptoms are well-controlled on medications.  She is tolerating well.  Patient has good compliance, and desires to continue medications.  4. Insulin resistance Worsening.  Discussed labs with patient today. A1c worse from 5.2 to 5.5, but still within normal limits.  Fasting insulin was from 14 to 18.  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 Patient strep is well-controlled.  Mood, no depression or concerns.  Emotional eating has not been bad per patient sleeps well.  TSH within normal limits.  Assessment/Plan:  No orders of the defined types were placed in this encounter.   Medications  Discontinued During This Encounter  Medication Reason   montelukast (SINGULAIR) 10 MG tablet Reorder   levocetirizine (XYZAL) 5 MG tablet Reorder   buPROPion (WELLBUTRIN SR) 150 MG 12 hr tablet Reorder   Vitamin D, Ergocalciferol, (DRISDOL) 1.25 MG (50000 UNIT) CAPS capsule Reorder     Meds ordered this encounter  Medications   buPROPion (WELLBUTRIN SR) 150 MG 12 hr tablet    Sig: 1 po q am and 1 po q late afternoon    Dispense:  60 tablet    Refill:  0    Ov for rf   Vitamin D, Ergocalciferol, (DRISDOL) 1.25 MG (50000 UNIT) CAPS capsule    Sig: 1 po q wed and 1 po q sun    Dispense:  8 capsule    Refill:  0    Please dispense 30 d supply--> 8 tabs each month;  30 d supply;  ** OV for RF **   Do not send RF request   levocetirizine (XYZAL) 5 MG tablet    Sig: Take 1 tablet (5 mg total) by mouth every evening.    Dispense:  30 tablet    Refill:  0    30 d supply;  ** OV for RF **   Do not send RF request   montelukast (SINGULAIR) 10 MG tablet    Sig: Take 1 tablet (10 mg total) by mouth at bedtime.    Dispense:  30 tablet    Refill:  0    30 d supply;  ** OV for RF **  Do not send RF request   metFORMIN (GLUCOPHAGE) 500 MG tablet    Sig: 1 po with lunch daily    Dispense:  30 tablet    Refill:  0    30 d supply;  ** OV for RF **   Do not send RF request     1. Vitamin D deficiency - I again reiterated the importance of vitamin D (as well as calcium) to their health and wellbeing.  - I reviewed possible symptoms of low Vitamin D:  low energy, depressed mood, muscle aches, joint aches, osteoporosis etc. - low Vitamin D levels may be linked to an increased risk of cardiovascular events and even increased risk of cancers- such as colon and breast.  - ideal vitamin D levels reviewed with patient  - I recommend pt take a 50,000 IU weekly prescription vit D - see script below   - Informed patient this may be a lifelong thing, and she was encouraged to continue to take the medicine  until told otherwise.    - weight loss will likely improve availability of vitamin D, thus encouraged Dana to continue with meal plan and their weight loss efforts to further improve this condition.  Thus, we will need to monitor levels regularly (every 3-4 mo on average) to keep levels within normal limits and prevent over supplementation. - pt's questions and concerns regarding this condition addressed.  Continue at same dose his vitamin D is at goal.  Refill- Vitamin D, Ergocalciferol, (DRISDOL) 1.25 MG (50000 UNIT) CAPS capsule; 1 po q wed and 1 po q sun  Dispense: 8 capsule; Refill: 0  2. Dysmenorrhea Continue follow-up with PCP or GYN.  Patient now on BCP's which is likely the cause of improvement with her cycles.  Labs are stable.  3. Seasonal allergies Can do trial of decreasing dose of the medication to see how it goes.  Refill- levocetirizine (XYZAL) 5 MG tablet; Take 1 tablet (5 mg total) by mouth every evening.  Dispense: 30 tablet; Refill: 0  Refill- montelukast (SINGULAIR) 10 MG tablet; Take 1 tablet (10 mg total) by mouth at bedtime.  Dispense: 30 tablet; Refill: 0  4. Insulin resistance Counseling done.  Due to condition, patient desires metformin and handouts given last time.  Discussed risk and benefits of medications with patient.  Patient is to start half tablet with lunch and increase as tolerated.  Continue PNP, exercise, and weight loss.  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  Start tracking, and following meal plan and do not skip proteins, or meals, which will help decrease emotional eating episodes.  Get 7 to 9 hours of sleep per night.  No dose changes needed at this time for Wellbutrin.  Refill- buPROPion (WELLBUTRIN SR) 150 MG 12 hr tablet; 1 po q am and 1 po q late afternoon  Dispense: 60 tablet; Refill: 0  6. Obesity with current  BMI of 47.8 Patient's normal is to go 4 to 5 days/week to the gym.  We reviewed specific exercises and schedule for the patient to follow.  Emanii is currently in the action stage of change. As such, her goal is to continue with weight loss efforts. She has agreed to the Category 2 Plan with breakfast and lunch options..   Exercise goals:  As is.  Behavioral modification strategies: increasing lean protein intake, no skipping meals, avoiding temptations,  and planning for success.  Ivett has agreed to follow-up with our clinic in 3 weeks. She was informed of the importance of frequent follow-up visits to maximize her success with intensive lifestyle modifications for her multiple health conditions.   Objective:   Blood pressure (!) 133/91, pulse 83, temperature 98.6 F (37 C), height 5\' 4"  (1.626 m), weight 278 lb 6.4 oz (126.3 kg), SpO2 99 %. Body mass index is 47.79 kg/m.  General: Cooperative, alert, well developed, in no acute distress. HEENT: Conjunctivae and lids unremarkable. Cardiovascular: Regular rhythm.  Lungs: Normal work of breathing. Neurologic: No focal deficits.   Lab Results  Component Value Date   CREATININE 0.82 04/28/2022   BUN 15 04/28/2022   NA 141 04/28/2022   K 4.9 04/28/2022   CL 105 04/28/2022   CO2 23 04/28/2022   Lab Results  Component Value Date   ALT 15 04/28/2022   AST 18 04/28/2022   ALKPHOS 106 04/28/2022   BILITOT 0.2 04/28/2022   Lab Results  Component Value Date   HGBA1C 5.5 04/28/2022   HGBA1C 5.2 11/09/2021   HGBA1C 5.6 06/29/2021   HGBA1C 5.4 11/12/2020   Lab Results  Component Value Date   INSULIN 18.6 04/28/2022   INSULIN 14.8 11/09/2021   INSULIN 22.0 06/29/2021   INSULIN 24.2 03/02/2021   INSULIN 18.4 11/12/2020   Lab Results  Component Value Date   TSH 1.940 04/28/2022   Lab Results  Component Value Date   CHOL 175 04/28/2022   HDL 59 04/28/2022   LDLCALC 99 04/28/2022   TRIG 94 04/28/2022   CHOLHDL 3.2  06/29/2021   Lab Results  Component Value Date   VD25OH 49.5 04/28/2022   VD25OH 50.9 11/09/2021   VD25OH 54.1 06/29/2021   Lab Results  Component Value Date   WBC 8.4 04/28/2022   HGB 12.8 04/28/2022   HCT 39.4 04/28/2022   MCV 85 04/28/2022   PLT 266 04/28/2022   No results found for: "IRON", "TIBC", "FERRITIN"  Attestation Statements:   Reviewed by clinician on day of visit: allergies, medications, problem list, medical history, surgical history, family history, social history, and previous encounter notes.  I, Davy Pique, RMA, am acting as Location manager for [employer], DO.   I have reviewed the above documentation for accuracy and completeness, and I agree with the above. Marjory Sneddon, D.O.  The [place] was signed into law in 2016 which includes the topic of electronic health records.  This provides immediate access to information in MyChart.  This includes consultation notes, operative notes, office notes, lab results and pathology reports.  If you have any questions about what you read please let us know at your next visit so we can discuss your concerns and take corrective action if need be.  We are right here with you.

## 2022-07-15 ENCOUNTER — Ambulatory Visit (INDEPENDENT_AMBULATORY_CARE_PROVIDER_SITE_OTHER): Payer: 59 | Admitting: Family Medicine

## 2022-07-15 ENCOUNTER — Encounter (INDEPENDENT_AMBULATORY_CARE_PROVIDER_SITE_OTHER): Payer: Self-pay | Admitting: Family Medicine

## 2022-07-15 VITALS — BP 131/88 | HR 95 | Temp 98.9°F | Ht 64.0 in | Wt 278.6 lb

## 2022-07-15 DIAGNOSIS — E669 Obesity, unspecified: Secondary | ICD-10-CM | POA: Diagnosis not present

## 2022-07-15 DIAGNOSIS — J3089 Other allergic rhinitis: Secondary | ICD-10-CM | POA: Diagnosis not present

## 2022-07-15 DIAGNOSIS — E559 Vitamin D deficiency, unspecified: Secondary | ICD-10-CM | POA: Diagnosis not present

## 2022-07-15 DIAGNOSIS — E88819 Insulin resistance, unspecified: Secondary | ICD-10-CM

## 2022-07-15 DIAGNOSIS — Z6841 Body Mass Index (BMI) 40.0 and over, adult: Secondary | ICD-10-CM

## 2022-07-15 MED ORDER — VITAMIN D (ERGOCALCIFEROL) 1.25 MG (50000 UNIT) PO CAPS: ORAL_CAPSULE | ORAL | 0 refills | Status: DC

## 2022-07-15 MED ORDER — MONTELUKAST SODIUM 10 MG PO TABS: 10.0000 mg | ORAL_TABLET | Freq: Every day | ORAL | 0 refills | Status: DC

## 2022-07-15 MED ORDER — METFORMIN HCL 500 MG PO TABS: ORAL_TABLET | ORAL | 0 refills | Status: DC

## 2022-07-16 ENCOUNTER — Other Ambulatory Visit (INDEPENDENT_AMBULATORY_CARE_PROVIDER_SITE_OTHER): Payer: Self-pay | Admitting: Family Medicine

## 2022-07-16 DIAGNOSIS — F5089 Other specified eating disorder: Secondary | ICD-10-CM

## 2022-08-03 NOTE — Progress Notes (Signed)
Chief Complaint:   OBESITY Molly Cherry is here to discuss her progress with her obesity treatment plan along with follow-up of her obesity related diagnoses. Molly Cherry is on the Category 2 Plan with breakfast and lunch options and states she is following her eating plan approximately 25% of the time. Molly Cherry states she is walking on the treadmill 30 minutes 2 times per week.  Today's visit was #: 80 Starting weight: 277 LBS Starting date: 11/12/2020 Today's weight: 278 LBS Today's date: 07/15/2022 Total lbs lost to date: 0 Total lbs lost since last in-office visit: 0  Interim History: Patient has strep throat and then other illnesses in the past couple of weeks.  She recently had a birthday.  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  Subjective:   1. Insulin resistance Patient started metformin last office visit and is working well with decreased cravings and hunger.  He still can eat all of her foods.  She is tolerating well no side effects.  2. Vitamin D deficiency Molly Cherry is tolerating medication(s) well without side effects.  Medication compliance is good as patient endorses taking it as prescribed.  Symptoms are stable and the patient denies additional concerns regarding this condition.    3. Environmental and seasonal allergies Symptoms are under great control, not needing Xyzal.  Assessment/Plan:  No orders of the defined types were placed in this encounter.   Medications Discontinued During This Encounter  Medication Reason   amoxicillin (AMOXIL) 875 MG tablet    Vitamin D, Ergocalciferol, (DRISDOL) 1.25 MG (50000 UNIT) CAPS capsule Reorder   montelukast (SINGULAIR) 10 MG tablet Reorder   metFORMIN (GLUCOPHAGE) 500 MG tablet Reorder     Meds ordered this encounter  Medications   Vitamin D, Ergocalciferol, (DRISDOL) 1.25 MG (50000 UNIT) CAPS capsule    Sig: 1 po q wed and 1 po q sun    Dispense:  8 capsule    Refill:  0    Please dispense 30 d  supply--> 8 tabs each month;  30 d supply;  ** OV for RF **   Do not send RF request   montelukast (SINGULAIR) 10 MG tablet    Sig: Take 1 tablet (10 mg total) by mouth at bedtime.    Dispense:  30 tablet    Refill:  0    30 d supply;  ** OV for RF **   Do not send RF request   metFORMIN (GLUCOPHAGE) 500 MG tablet    Sig: 1 po with lunch daily    Dispense:  30 tablet    Refill:  0    30 d supply;  ** OV for RF **   Do not send RF request     1. Insulin resistance Disease counseling done.  Refill- metFORMIN (GLUCOPHAGE) 500 MG tablet; 1 po with lunch daily  Dispense: 30 tablet; Refill: 0  2. Vitamin D deficiency Low Vitamin D level contributes to fatigue and are associated with obesity, breast, and colon cancer. She agrees to continue to take prescription Vitamin D @50$ ,000 IU every week and will follow-up for routine testing of Vitamin D, at least 2-3 times per year to avoid over-replacement.  Refill- Vitamin D, Ergocalciferol, (DRISDOL) 1.25 MG (50000 UNIT) CAPS capsule; 1 po q wed and 1 po q sun  Dispense: 8 capsule; Refill: 0  3. Environmental and seasonal allergies Refill- montelukast (SINGULAIR) 10 MG tablet; Take 1 tablet (10 mg total) by mouth at bedtime.  Dispense: 30 tablet;  Refill: 0  4. Obesity with current BMI of 47.8 Plan is to start a gym 3 days/week and get back on plan.  Molly Cherry is currently in the action stage of change. As such, her goal is to continue with weight loss efforts. She has agreed to the Category 2 Plan with breakfast and lunch options..   Exercise goals: For substantial health benefits, adults should do at least 150 minutes (2 hours and 30 minutes) a week of moderate-intensity, or 75 minutes (1 hour and 15 minutes) a week of vigorous-intensity aerobic physical activity, or an equivalent combination of moderate- and vigorous-intensity aerobic activity. Aerobic activity should be performed in episodes of at least 10 minutes, and preferably, it should be  spread throughout the week.  Behavioral modification strategies: meal planning and cooking strategies and planning for success.  Molly Cherry has agreed to follow-up with our clinic in 3 weeks. She was informed of the importance of frequent follow-up visits to maximize her success with intensive lifestyle modifications for her multiple health conditions.   Objective:   Blood pressure 131/88, pulse 95, temperature 98.9 F (37.2 C), height 5' 4"$  (1.626 m), weight 278 lb 9.6 oz (126.4 kg), last menstrual period 06/10/2022, SpO2 97 %. Body mass index is 47.82 kg/m.  General: Cooperative, alert, well developed, in no acute distress. HEENT: Conjunctivae and lids unremarkable. Cardiovascular: Regular rhythm.  Lungs: Normal work of breathing. Neurologic: No focal deficits.   Lab Results  Component Value Date   CREATININE 0.82 04/28/2022   BUN 15 04/28/2022   NA 141 04/28/2022   K 4.9 04/28/2022   CL 105 04/28/2022   CO2 23 04/28/2022   Lab Results  Component Value Date   ALT 15 04/28/2022   AST 18 04/28/2022   ALKPHOS 106 04/28/2022   BILITOT 0.2 04/28/2022   Lab Results  Component Value Date   HGBA1C 5.5 04/28/2022   HGBA1C 5.2 11/09/2021   HGBA1C 5.6 06/29/2021   HGBA1C 5.4 11/12/2020   Lab Results  Component Value Date   INSULIN 18.6 04/28/2022   INSULIN 14.8 11/09/2021   INSULIN 22.0 06/29/2021   INSULIN 24.2 03/02/2021   INSULIN 18.4 11/12/2020   Lab Results  Component Value Date   TSH 1.940 04/28/2022   Lab Results  Component Value Date   CHOL 175 04/28/2022   HDL 59 04/28/2022   LDLCALC 99 04/28/2022   TRIG 94 04/28/2022   CHOLHDL 3.2 06/29/2021   Lab Results  Component Value Date   VD25OH 49.5 04/28/2022   VD25OH 50.9 11/09/2021   VD25OH 54.1 06/29/2021   Lab Results  Component Value Date   WBC 8.4 04/28/2022   HGB 12.8 04/28/2022   HCT 39.4 04/28/2022   MCV 85 04/28/2022   PLT 266 04/28/2022   No results found for: "IRON", "TIBC",  "FERRITIN"  Attestation Statements:   Reviewed by clinician on day of visit: allergies, medications, problem list, medical history, surgical history, family history, social history, and previous encounter notes.  I, Davy Pique, RMA, am acting as Location manager for [employer], DO.   I have reviewed the above documentation for accuracy and completeness, and I agree with the above. Marjory Sneddon, D.O.  The [place] was signed into law in 2016 which includes the topic of electronic health records.  This provides immediate access to information in MyChart.  This includes consultation notes, operative notes, office notes, lab results and pathology reports.  If you have any questions about what you read please  let us know at your next visit so we can discuss your concerns and take corrective action if need be.  We are right here with you.

## 2022-08-04 ENCOUNTER — Other Ambulatory Visit (INDEPENDENT_AMBULATORY_CARE_PROVIDER_SITE_OTHER): Payer: Self-pay | Admitting: Family Medicine

## 2022-08-04 DIAGNOSIS — E88819 Insulin resistance, unspecified: Secondary | ICD-10-CM

## 2022-08-05 ENCOUNTER — Encounter (INDEPENDENT_AMBULATORY_CARE_PROVIDER_SITE_OTHER): Payer: Self-pay | Admitting: Family Medicine

## 2022-08-05 ENCOUNTER — Ambulatory Visit (INDEPENDENT_AMBULATORY_CARE_PROVIDER_SITE_OTHER): Payer: 59 | Admitting: Family Medicine

## 2022-08-05 VITALS — BP 138/90 | HR 101 | Temp 99.4°F | Ht 64.0 in | Wt 278.2 lb

## 2022-08-05 DIAGNOSIS — J302 Other seasonal allergic rhinitis: Secondary | ICD-10-CM

## 2022-08-05 DIAGNOSIS — Z6841 Body Mass Index (BMI) 40.0 and over, adult: Secondary | ICD-10-CM

## 2022-08-05 MED ORDER — LEVOCETIRIZINE DIHYDROCHLORIDE 5 MG PO TABS: 5.0000 mg | ORAL_TABLET | Freq: Every evening | ORAL | 0 refills | Status: DC

## 2022-08-08 DIAGNOSIS — J039 Acute tonsillitis, unspecified: Secondary | ICD-10-CM | POA: Insufficient documentation

## 2022-08-08 DIAGNOSIS — J029 Acute pharyngitis, unspecified: Secondary | ICD-10-CM | POA: Insufficient documentation

## 2022-08-18 NOTE — Progress Notes (Signed)
Chief Complaint:   OBESITY Molly Cherry is here to discuss her progress with her obesity treatment plan along with follow-up of her obesity related diagnoses. Molly Cherry is on the Category 2 Plan with breakfast and lunch options and states she is following her eating plan approximately 70% of the time. Molly Cherry states she is gyms 45 minutes 3-4 times per week.  Today's visit was #: 56 Starting weight: 277 LBS Starting date: 11/12/2020 Today's weight: 278 LBS Today's date: 08/05/2022 Total lbs lost to date: 0 Total lbs lost since last in-office visit: 0  Interim History: Patient struggled with dinner and staying on plan in the evenings.  She endorses that increasing exercise improves mood and she tends to eat better.  It also has been financially more difficult to eat healthy.  Subjective:   1. Seasonal allergies Lemon probably get the air and it gets warmer patient states her allergies flare.  Slight cough, runny nose and head congestion.  Assessment/Plan:  No orders of the defined types were placed in this encounter.   Medications Discontinued During This Encounter  Medication Reason   levocetirizine (XYZAL) 5 MG tablet Reorder     Meds ordered this encounter  Medications   levocetirizine (XYZAL) 5 MG tablet    Sig: Take 1 tablet (5 mg total) by mouth every evening.    Dispense:  30 tablet    Refill:  0    30 d supply;  ** OV for RF **   Do not send RF request     1. Seasonal allergies Continue preventative strategies.  Refill- levocetirizine (XYZAL) 5 MG tablet; Take 1 tablet (5 mg total) by mouth every evening.  Dispense: 30 tablet; Refill: 0  2. BMI 45.0-49.9, adult ([name])-CURRENT BMI 47.8  3. Morbid obesity ([name])-START BMI 47.55 Molly Cherry is currently in the action stage of change. As such, her goal is to continue with weight loss efforts. She has agreed to the Category 2 Plan with breakfast and lunch options.  Exercise goals:  As is.  Behavioral modification  strategies: increasing lean protein intake and decreasing simple carbohydrates.  Molly Cherry has agreed to follow-up with our clinic in 3 weeks. She was informed of the importance of frequent follow-up visits to maximize her success with intensive lifestyle modifications for her multiple health conditions.   Objective:   Blood pressure (!) 138/90, pulse (!) 101, temperature 99.4 F (37.4 C), height '5\' 4"'$  (1.626 m), weight 278 lb 3.2 oz (126.2 kg), last menstrual period 06/10/2022, SpO2 97 %. Body mass index is 47.75 kg/m.  General: Cooperative, alert, well developed, in no acute distress. HEENT: Conjunctivae and lids unremarkable. Cardiovascular: Regular rhythm.  Lungs: Normal work of breathing. Neurologic: No focal deficits.   Lab Results  Component Value Date   CREATININE 0.82 04/28/2022   BUN 15 04/28/2022   NA 141 04/28/2022   K 4.9 04/28/2022   CL 105 04/28/2022   CO2 23 04/28/2022   Lab Results  Component Value Date   ALT 15 04/28/2022   AST 18 04/28/2022   ALKPHOS 106 04/28/2022   BILITOT 0.2 04/28/2022   Lab Results  Component Value Date   HGBA1C 5.5 04/28/2022   HGBA1C 5.2 11/09/2021   HGBA1C 5.6 06/29/2021   HGBA1C 5.4 11/12/2020   Lab Results  Component Value Date   INSULIN 18.6 04/28/2022   INSULIN 14.8 11/09/2021   INSULIN 22.0 06/29/2021   INSULIN 24.2 03/02/2021   INSULIN 18.4 11/12/2020   Lab Results  Component Value  Date   TSH 1.940 04/28/2022   Lab Results  Component Value Date   CHOL 175 04/28/2022   HDL 59 04/28/2022   LDLCALC 99 04/28/2022   TRIG 94 04/28/2022   CHOLHDL 3.2 06/29/2021   Lab Results  Component Value Date   VD25OH 49.5 04/28/2022   VD25OH 50.9 11/09/2021   VD25OH 54.1 06/29/2021   Lab Results  Component Value Date   WBC 8.4 04/28/2022   HGB 12.8 04/28/2022   HCT 39.4 04/28/2022   MCV 85 04/28/2022   PLT 266 04/28/2022   No results found for: "IRON", "TIBC", "FERRITIN"  Attestation Statements:   Reviewed by  clinician on day of visit: allergies, medications, problem list, medical history, surgical history, family history, social history, and previous encounter notes.  I, Davy Pique, RMA, am acting as Location manager for [employer], DO.   I have reviewed the above documentation for accuracy and completeness, and I agree with the above. Marjory Sneddon, D.O.  The [place] was signed into law in 2016 which includes the topic of electronic health records.  This provides immediate access to information in MyChart.  This includes consultation notes, operative notes, office notes, lab results and pathology reports.  If you have any questions about what you read please let us know at your next visit so we can discuss your concerns and take corrective action if need be.  We are right here with you.

## 2022-08-26 ENCOUNTER — Ambulatory Visit (INDEPENDENT_AMBULATORY_CARE_PROVIDER_SITE_OTHER): Payer: 59 | Admitting: Family Medicine

## 2022-08-26 ENCOUNTER — Encounter (INDEPENDENT_AMBULATORY_CARE_PROVIDER_SITE_OTHER): Payer: Self-pay | Admitting: Family Medicine

## 2022-08-26 VITALS — BP 135/88 | HR 97 | Temp 98.4°F | Ht 64.0 in | Wt 282.2 lb

## 2022-08-26 DIAGNOSIS — G44229 Chronic tension-type headache, not intractable: Secondary | ICD-10-CM | POA: Insufficient documentation

## 2022-08-26 DIAGNOSIS — F509 Eating disorder, unspecified: Secondary | ICD-10-CM | POA: Insufficient documentation

## 2022-08-26 DIAGNOSIS — E559 Vitamin D deficiency, unspecified: Secondary | ICD-10-CM | POA: Diagnosis not present

## 2022-08-26 DIAGNOSIS — F39 Unspecified mood [affective] disorder: Secondary | ICD-10-CM

## 2022-08-26 DIAGNOSIS — Z6841 Body Mass Index (BMI) 40.0 and over, adult: Secondary | ICD-10-CM

## 2022-08-26 DIAGNOSIS — E88819 Insulin resistance, unspecified: Secondary | ICD-10-CM

## 2022-08-26 MED ORDER — BUPROPION HCL ER (SR) 150 MG PO TB12: ORAL_TABLET | ORAL | 0 refills | Status: DC

## 2022-08-26 MED ORDER — VITAMIN D (ERGOCALCIFEROL) 1.25 MG (50000 UNIT) PO CAPS: ORAL_CAPSULE | ORAL | 0 refills | Status: DC

## 2022-08-26 MED ORDER — METFORMIN HCL 500 MG PO TABS: ORAL_TABLET | ORAL | 0 refills | Status: DC

## 2022-08-26 MED ORDER — TOPIRAMATE 50 MG PO TABS: ORAL_TABLET | ORAL | 0 refills | Status: DC

## 2022-08-26 NOTE — Progress Notes (Signed)
Molly Cherry, D.O.  ABFM, ABOM Specializing in Clinical Bariatric Medicine  Office located at: [address], [place]  [address]     Assessment and Plan:   Medications Discontinued During This Encounter  Medication Reason   buPROPion (WELLBUTRIN SR) 150 MG 12 hr tablet Reorder   Vitamin D, Ergocalciferol, (DRISDOL) 1.25 MG (50000 UNIT) CAPS capsule Reorder   metFORMIN (GLUCOPHAGE) 500 MG tablet Reorder     Meds ordered this encounter  Medications   buPROPion (WELLBUTRIN SR) 150 MG 12 hr tablet    Sig: 1 po q am and 1 po q late afternoon    Dispense:  60 tablet    Refill:  0    Ov for rf   Vitamin D, Ergocalciferol, (DRISDOL) 1.25 MG (50000 UNIT) CAPS capsule    Sig: 1 po q wed and 1 po q sun    Dispense:  8 capsule    Refill:  0    Please dispense 30 d supply--> 8 tabs each month;  30 d supply;  ** OV for RF **   Do not send RF request   metFORMIN (GLUCOPHAGE) 500 MG tablet    Sig: 1 po with lunch daily    Dispense:  30 tablet    Refill:  0    30 d supply;  ** OV for RF **   Do not send RF request   topiramate (TOPAMAX) 50 MG tablet    Sig: One half tab by mouth daily for a week, then one tab by mouth daily.    Dispense:  30 tablet    Refill:  0     Insulin resistance Assessment: Condition is Stable.  Lab Results  Component Value Date   HGBA1C 5.5 04/28/2022  Her last insulin was 18.6 on 04/28/22. She is tolerating Metformin '500mg'$  daily well without side effects. Medication compliance is good as patient endorses taking it as prescribed.   Plan: Continue Metformin '500mg'$  daily. Will continue to monitor labs routinely.   Vitamin D deficiency Assessment: Condition is Stable. She has been compliance with Ergocalciferol 50K IU twice weekly. She is tolerating this well without any negative side effects. Lab Results  Component Value Date   VD25OH 49.5 04/28/2022    Plan: Continue Ergocalciferol 50K IU twice weekly.    Mood disorder- emotional  eating Assessment: Condition is Stable. She has been struggling with emotional eating and cravings given recent work stressors and illness. She has been taking Wellbutrin SR '150mg'$  BID and is tolerating this well without negative side effects. However, she feels that she needs additional help with her emotional eating.  Plan:We discussed additional medications such as Topamax and life counseling. She would like to try Topamax in addition to the Wellbutrin. Will start Topamax '50mg'$  daily.   BMI 45.0-49.9, adult ([name])-CURRENT BMI 48.4 Morbid obesity ([name])-START BMI 47.55 Assessment: Condition is Stable. She has been struggling to follow her category 2 meal plan with recent life stressors. She has started packing her lunch again and would like to progress back into her meal plan and begin exercising again.  Plan: Will continue Metformin '500mg'$  daily. Advised to follow prudent nutritional plan- category 2 meal plan. She has also set a goal to start going to her gym 3 days a week at lunch.    Chronic tension-type headaches Assessment: Condition is New. She has chronic intermittent headaches that start on the side of her head and radiate across the front of her head. These are  often triggered by work stress.  Plan: We discussed starting Topamax and reviewed that it may decrease the effectiveness of her birth control.  The risks and benefits of medications were discussed with [facility], including alternative treatment options and all questions were answered.  Pt understands these are in addition to appropriate dietary and lifestyle changes as the first line treatment of this medical condition.   Patient encouraged to further educate self about medication prior to starting.  she will contact us with any further questions or concerns.  Utilizing shared decision making we have agreed to start Topamax '50mg'$  daily.    TREATMENT PLAN FOR OBESITY:  Recommended Dietary Goals Holiday is currently in the action  stage of change. As such, her goal is to continue weight management plan. She has agreed to the Category 2 Plan but pt will try to journal as well.   Behavioral Intervention We discussed the following Behavioral Modification Strategies today: increasing lean protein intake, increasing vegetables, increasing water intake, work on meal planning and easy cooking plans, emotional eating strategies and understanding the difference between hunger signals and cravings, and work on managing stress, creating time for self-care and relaxation measures. Additional resources provided today: Patient declined Evidence-based interventions for health behavior change were utilized today including the discussion of self monitoring techniques, problem-solving barriers and SMART goal setting techniques.   Regarding patient's less desirable eating habits and patterns, we employed the technique of small changes.  Pt will specifically work on: Starting a gym routine, going to the gym 3 days a week at lunchtime for next visit.    Recommended Physical Activity Goals Molly Cherry has been advised to work up to 150 minutes of moderate intensity aerobic activity a week and strengthening exercises 2-3 times per week for cardiovascular health, weight loss maintenance and preservation of muscle mass.  She has agreed to increase physical activity in their day and reduce sedentary time (increase NEAT).    FOLLOW UP: Follow up as scheduled. She was informed of the importance of frequent follow up visits to maximize her success with intensive lifestyle modifications for her multiple health conditions.  Weight Summary and Biometrics   Weight Lost Since Last Visit: +4  Vitals Temp: 98.4 F (36.9 C) BP: 135/88 Pulse Rate: 97 SpO2: 98 %   Anthropometric Measurements Height: '5\' 4"'$  (1.626 m) Weight: 282 lb 3.2 oz (128 kg) BMI (Calculated): 48.42 Weight at Last Visit: 278lb Weight Lost Since Last Visit: +4 Starting Weight:  277lb Total Weight Loss (lbs): 30 lb (13.6 kg) Peak Weight: 278lb   Body Composition  Body Fat %: 52.8 % Fat Mass (lbs): 149 lbs Muscle Mass (lbs): 126.4 lbs Total Body Water (lbs): 103.6 lbs Visceral Fat Rating : 15   Other Clinical Data Fasting: no Labs: no Today's Visit #: [id number] Starting Date: 11/12/20    Subjective:   Chief complaint: Obesity Molly Cherry is here to discuss her progress with her obesity treatment plan. She is on the the Category 2 Plan with breakfast and lunch options and states she is following her eating plan approximately 20 % of the time. She states she is walking on the treadmill 30 minutes 1 days per week.  Interval History:  Since last office visit she states that she has been struggling to follow her meal plan. She is also dealing with work stressors; tearful.  She was ill with the flu for a week since her last visit. With all of these stressors she had an increase in her emotional eating  and cravings.  She has been trying to get back into her routine with packing lunches this past week. She has been compliant with Metformin '500mg'$  daily with lunch.  She often has tension-type headaches that start on the side of her head and radiate to the front. She feels that these are exacerbated by stress as well.  Review of Systems:  Pertinent positives were addressed with patient today.  Objective:   PHYSICAL EXAM:  Blood pressure 135/88, pulse 97, temperature 98.4 F (36.9 C), height '5\' 4"'$  (1.626 m), weight 282 lb 3.2 oz (128 kg), SpO2 98 %. Body mass index is 48.44 kg/m.  General: Well Developed, well nourished, and in no acute distress.  HEENT: Normocephalic, atraumatic Skin: Warm and dry, cap RF less 2 sec, good turgor Chest:  Normal excursion, shape, no gross abn Respiratory: speaking in full sentences, no conversational dyspnea NeuroM-Sk: Ambulates w/o assistance, moves * 4 Psych: A and O *3, insight good, mood-full   DIAGNOSTIC DATA  REVIEWED:  BMET    Component Value Date/Time   NA 141 04/28/2022 0849   K 4.9 04/28/2022 0849   CL 105 04/28/2022 0849   CO2 23 04/28/2022 0849   GLUCOSE 87 04/28/2022 0849   GLUCOSE 117 (H) 09/21/2010 1808   BUN 15 04/28/2022 0849   CREATININE 0.82 04/28/2022 0849   CALCIUM 9.1 04/28/2022 0849   GFRNONAA NOT CALCULATED 09/21/2010 1808   GFRAA  09/21/2010 1808    NOT CALCULATED        The eGFR has been calculated using the MDRD equation. This calculation has not been validated in all clinical situations. eGFR's persistently <60 mL/min signify possible Chronic Kidney Disease.   Lab Results  Component Value Date   HGBA1C 5.5 04/28/2022   HGBA1C 5.4 11/12/2020   Lab Results  Component Value Date   INSULIN 18.6 04/28/2022   INSULIN 18.4 11/12/2020   Lab Results  Component Value Date   TSH 1.940 04/28/2022   CBC    Component Value Date/Time   WBC 8.4 04/28/2022 0849   WBC 18.1 (H) 09/21/2010 1808   RBC 4.66 04/28/2022 0849   RBC 4.87 09/21/2010 1808   HGB 12.8 04/28/2022 0849   HCT 39.4 04/28/2022 0849   PLT 266 04/28/2022 0849   MCV 85 04/28/2022 0849   MCH 27.5 04/28/2022 0849   MCH 28.5 09/21/2010 1808   MCHC 32.5 04/28/2022 0849   MCHC 35.0 09/21/2010 1808   RDW 13.3 04/28/2022 0849   Iron Studies No results found for: "IRON", "TIBC", "FERRITIN", "IRONPCTSAT" Lipid Panel     Component Value Date/Time   CHOL 175 04/28/2022 0849   TRIG 94 04/28/2022 0849   HDL 59 04/28/2022 0849   CHOLHDL 3.2 06/29/2021 0912   LDLCALC 99 04/28/2022 0849   Hepatic Function Panel     Component Value Date/Time   PROT 7.1 04/28/2022 0849   ALBUMIN 4.3 04/28/2022 0849   AST 18 04/28/2022 0849   ALT 15 04/28/2022 0849   ALKPHOS 106 04/28/2022 0849   BILITOT 0.2 04/28/2022 0849      Component Value Date/Time   TSH 1.940 04/28/2022 0849   Nutritional Lab Results  Component Value Date   VD25OH 49.5 04/28/2022   VD25OH 50.9 11/09/2021   VD25OH 54.1 06/29/2021     Attestations:   Reviewed by clinician on day of visit: allergies, medications, problem list, medical history, surgical history, family history, social history, and previous encounter notes.   Patient was in the office today and  time spent on visit including pre-visit chart review and post-visit care/coordination of care and electronic medical record documentation was 40+ minutes.  50% of the time was in face to face counseling of this patient's medical condition(s) and providing education on treatment options to include the first-line treatment of diet and lifestyle modification.   I,Alexis Herring,acting as a Education administrator for [employer], DO.,have documented all relevant documentation on the behalf of Mellody Dance, DO,as directed by  Mellody Dance, DO while in the presence of Mellody Dance, DO.   I, Mellody Dance, DO, have reviewed all documentation for this visit. The documentation on 08/26/22 for the exam, diagnosis, procedures, and orders are all accurate and complete.

## 2022-08-28 ENCOUNTER — Other Ambulatory Visit (INDEPENDENT_AMBULATORY_CARE_PROVIDER_SITE_OTHER): Payer: Self-pay | Admitting: Family Medicine

## 2022-08-28 DIAGNOSIS — J302 Other seasonal allergic rhinitis: Secondary | ICD-10-CM

## 2022-09-13 ENCOUNTER — Encounter (INDEPENDENT_AMBULATORY_CARE_PROVIDER_SITE_OTHER): Payer: Self-pay | Admitting: Family Medicine

## 2022-09-13 ENCOUNTER — Ambulatory Visit (INDEPENDENT_AMBULATORY_CARE_PROVIDER_SITE_OTHER): Payer: 59 | Admitting: Family Medicine

## 2022-09-13 VITALS — BP 134/98 | HR 98 | Temp 98.7°F | Ht 64.0 in | Wt 275.0 lb

## 2022-09-13 DIAGNOSIS — J302 Other seasonal allergic rhinitis: Secondary | ICD-10-CM

## 2022-09-13 DIAGNOSIS — E88819 Insulin resistance, unspecified: Secondary | ICD-10-CM | POA: Diagnosis not present

## 2022-09-13 DIAGNOSIS — E559 Vitamin D deficiency, unspecified: Secondary | ICD-10-CM

## 2022-09-13 DIAGNOSIS — F39 Unspecified mood [affective] disorder: Secondary | ICD-10-CM

## 2022-09-13 DIAGNOSIS — Z6841 Body Mass Index (BMI) 40.0 and over, adult: Secondary | ICD-10-CM

## 2022-09-13 DIAGNOSIS — G44229 Chronic tension-type headache, not intractable: Secondary | ICD-10-CM | POA: Diagnosis not present

## 2022-09-13 MED ORDER — TOPIRAMATE 50 MG PO TABS: ORAL_TABLET | ORAL | 0 refills | Status: DC

## 2022-09-13 NOTE — Progress Notes (Signed)
Molly Cherry, D.O.  ABFM, ABOM Specializing in Clinical Bariatric Medicine  Office located at: [address], [place]  [address]     Assessment and Plan:   No orders of the defined types were placed in this encounter.   Medications Discontinued During This Encounter  Medication Reason   topiramate (TOPAMAX) 50 MG tablet Reorder     Meds ordered this encounter  Medications   topiramate (TOPAMAX) 50 MG tablet    Sig: one tab by mouth daily.    Dispense:  30 tablet    Refill:  0    Will obtain fasting labs in the next OV  Insulin resistance Assessment: Condition is stable.  Lab Results  Component Value Date   HGBA1C 5.5 04/28/2022   HGBA1C 5.2 11/09/2021   HGBA1C 5.6 06/29/2021   INSULIN 18.6 04/28/2022   INSULIN 14.8 11/09/2021   INSULIN 22.0 06/29/2021  No issues with Metformin 500 mg daily, compliance good. She states that are hunger and cravings are non existent.   Plan: Continue with Metformin 500 mg daily.  - Molly Cherry will continue to work on weight loss, exercise, via their meal plan we devised to help decrease the risk of progressing to diabetes.    Vitamin D Deficiency Assessment: Condition is stable.  Lab Results  Component Value Date   VD25OH 49.5 04/28/2022   VD25OH 50.9 11/09/2021   VD25OH 54.1 06/29/2021  She has been more compliant with Ergocalciferol 50K IU weekly. Denies any side effects.  Plan: Continue with Ergocalciferol 50K IU weekly  - weight loss will likely improve availability of vitamin D, thus encouraged Molly Cherry to continue with meal plan and their weight loss efforts to further improve this condition.  Thus, we will need to monitor levels regularly (every 3-4 mo on average) to keep levels within normal limits and prevent over supplementation.   Season Allergies Assessment: Condition is Controlled.  She reports good compliance and tolerance with Singulair 10 mg daily and Xyzal 10 mg daily. No concerns  Plan:  Continue with Singular 10 mg daily and Xyzal 10 mg daily.    Mood disorder (Molly Cherry)- Emotional eating  Assessment: Condition is stable Denies any SI/HI. Mood is stable. She feels that when she eats better, her mood is better. She denies any problems with her sleep.  No issues with Wellbutrin 150 mg BID. Compliance good.   Plan: Continue with Wellbutrin 150 mg BID. - Reminded patient of the importance of following their prudent nutrition plan and how food can affect mood as well to support emotional wellbeing.    Chronic tension-type headache, not intractable Assessment: Condition is stable. Her headaches have been under control.  She has been compliant with Topamax 50 mg daily. She states that her cravings and hunger are well controlled.   Plan: Continue with Topamax 50 mg daily. Will refill Topamax today.     TREATMENT PLAN FOR OBESITY: BMI 45.0-49.9, adult ([name])-CURRENT BMI 48.4 Morbid obesity ([name])-START BMI 47.55/DATE 11/12/20 Assessment: Condition is Improving, but not optimized.. Biometric data collected today, was reviewed with patient.  Fat mass has decreased by 9.6lb. Muscle mass has increased by 2.4lb. Total body water has decreased by 6.2lb.   Plan: Continue with Category 2 meal plan with journaling. I recommended her to meal prep to help with having healthier dinners. I also gave her a handout for seasoning foods and recipe handout to help her with preparing more tastier dinners.   Behavioral Intervention Additional resources provided today: food  seasoning handout recipe handout Evidence-based interventions for health behavior change were utilized today including the discussion of self monitoring techniques, problem-solving barriers and SMART goal setting techniques.   Regarding patient's less desirable eating habits and patterns, we employed the technique of small changes.  Pt will specifically work on: optimizing the foods she eats during dinner for next visit.     Recommended Physical Activity Goals Molly Cherry has been advised to work up to 150 minutes of moderate intensity aerobic activity a week and strengthening exercises 2-3 times per week for cardiovascular health, weight loss maintenance and preservation of muscle mass.  She has agreed to Continue current level of physical activity    FOLLOW UP: No follow-ups on file. She was informed of the importance of frequent follow up visits to maximize her success with intensive lifestyle modifications for her multiple health conditions.  Subjective:   Chief complaint: Obesity Molly Cherry is here to discuss her progress with her obesity treatment plan. She is on the the Category 2 Plan and journaling and states she is following her eating plan approximately 50% of the time. She states she is not exercising.   Interval History:  Molly Cherry is here for a follow up office visit. Since last office visit she has been mostly following the meal plan. For breakfast she typically has a core protein shake with 42 grams of protein. For lunches she has wraps, cottage cheese, and apples. For dinner, she sometimes eats out and other times follows the meal plan. She has not started  going to the gym since the last office visit. We reviewed her meal plan and all questions were answered. Patient's food recall appears to be accurate and consistent with what is on plan when she is following it.   Pharmacotherapy for weight loss: She is currently taking Wellbutrin, Topamax, and Metformin for medical weight loss.  Denies side effects.    Review of Systems:  Pertinent positives were addressed with patient today.  Weight Summary and Biometrics   Weight Lost Since Last Visit: 7lb  No data recorded   Vitals Temp: 98.7 F (37.1 C) BP: (!) 134/98 Pulse Rate: 98 SpO2: 98 %   Anthropometric Measurements Height: 5\' 4"  (1.626 m) Weight: 275 lb (124.7 kg) BMI (Calculated): 47.18 Weight at Last Visit: 282LB Weight  Lost Since Last Visit: 7lb Starting Weight: 277LB Total Weight Loss (lbs): 2 lb (0.907 kg) Peak Weight: 278LB   Body Composition  Body Fat %: 50.7 % Fat Mass (lbs): 139.4 lbs Muscle Mass (lbs): 128.8 lbs Total Body Water (lbs): 97.4 lbs Visceral Fat Rating : 14   Other Clinical Data Fasting: no Labs: no Today's Visit #: 32 Starting Date: 11/12/20    Objective:   PHYSICAL EXAM:  Blood pressure (!) 134/98, pulse 98, temperature 98.7 F (37.1 C), height 5\' 4"  (1.626 m), weight 275 lb (124.7 kg), SpO2 98 %. Body mass index is 47.2 kg/m.  General: Well Developed, well nourished, and in no acute distress.  HEENT: Normocephalic, atraumatic Skin: Warm and dry, cap RF less 2 sec, good turgor Chest:  Normal excursion, shape, no gross abn Respiratory: speaking in full sentences, no conversational dyspnea NeuroM-Sk: Ambulates w/o assistance, moves * 4 Psych: A and O *3, insight good, mood-full  DIAGNOSTIC DATA REVIEWED:  BMET    Component Value Date/Time   NA 141 04/28/2022 0849   K 4.9 04/28/2022 0849   CL 105 04/28/2022 0849   CO2 23 04/28/2022 0849   GLUCOSE 87 04/28/2022 0849  GLUCOSE 117 (H) 09/21/2010 1808   BUN 15 04/28/2022 0849   CREATININE 0.82 04/28/2022 0849   CALCIUM 9.1 04/28/2022 0849   GFRNONAA NOT CALCULATED 09/21/2010 1808   GFRAA  09/21/2010 1808    NOT CALCULATED        The eGFR has been calculated using the MDRD equation. This calculation has not been validated in all clinical situations. eGFR's persistently <60 mL/min signify possible Chronic Kidney Disease.   Lab Results  Component Value Date   HGBA1C 5.5 04/28/2022   HGBA1C 5.4 11/12/2020   Lab Results  Component Value Date   INSULIN 18.6 04/28/2022   INSULIN 18.4 11/12/2020   Lab Results  Component Value Date   TSH 1.940 04/28/2022   CBC    Component Value Date/Time   WBC 8.4 04/28/2022 0849   WBC 18.1 (H) 09/21/2010 1808   RBC 4.66 04/28/2022 0849   RBC 4.87  09/21/2010 1808   HGB 12.8 04/28/2022 0849   HCT 39.4 04/28/2022 0849   PLT 266 04/28/2022 0849   MCV 85 04/28/2022 0849   MCH 27.5 04/28/2022 0849   MCH 28.5 09/21/2010 1808   MCHC 32.5 04/28/2022 0849   MCHC 35.0 09/21/2010 1808   RDW 13.3 04/28/2022 0849   Iron Studies No results found for: "IRON", "TIBC", "FERRITIN", "IRONPCTSAT" Lipid Panel     Component Value Date/Time   CHOL 175 04/28/2022 0849   TRIG 94 04/28/2022 0849   HDL 59 04/28/2022 0849   CHOLHDL 3.2 06/29/2021 0912   LDLCALC 99 04/28/2022 0849   Hepatic Function Panel     Component Value Date/Time   PROT 7.1 04/28/2022 0849   ALBUMIN 4.3 04/28/2022 0849   AST 18 04/28/2022 0849   ALT 15 04/28/2022 0849   ALKPHOS 106 04/28/2022 0849   BILITOT 0.2 04/28/2022 0849      Component Value Date/Time   TSH 1.940 04/28/2022 0849   Nutritional Lab Results  Component Value Date   VD25OH 49.5 04/28/2022   VD25OH 50.9 11/09/2021   VD25OH 54.1 06/29/2021    Attestations:   Reviewed by clinician on day of visit: allergies, medications, problem list, medical history, surgical history, family history, social history, and previous encounter notes.   Patient was in the office today and time spent on visit including pre-visit chart review and post-visit care/coordination of care and electronic medical record documentation was 30 minutes. 50% of the time was in face to face counseling of this patient's medical condition(s) and providing education on treatment options to include the first-line treatment of diet and lifestyle modification.   I,Special Puri,acting as a Education administrator for [employer], DO.,have documented all relevant documentation on the behalf of Mellody Dance, DO,as directed by  Mellody Dance, DO while in the presence of Mellody Dance, DO.   I, Mellody Dance, DO, have reviewed all documentation for this visit. The documentation on 09/13/22 for the exam, diagnosis, procedures, and orders are all  accurate and complete.

## 2022-09-17 ENCOUNTER — Encounter: Payer: 59 | Admitting: Nurse Practitioner

## 2022-09-20 ENCOUNTER — Ambulatory Visit (INDEPENDENT_AMBULATORY_CARE_PROVIDER_SITE_OTHER): Payer: 59 | Admitting: Family Medicine

## 2022-09-23 NOTE — Progress Notes (Signed)
Complete physical exam   Patient: Molly Cherry   DOB: Nov 18, 2000   22 y.o. Female  MRN: [id number] Visit Date: 09/24/2022    Chief Complaint  Patient presents with   Annual Exam   Gynecologic Exam   Subjective    Molly Cherry is a 22 y.o. female who presents today for a complete physical exam.  She reports consuming a low fat diet.  She generally feels well. She does not have additional problems to discuss today.    HPI  Annual physical with pap smear today  -started Yaz to  help with irregular and heavy menstrual periods.  -routine, fasting labs done at initial visit were within normal limits.  -She denies chest pain, chest pressure, or shortness of breath. She denies headaches or visual disturbances. She denies abdominal pain, nausea, vomiting, or changes in bowel or bladder habits.    Past Medical History:  Diagnosis Date   Known health problems: none    Past Surgical History:  Procedure Laterality Date   APPENDECTOMY     Social History   Socioeconomic History   Marital status: Single    Spouse name: Not on file   Number of children: Not on file   Years of education: Not on file   Highest education level: Not on file  Occupational History   Occupation: Full time Student  Tobacco Use   Smoking status: Never   Smokeless tobacco: Never  Substance and Sexual Activity   Alcohol use: No   Drug use: No   Sexual activity: Never    Partners: Male    Birth control/protection: None  Other Topics Concern   Not on file  Social History Narrative   Not on file   Social Determinants of Health   Financial Resource Strain: Not on file  Food Insecurity: Not on file  Transportation Needs: Not on file  Physical Activity: Not on file  Stress: Not on file  Social Connections: Not on file  Intimate Partner Violence: Not on file   Family Status  Relation Name Status   Mother  (Not Specified)   MGM  (Not Specified)   MGF  (Not Specified)   Family History   Problem Relation Age of Onset   Asthma Mother    Depression Mother    Anxiety disorder Mother    Obesity Mother    Hyperlipidemia Maternal Grandmother    Glaucoma Maternal Grandmother    Hypertension Maternal Grandfather    Epilepsy Maternal Grandfather    Allergies  Allergen Reactions   Latex Rash    Patient Care Team: Carlean Jews, NP as PCP - General (Family Medicine)   Medications: Outpatient Medications Prior to Visit  Medication Sig   buPROPion (WELLBUTRIN SR) 150 MG 12 hr tablet 1 po q am and 1 po q late afternoon   drospirenone-ethinyl estradiol (YAZ) 3-0.02 MG tablet Take 1 tablet by mouth daily.   levocetirizine (XYZAL) 5 MG tablet Take 1 tablet (5 mg total) by mouth every evening.   metFORMIN (GLUCOPHAGE) 500 MG tablet 1 po with lunch daily   montelukast (SINGULAIR) 10 MG tablet Take 1 tablet (10 mg total) by mouth at bedtime.   topiramate (TOPAMAX) 50 MG tablet one tab by mouth daily.   vitamin B-12 (CYANOCOBALAMIN) 500 MCG tablet Take 1 tablet (500 mcg total) by mouth daily.   Vitamin D, Ergocalciferol, (DRISDOL) 1.25 MG (50000 UNIT) CAPS capsule 1 po q wed and 1 po q sun   No facility-administered medications prior to  visit.    Review of Systems See HPI    Last CBC Lab Results  Component Value Date   WBC 8.4 04/28/2022   HGB 12.8 04/28/2022   HCT 39.4 04/28/2022   MCV 85 04/28/2022   MCH 27.5 04/28/2022   RDW 13.3 04/28/2022   PLT 266 04/28/2022   Last metabolic panel Lab Results  Component Value Date   GLUCOSE 87 04/28/2022   NA 141 04/28/2022   K 4.9 04/28/2022   CL 105 04/28/2022   CO2 23 04/28/2022   BUN 15 04/28/2022   CREATININE 0.82 04/28/2022   EGFR 105 04/28/2022   CALCIUM 9.1 04/28/2022   PROT 7.1 04/28/2022   ALBUMIN 4.3 04/28/2022   LABGLOB 2.8 04/28/2022   AGRATIO 1.5 04/28/2022   BILITOT 0.2 04/28/2022   ALKPHOS 106 04/28/2022   AST 18 04/28/2022   ALT 15 04/28/2022   Last lipids Lab Results  Component Value Date    CHOL 175 04/28/2022   HDL 59 04/28/2022   LDLCALC 99 04/28/2022   TRIG 94 04/28/2022   CHOLHDL 3.2 06/29/2021   Last hemoglobin A1c Lab Results  Component Value Date   HGBA1C 5.5 04/28/2022   Last thyroid functions Lab Results  Component Value Date   TSH 1.940 04/28/2022   T3TOTAL 158 11/12/2020   Last vitamin D Lab Results  Component Value Date   VD25OH 49.5 04/28/2022   Last vitamin B12 and Folate Lab Results  Component Value Date   VITAMINB12 344 11/09/2021   FOLATE 5.6 11/12/2020       Objective     Today's Vitals   09/24/22 0816  BP: 121/88  Pulse: 71  SpO2: 98%  Weight: 281 lb 12.8 oz (127.8 kg)  Height: 5\' 4"  (1.626 m)   Body mass index is 48.37 kg/m.  BP Readings from Last 3 Encounters:  09/24/22 121/88  09/13/22 (Abnormal) 134/98  08/26/22 135/88    Wt Readings from Last 3 Encounters:  09/24/22 281 lb 12.8 oz (127.8 kg)  09/13/22 275 lb (124.7 kg)  08/26/22 282 lb 3.2 oz (128 kg)     Physical Exam Vitals and nursing note reviewed. Exam conducted with a chaperone present.  Constitutional:      Appearance: Normal appearance. She is well-developed. She is obese.  HENT:     Head: Normocephalic and atraumatic.     Right Ear: Tympanic membrane, ear canal and external ear normal.     Left Ear: Tympanic membrane, ear canal and external ear normal.     Nose: Nose normal.     Mouth/Throat:     Mouth: Mucous membranes are moist.     Pharynx: Oropharynx is clear.  Eyes:     Extraocular Movements: Extraocular movements intact.     Conjunctiva/sclera: Conjunctivae normal.     Pupils: Pupils are equal, round, and reactive to light.  Neck:     Vascular: No carotid bruit.  Cardiovascular:     Rate and Rhythm: Normal rate and regular rhythm.     Pulses: Normal pulses.     Heart sounds: Normal heart sounds.  Pulmonary:     Effort: Pulmonary effort is normal.     Breath sounds: Normal breath sounds.  Chest:  Breasts:    Right: Normal. No  swelling, bleeding, inverted nipple, mass, nipple discharge, skin change or tenderness.     Left: Normal. No swelling, bleeding, inverted nipple, mass, nipple discharge, skin change or tenderness.  Abdominal:     General: Bowel sounds are  normal. There is no distension.     Palpations: Abdomen is soft. There is no mass.     Tenderness: There is no abdominal tenderness. There is no right CVA tenderness, left CVA tenderness, guarding or rebound.     Hernia: No hernia is present. There is no hernia in the left inguinal area or right inguinal area.  Genitourinary:    General: Normal vulva.     Exam position: Supine.     Labia:        Right: No rash, tenderness, lesion or injury.        Left: No rash, tenderness, lesion or injury.      Vagina: Normal. No signs of injury and foreign body. No vaginal discharge, erythema, tenderness, bleeding, lesions or prolapsed vaginal walls.     Cervix: No cervical motion tenderness, discharge, friability, lesion, erythema, cervical bleeding or eversion.     Uterus: Normal.      Adnexa: Right adnexa normal and left adnexa normal.     Comments: No tenderness, masses, or organomeglay present during bimanual exam .   Musculoskeletal:        General: Normal range of motion.     Cervical back: Normal range of motion and neck supple.  Lymphadenopathy:     Cervical: No cervical adenopathy.     Upper Body:     Right upper body: No axillary adenopathy.     Left upper body: No axillary adenopathy.     Lower Body: No right inguinal adenopathy. No left inguinal adenopathy.  Skin:    General: Skin is warm and dry.     Capillary Refill: Capillary refill takes less than 2 seconds.     Findings: Rash present.     Comments: Patchy areas of red and inflamed hair follicles present on the skin of bilateral inner thighs and axillary areas of both arms.   Neurological:     General: No focal deficit present.     Mental Status: She is alert and oriented to person, place, and  time.  Psychiatric:        Mood and Affect: Mood normal.        Behavior: Behavior normal.        Thought Content: Thought content normal.        Judgment: Judgment normal.     Last depression screening scores   Row Labels 09/24/2022    8:29 AM 07/06/2022    3:37 PM 05/18/2022    2:27 PM  PHQ 2/9 Scores   Section Header. No data exists in this row.     PHQ - 2 Score   0 0 1  PHQ- 9 Score   2 3 6    Last fall risk screening   Row Labels 07/06/2022    3:37 PM  Fall Risk    Section Header. No data exists in this row.   Falls in the past year?   0  Number falls in past yr:   0  Injury with Fall?   0  Follow up   Falls evaluation completed     Assessment & Plan    Well woman exam -     Cytology - PAP  Cutaneous candidiasis Assessment & Plan: Trial of Lotrisone cream. Apply twice daily to effected areas when needed. Try to keep areas clean and dry.   Orders: -     Clotrimazole-Betamethasone; Apply 1 Application topically 2 (two) times daily.  Dispense: 45 g; Refill: 2  Dysmenorrhea Assessment & Plan:  Improved. Continue with OCP as previously prescribed.    Morbid obesity ([name])-START BMI 47.55 Assessment & Plan: Patient is followed by [facility] for weight management.    Screening for STD (sexually transmitted disease) -     GC/Chlamydia Probe Amp; Future    Immunization History  Administered Date(s) Administered   DTaP 09/12/2001, 11/20/2001, 01/22/2002, 10/29/2002, 08/25/2006   HIB (PRP-OMP) 09/12/2001, 11/20/2001, 01/22/2002, 10/29/2002   HPV Quadrivalent 02/07/2013, 04/13/2013, 09/05/2013   Hepatitis A, Ped/Adol-2 Dose 07/19/2007, 02/07/2013   Hepatitis B, PED/ADOLESCENT 05-05-2002, 09/12/2001, 05/14/2002   IPV 09/12/2001, 11/20/2001, 05/14/2002, 08/25/2006   Influenza,inj,Quad PF,6+ Mos 04/13/2013, 02/09/2017, 02/13/2019   MMR 07/30/2002, 08/25/2006   Meningococcal B, OMV 04/17/2018   Meningococcal Conjugate 02/07/2013, 02/10/2018    Moderna Sars-Covid-2 Vaccination 10/14/2019, 11/11/2019   Pneumococcal Conjugate-13 09/12/2001, 11/20/2001, 01/22/2002   Tdap 02/07/2013   Varicella 07/30/2002, 08/25/2006    Health Maintenance  Topic Date Due   HIV Screening  Never done   Hepatitis C Screening  Never done   COVID-19 Vaccine (3 - 2023-24 season) 02/19/2022   PAP-Cervical Cytology Screening  Never done   PAP SMEAR-Modifier  Never done   INFLUENZA VACCINE  01/20/2023   DTaP/Tdap/Td (7 - Td or Tdap) 02/08/2023   HPV VACCINES  Completed    Discussed health benefits of physical activity, and encouraged her to engage in regular exercise appropriate for her age and condition.    Return in about 1 year (around 09/24/2023) for health maintenance exam, FBW a week prior to visit.        Carlean Jews, NP  [facility] [phone] (phone) [phone] (fax)  [facility]

## 2022-09-24 ENCOUNTER — Ambulatory Visit (INDEPENDENT_AMBULATORY_CARE_PROVIDER_SITE_OTHER): Payer: 59 | Admitting: Nurse Practitioner

## 2022-09-24 ENCOUNTER — Other Ambulatory Visit (HOSPITAL_COMMUNITY)
Admission: RE | Admit: 2022-09-24 | Discharge: 2022-09-24 | Disposition: A | Discharge status: Home / Self Care | Payer: 59 | Source: Ambulatory Visit | Attending: Nurse Practitioner | Admitting: Nurse Practitioner

## 2022-09-24 ENCOUNTER — Encounter: Payer: Self-pay | Admitting: Nurse Practitioner

## 2022-09-24 VITALS — BP 121/88 | HR 71 | Ht 64.0 in | Wt 281.8 lb

## 2022-09-24 DIAGNOSIS — Z113 Encounter for screening for infections with a predominantly sexual mode of transmission: Secondary | ICD-10-CM

## 2022-09-24 DIAGNOSIS — N946 Dysmenorrhea, unspecified: Secondary | ICD-10-CM | POA: Diagnosis not present

## 2022-09-24 DIAGNOSIS — Z01419 Encounter for gynecological examination (general) (routine) without abnormal findings: Secondary | ICD-10-CM | POA: Insufficient documentation

## 2022-09-24 DIAGNOSIS — B372 Candidiasis of skin and nail: Secondary | ICD-10-CM | POA: Insufficient documentation

## 2022-09-24 MED ORDER — CLOTRIMAZOLE-BETAMETHASONE 1-0.05 % EX CREA: 1.0000 | TOPICAL_CREAM | Freq: Two times a day (BID) | CUTANEOUS | 2 refills | Status: AC

## 2022-09-24 NOTE — Assessment & Plan Note (Signed)
Improved. Continue with OCP as previously prescribed.

## 2022-09-24 NOTE — Assessment & Plan Note (Signed)
Trial of Lotrisone cream. Apply twice daily to effected areas when needed. Try to keep areas clean and dry.

## 2022-09-24 NOTE — Assessment & Plan Note (Signed)
Patient is followed by [facility] for weight management.

## 2022-09-27 LAB — CYTOLOGY - PAP
Adequacy: ABSENT
Diagnosis: NEGATIVE

## 2022-09-28 LAB — GC/CHLAMYDIA PROBE AMP
Chlamydia trachomatis, NAA: NEGATIVE
Neisseria Gonorrhoeae by PCR: NEGATIVE

## 2022-10-04 ENCOUNTER — Encounter (INDEPENDENT_AMBULATORY_CARE_PROVIDER_SITE_OTHER): Payer: Self-pay | Admitting: Family Medicine

## 2022-10-04 ENCOUNTER — Ambulatory Visit (INDEPENDENT_AMBULATORY_CARE_PROVIDER_SITE_OTHER): Payer: 59 | Admitting: Family Medicine

## 2022-10-04 VITALS — BP 114/80 | HR 108 | Temp 98.7°F | Ht 64.0 in | Wt 273.0 lb

## 2022-10-04 DIAGNOSIS — E88819 Insulin resistance, unspecified: Secondary | ICD-10-CM

## 2022-10-04 DIAGNOSIS — F39 Unspecified mood [affective] disorder: Secondary | ICD-10-CM | POA: Diagnosis not present

## 2022-10-04 DIAGNOSIS — J302 Other seasonal allergic rhinitis: Secondary | ICD-10-CM

## 2022-10-04 DIAGNOSIS — E559 Vitamin D deficiency, unspecified: Secondary | ICD-10-CM | POA: Diagnosis not present

## 2022-10-04 DIAGNOSIS — Z6841 Body Mass Index (BMI) 40.0 and over, adult: Secondary | ICD-10-CM

## 2022-10-04 MED ORDER — BUPROPION HCL ER (SR) 150 MG PO TB12
ORAL_TABLET | ORAL | 0 refills | Status: DC
Start: 2022-10-04 — End: 2022-10-28

## 2022-10-04 MED ORDER — VITAMIN D (ERGOCALCIFEROL) 1.25 MG (50000 UNIT) PO CAPS
ORAL_CAPSULE | ORAL | 0 refills | Status: DC
Start: 2022-10-04 — End: 2022-10-28

## 2022-10-04 MED ORDER — LEVOCETIRIZINE DIHYDROCHLORIDE 5 MG PO TABS
5.0000 mg | ORAL_TABLET | Freq: Every evening | ORAL | 0 refills | Status: DC
Start: 2022-10-04 — End: 2022-10-28

## 2022-10-04 MED ORDER — METFORMIN HCL 500 MG PO TABS
ORAL_TABLET | ORAL | 0 refills | Status: DC
Start: 2022-10-04 — End: 2022-10-28

## 2022-10-04 NOTE — Progress Notes (Signed)
[name], D.O.  ABFM, ABOM Specializing in Clinical Bariatric Medicine  Office located at: [address] [place], [place]  [address]     Assessment and Plan:   Orders Placed This Encounter  Procedures   VITAMIN D 25 Hydroxy (Vit-D Deficiency, Fractures)   Comprehensive metabolic panel   Insulin, random   Hemoglobin A1c   Magnesium   Vitamin B12   Medications Discontinued During This Encounter  Medication Reason   levocetirizine (XYZAL) 5 MG tablet Reorder   buPROPion (WELLBUTRIN SR) 150 MG 12 hr tablet Reorder   Vitamin D, Ergocalciferol, (DRISDOL) 1.25 MG (50000 UNIT) CAPS capsule Reorder   metFORMIN (GLUCOPHAGE) 500 MG tablet Reorder     Meds ordered this encounter  Medications   metFORMIN (GLUCOPHAGE) 500 MG tablet    Sig: 1 po with lunch daily    Dispense:  30 tablet    Refill:  0    30 d supply;  ** OV for RF **   Do not send RF request   buPROPion (WELLBUTRIN SR) 150 MG 12 hr tablet    Sig: 1 po q am and 1 po q late afternoon    Dispense:  60 tablet    Refill:  0    Ov for rf   Vitamin D, Ergocalciferol, (DRISDOL) 1.25 MG (50000 UNIT) CAPS capsule    Sig: 1 po q wed and 1 po q sun    Dispense:  8 capsule    Refill:  0    Please dispense 30 d supply--> 8 tabs each month;  30 d supply;  ** OV for RF **   Do not send RF request   levocetirizine (XYZAL) 5 MG tablet    Sig: Take 1 tablet (5 mg total) by mouth every evening.    Dispense:  30 tablet    Refill:  0    30 d supply;  ** OV for RF **   Do not send RF request    Mood disorder ([name])- Emotional eating Assessment: Condition is stable. Denies any SI/HI. Mood is stable. No issues with Wellbutrin SR 150 mg daily. Denies any side effects.  Plan: Recheck labs. Continue with med. Will refill this today.   Continue her prudent nutritional plan that is low in simple carbohydrates, saturated fats and trans fats to goal of 5-10% weight loss to achieve significant health benefits.  Pt encouraged to  continually advance exercise and cardiovascular fitness as tolerated throughout weight loss journey.    Insulin resistance Assessment: Condition is Improving, but not optimized. Lab Results  Component Value Date   HGBA1C 5.5 04/28/2022   HGBA1C 5.2 11/09/2021   HGBA1C 5.6 06/29/2021   INSULIN 18.6 04/28/2022   INSULIN 14.8 11/09/2021   INSULIN 22.0 06/29/2021  She reports good compliance and tolerance with Metformin 10 mg daily and Topamax 50 mg daily. Denies any side effects. Patient endorses that her cravings for salts and sweets have decreased significantly. She endorses this may likely be due to the Topamax.   Plan: Recheck labs. Because Patient is on Metformin, we will be checking her Magnesium and B12 levels. Continue with meds. Will refill Metformin today.   - Continue to decrease simple carbs/ sugars; increase fiber and proteins -> follow her meal plan.   Brentney Goldbach will continue to work on weight loss, exercise, via their meal plan we devised to help decrease the risk of progressing to diabetes.    Vitamin D deficiency Assessment: Condition is stable.  Lab Results  Component Value Date   VD25OH 49.5 04/28/2022   VD25OH 50.9 11/09/2021   VD25OH 54.1 06/29/2021  Patient is complaint with Ergocalciferol 50K IU weekly. Denies any side effects.   Plan: recheck labs. Will refill this today. Continue with med.  - weight loss will likely improve availability of vitamin D, thus encouraged Jaedan to continue with meal plan and their weight loss efforts to further improve this condition.  Thus, we will need to monitor levels regularly (every 3-4 mo on average) to keep levels within normal limits and prevent over supplementation.   Seasonal allergies Assessment: Condition is stable.  Patient endorses that her seasonal allergies are controlled. Patient has been compliant with Xyzal 5 mg daily and Singular 10 mg daily. Denies any side effects.  Plan: Recheck labs. Continue with  meds. Will refill Xyzal today.    TREATMENT PLAN FOR OBESITY: BMI 45.0-49.9, adult ([name])-CURRENT BMI 47.2 Morbid obesity ([name])-START BMI 47.55 Assessment: Condition is improving, but not optimized.  Biometric data collected today, was reviewed with patient.  Fat mass has decreased by 9.6lb. Muscle mass has increased by 8lb. Total body water has increased by 4.6lb.   Plan:  Ryley is currently in the action stage of change. As such, her goal is to continue weight management plan. Alayia will work on healthier eating habits and try their best to  Continue with the Category 2 meal plan and Journaling.     Behavioral Intervention Additional resources provided today: patient declined Evidence-based interventions for health behavior change were utilized today including the discussion of self monitoring techniques, problem-solving barriers and SMART goal setting techniques.   Regarding patient's less desirable eating habits and patterns, we employed the technique of small changes.  Pt will specifically work on: journaling her food intake for next visit.    Recommended Physical Activity Goals Ethyle has been advised to work up to 150 minutes of moderate intensity aerobic activity a week and strengthening exercises 2-3 times per week for cardiovascular health, weight loss maintenance and preservation of muscle mass.  She has agreed to Continue current level of physical activity   FOLLOW UP: Return in about 3 weeks (around 10/25/2022). She was informed of the importance of frequent follow up visits to maximize her success with intensive lifestyle modifications for her multiple health conditions.  Nyari Olsson is aware that we will review all of her lab results at our next visit.  She is aware that if anything is critical/ life threatening with the results, we will be contacting her via MyChart prior to the office visit to discuss management.    Subjective:   Chief complaint:  Obesity Lakelyn is here to discuss her progress with her obesity treatment plan. She is on the the Category 2 Plan and Journaling and states she is following her eating plan approximately 50% of the time. She states she is exercising 45 minutes 3 days per week.  Interval History:  Valrie Jia is here for a follow up office visit. Since last office visit she has not been journaling, but she has gone back to the gym. She also states that her dinner has improved a lot because she is eating at home more and eating out less.  Pharmacotherapy for weight loss: She is currently taking  Metformin and Topamax  for medical weight loss.  Denies side effects.    Review of Systems:  Pertinent positives were addressed with patient today.   Weight Summary and Biometrics   Weight Lost Since  Last Visit: 2  Weight Gained Since Last Visit: 0    Vitals Temp: 98.7 F (37.1 C) BP: 114/80 Pulse Rate: (!) 108 SpO2: 98 %   Anthropometric Measurements Height: 5\' 4"  (1.626 m) Weight: 273 lb (123.8 kg) BMI (Calculated): 46.84 Weight at Last Visit: 275 lb Weight Lost Since Last Visit: 2 Weight Gained Since Last Visit: 0 Starting Weight: 277 lb Total Weight Loss (lbs): 34 lb (15.4 kg) Peak Weight: 278 lb   Body Composition  Body Fat %: 47.4 % Fat Mass (lbs): 129.6 lbs Muscle Mass (lbs): 136.8 lbs Total Body Water (lbs): 102 lbs Visceral Fat Rating : 13   Other Clinical Data Fasting: yes Labs: yes Today's Visit #: 33 Starting Date: 11/12/20     Objective:   PHYSICAL EXAM: Blood pressure 114/80, pulse (!) 108, temperature 98.7 F (37.1 C), height 5\' 4"  (1.626 m), weight 273 lb (123.8 kg), last menstrual period 09/23/2022, SpO2 98 %. Body mass index is 46.86 kg/m.  General: Well Developed, well nourished, and in no acute distress.  HEENT: Normocephalic, atraumatic Skin: Warm and dry, cap RF less 2 sec, good turgor Chest:  Normal excursion, shape, no gross abn Respiratory:  speaking in full sentences, no conversational dyspnea NeuroM-Sk: Ambulates w/o assistance, moves * 4 Psych: A and O *3, insight good, mood-full  DIAGNOSTIC DATA REVIEWED:  BMET    Component Value Date/Time   NA 141 04/28/2022 0849   K 4.9 04/28/2022 0849   CL 105 04/28/2022 0849   CO2 23 04/28/2022 0849   GLUCOSE 87 04/28/2022 0849   GLUCOSE 117 (H) 09/21/2010 1808   BUN 15 04/28/2022 0849   CREATININE 0.82 04/28/2022 0849   CALCIUM 9.1 04/28/2022 0849   GFRNONAA NOT CALCULATED 09/21/2010 1808   GFRAA  09/21/2010 1808    NOT CALCULATED        The eGFR has been calculated using the MDRD equation. This calculation has not been validated in all clinical situations. eGFR's persistently <60 mL/min signify possible Chronic Kidney Disease.   Lab Results  Component Value Date   HGBA1C 5.5 04/28/2022   HGBA1C 5.4 11/12/2020   Lab Results  Component Value Date   INSULIN 18.6 04/28/2022   INSULIN 18.4 11/12/2020   Lab Results  Component Value Date   TSH 1.940 04/28/2022   CBC    Component Value Date/Time   WBC 8.4 04/28/2022 0849   WBC 18.1 (H) 09/21/2010 1808   RBC 4.66 04/28/2022 0849   RBC 4.87 09/21/2010 1808   HGB 12.8 04/28/2022 0849   HCT 39.4 04/28/2022 0849   PLT 266 04/28/2022 0849   MCV 85 04/28/2022 0849   MCH 27.5 04/28/2022 0849   MCH 28.5 09/21/2010 1808   MCHC 32.5 04/28/2022 0849   MCHC 35.0 09/21/2010 1808   RDW 13.3 04/28/2022 0849   Iron Studies No results found for: "IRON", "TIBC", "FERRITIN", "IRONPCTSAT" Lipid Panel     Component Value Date/Time   CHOL 175 04/28/2022 0849   TRIG 94 04/28/2022 0849   HDL 59 04/28/2022 0849   CHOLHDL 3.2 06/29/2021 0912   LDLCALC 99 04/28/2022 0849   Hepatic Function Panel     Component Value Date/Time   PROT 7.1 04/28/2022 0849   ALBUMIN 4.3 04/28/2022 0849   AST 18 04/28/2022 0849   ALT 15 04/28/2022 0849   ALKPHOS 106 04/28/2022 0849   BILITOT 0.2 04/28/2022 0849      Component Value  Date/Time   TSH 1.940 04/28/2022 0849  Nutritional Lab Results  Component Value Date   VD25OH 49.5 04/28/2022   VD25OH 50.9 11/09/2021   VD25OH 54.1 06/29/2021    Attestations:   Reviewed by clinician on day of visit: allergies, medications, problem list, medical history, surgical history, family history, social history, and previous encounter notes.   I,Special Puri,acting as a Neurosurgeon for [employer], DO.,have documented all relevant documentation on the behalf of Thomasene Lot, DO,as directed by  Thomasene Lot, DO while in the presence of Thomasene Lot, DO.   I, Thomasene Lot, DO, have reviewed all documentation for this visit. The documentation on 10/04/22 for the exam, diagnosis, procedures, and orders are all accurate and complete.

## 2022-10-05 LAB — INSULIN, RANDOM: INSULIN: 22.1 u[IU]/mL (ref 2.6–24.9)

## 2022-10-05 LAB — HEMOGLOBIN A1C
Est. average glucose Bld gHb Est-mCnc: 108 mg/dL
Hgb A1c MFr Bld: 5.4 % (ref 4.8–5.6)

## 2022-10-05 LAB — COMPREHENSIVE METABOLIC PANEL
ALT: 15 IU/L (ref 0–32)
AST: 14 IU/L (ref 0–40)
Albumin/Globulin Ratio: 1.4 (ref 1.2–2.2)
Albumin: 4 g/dL (ref 4.0–5.0)
Alkaline Phosphatase: 83 IU/L (ref 44–121)
BUN/Creatinine Ratio: 14 (ref 9–23)
BUN: 12 mg/dL (ref 6–20)
Bilirubin Total: 0.2 mg/dL (ref 0.0–1.2)
CO2: 18 mmol/L — ABNORMAL LOW (ref 20–29)
Calcium: 8.8 mg/dL (ref 8.7–10.2)
Chloride: 105 mmol/L (ref 96–106)
Creatinine, Ser: 0.85 mg/dL (ref 0.57–1.00)
Globulin, Total: 2.8 g/dL (ref 1.5–4.5)
Glucose: 86 mg/dL (ref 70–99)
Potassium: 4.1 mmol/L (ref 3.5–5.2)
Sodium: 140 mmol/L (ref 134–144)
Total Protein: 6.8 g/dL (ref 6.0–8.5)
eGFR: 100 mL/min/{1.73_m2} (ref >59)

## 2022-10-05 LAB — VITAMIN B12: Vitamin B-12: 839 pg/mL (ref 232–1245)

## 2022-10-05 LAB — VITAMIN D 25 HYDROXY (VIT D DEFICIENCY, FRACTURES): Vit D, 25-Hydroxy: 67.6 ng/mL (ref 30.0–100.0)

## 2022-10-05 LAB — MAGNESIUM: Magnesium: 1.9 mg/dL (ref 1.6–2.3)

## 2022-10-18 ENCOUNTER — Other Ambulatory Visit (INDEPENDENT_AMBULATORY_CARE_PROVIDER_SITE_OTHER): Payer: Self-pay | Admitting: Family Medicine

## 2022-10-18 DIAGNOSIS — G44229 Chronic tension-type headache, not intractable: Secondary | ICD-10-CM

## 2022-10-28 ENCOUNTER — Encounter (INDEPENDENT_AMBULATORY_CARE_PROVIDER_SITE_OTHER): Payer: Self-pay | Admitting: Family Medicine

## 2022-10-28 ENCOUNTER — Ambulatory Visit (INDEPENDENT_AMBULATORY_CARE_PROVIDER_SITE_OTHER): Payer: 59 | Admitting: Family Medicine

## 2022-10-28 VITALS — BP 121/85 | HR 96 | Temp 98.3°F | Ht 64.0 in | Wt 272.0 lb

## 2022-10-28 DIAGNOSIS — J302 Other seasonal allergic rhinitis: Secondary | ICD-10-CM | POA: Diagnosis not present

## 2022-10-28 DIAGNOSIS — J3089 Other allergic rhinitis: Secondary | ICD-10-CM

## 2022-10-28 DIAGNOSIS — E559 Vitamin D deficiency, unspecified: Secondary | ICD-10-CM

## 2022-10-28 DIAGNOSIS — F39 Unspecified mood [affective] disorder: Secondary | ICD-10-CM | POA: Diagnosis not present

## 2022-10-28 DIAGNOSIS — G44229 Chronic tension-type headache, not intractable: Secondary | ICD-10-CM

## 2022-10-28 DIAGNOSIS — E538 Deficiency of other specified B group vitamins: Secondary | ICD-10-CM

## 2022-10-28 DIAGNOSIS — E88819 Insulin resistance, unspecified: Secondary | ICD-10-CM | POA: Diagnosis not present

## 2022-10-28 DIAGNOSIS — Z6841 Body Mass Index (BMI) 40.0 and over, adult: Secondary | ICD-10-CM

## 2022-10-28 MED ORDER — TOPIRAMATE 50 MG PO TABS: ORAL_TABLET | ORAL | 0 refills | Status: DC

## 2022-10-28 MED ORDER — LEVOCETIRIZINE DIHYDROCHLORIDE 5 MG PO TABS
5.0000 mg | ORAL_TABLET | Freq: Every evening | ORAL | 0 refills | Status: DC
Start: 2022-10-28 — End: 2022-12-09

## 2022-10-28 MED ORDER — MONTELUKAST SODIUM 10 MG PO TABS: 10.0000 mg | ORAL_TABLET | Freq: Every day | ORAL | 0 refills | Status: DC

## 2022-10-28 MED ORDER — VITAMIN D (ERGOCALCIFEROL) 1.25 MG (50000 UNIT) PO CAPS
ORAL_CAPSULE | ORAL | 0 refills | Status: DC
Start: 2022-10-28 — End: 2022-11-18

## 2022-10-28 MED ORDER — ZEPBOUND 2.5 MG/0.5ML ~~LOC~~ SOAJ
2.5000 mg | SUBCUTANEOUS | 0 refills | Status: DC
Start: 2022-10-28 — End: 2022-11-18

## 2022-10-28 MED ORDER — BUPROPION HCL ER (SR) 150 MG PO TB12: ORAL_TABLET | ORAL | 0 refills | Status: DC

## 2022-10-28 MED ORDER — CYANOCOBALAMIN 500 MCG PO TABS
500.0000 ug | ORAL_TABLET | Freq: Every day | ORAL | Status: DC
Start: 2022-10-28 — End: 2022-12-09

## 2022-10-28 MED ORDER — METFORMIN HCL 500 MG PO TABS: ORAL_TABLET | ORAL | 0 refills | Status: DC

## 2022-10-28 NOTE — Progress Notes (Signed)
Molly Cherry, D.O.  ABFM, ABOM Specializing in Clinical Bariatric Medicine  Office located at: [address] [place], [place]  [address]     Assessment and Plan:   Medications Discontinued During This Encounter  Medication Reason   vitamin B-12 (CYANOCOBALAMIN) 500 MCG tablet Reorder   montelukast (SINGULAIR) 10 MG tablet Reorder   topiramate (TOPAMAX) 50 MG tablet Reorder   metFORMIN (GLUCOPHAGE) 500 MG tablet Reorder   buPROPion (WELLBUTRIN SR) 150 MG 12 hr tablet Reorder   Vitamin D, Ergocalciferol, (DRISDOL) 1.25 MG (50000 UNIT) CAPS capsule Reorder   levocetirizine (XYZAL) 5 MG tablet Reorder     Meds ordered this encounter  Medications   metFORMIN (GLUCOPHAGE) 500 MG tablet    Sig: 1 po with lunch daily    Dispense:  30 tablet    Refill:  0    30 d supply;  ** OV for RF **   Do not send RF request   Vitamin D, Ergocalciferol, (DRISDOL) 1.25 MG (50000 UNIT) CAPS capsule    Sig: 1 po q wed and 1 po q sun    Dispense:  8 capsule    Refill:  0    Please dispense 30 d supply--> 8 tabs each month;  30 d supply;  ** OV for RF **   Do not send RF request   tirzepatide (ZEPBOUND) 2.5 MG/0.5ML Pen    Sig: Inject 2.5 mg into the skin once a week.    Dispense:  2 mL    Refill:  0   buPROPion (WELLBUTRIN SR) 150 MG 12 hr tablet    Sig: 1 po q am and 1 po q late afternoon    Dispense:  60 tablet    Refill:  0    Ov for rf   levocetirizine (XYZAL) 5 MG tablet    Sig: Take 1 tablet (5 mg total) by mouth every evening.    Dispense:  30 tablet    Refill:  0    30 d supply;  ** OV for RF **   Do not send RF request   montelukast (SINGULAIR) 10 MG tablet    Sig: Take 1 tablet (10 mg total) by mouth at bedtime.    Dispense:  30 tablet    Refill:  0    30 d supply;  ** OV for RF **   Do not send RF request   topiramate (TOPAMAX) 50 MG tablet    Sig: one tab by mouth daily.    Dispense:  30 tablet    Refill:  0   cyanocobalamin (VITAMIN B12) 500 MCG tablet    Sig:  Take 1 tablet (500 mcg total) by mouth daily.     Insulin resistance Assessment: Condition is improving, but not optimized. Labs were reviewed.  Lab Results  Component Value Date   HGBA1C 5.4 10/04/2022   HGBA1C 5.5 04/28/2022   HGBA1C 5.2 11/09/2021   INSULIN 22.1 10/04/2022   INSULIN 18.6 04/28/2022   INSULIN 14.8 11/09/2021   Lab Results  Component Value Date   VITAMINB12 839 10/04/2022   Lab Results  Component Value Date   CREATININE 0.85 10/04/2022   BUN 12 10/04/2022   NA 140 10/04/2022   K 4.1 10/04/2022   CL 105 10/04/2022   CO2 18 (L) 10/04/2022      Component Value Date/Time   PROT 6.8 10/04/2022 0834   ALBUMIN 4.0 10/04/2022 0834   AST 14 10/04/2022 0834   ALT  15 10/04/2022 0834   ALKPHOS 83 10/04/2022 0834   BILITOT 0.2 10/04/2022 0834  No issues with Metformin 500 mg daily and Topamax 50 mg daily. Denies any side effects. Patient endorses that her hunger and cravings are better controlled. Labs indicate that:  - Her insulin levels have been worsening over time. Has increased from 18.6 to 22.1. Discussed with patient that weight loss will likely improve this value. - A1c levels indicate that she is not prediabetic. .  - Kidney and liver function are normal.  - Her B12 levels are within the recommended range of 500+   Plan: - Continue with Metformin, Topamax, and OTC Vitamin B12 500 mcg as prescribed. Will refill Metformin and Topamax today.   - Continue her prudent nutritional plan that is low in simple carbohydrates, saturated fats and trans fats to goal of 5-10% weight loss to achieve significant health benefits.  Pt encouraged to continually advance exercise and cardiovascular fitness as tolerated throughout weight loss journey.  - We will recheck A1c and fasting insulin level in approximately 3 months from last check, or as deemed appropriate.     Mood disorder ([name])- Emotional eating Assessment: Condition is stable Denies any SI/HI. Mood is  stable. Cravings and hunger are well controlled.  Reports good compliance and tolerance with Wellbutrin SR 150 mg daily. Denies any side effects.  Plan:  - Continue with med. Will refill this today.   -  Continue her prudent nutritional plan that is low in simple carbohydrates, saturated fats and trans fats to goal of 5-10% weight loss to achieve significant health benefits.  - We will continue to monitor closely alongside PCP / other specialists.    Vitamin D deficiency Assessment: Condition is at goal. Labs were reviewed. Lab Results  Component Value Date   VD25OH 67.6 10/04/2022   VD25OH 49.5 04/28/2022   VD25OH 50.9 11/09/2021  - Her Vitamin D levels are within the recommended range of 50-80. - No issues with Ergocalciferol 50K IU weekly. Denies any side effects. - Patient endorses feeling an increase in energy levels.   Plan: - Continue with med. Will refill this today.   - We will continue to monitor levels regularly (every 3-4 mo on average) to keep levels within normal limits and prevent over supplementation.   Seasonal allergies Assessment: Condition is stable. Symptoms are well controlled with Xyzal and Singulair. Patient reports compliance with medications and good tolerance.  Plan: Continue current regiment. Will refill Singulair and Xyzal today.  - Encouraged to shower in evenings and wash face if they have any prolonged exposure to allergens during the day    TREATMENT PLAN FOR OBESITY: BMI 45.0-49.9, adult ([name])-CURRENT BMI 46.67 Morbid obesity ([name])-START BMI 47.55 Assessment:  Chamari Statum is here to discuss her progress with her obesity treatment plan along with follow-up of her obesity related diagnoses. See Medical Weight Management Flowsheet for complete bioelectrical impedance results.  Condition is not optimized.  Since last office visit on 10/04/22 patient's Fat mass has increased by 3.8 lb. Muscle mass has decreased by 4.6 lb. Total body water  has increased by 2 lb.  Counseling done on how various foods will affect these numbers and how to maximize success  Total lbs lost to date: 5 lbs.  Total weight loss percentage to date: 2%  Plan:  - Jessicah will work on healthier eating habits and continue the Category 2 meal plan and Journaling 1500-1600 calories and 110+ grams of protein.  - Begin Zepbound 2.5  mg weekly.  Patient denies a personal history of pancreatitis;  and denies a family history of medullary thyroid carcinoma or multiple endocrine neoplasia type II. We discussed the risks and benefits of Zepbound, which can help [place] in the management of weight loss. I informed the patient that it is unlikely that her insurance will cover this medication since GLP1s are in very high demand and low in supply. If her insurance does coverage the medication, patient is also aware about the difficulties of finding a pharmacy that has the medication available. All questions were answered appropriately.   Behavioral Intervention Additional resources provided today: patient declined Evidence-based interventions for health behavior change were utilized today including the discussion of self monitoring techniques, problem-solving barriers and SMART goal setting techniques.   Regarding patient's less desirable eating habits and patterns, we employed the technique of small changes.  Pt will specifically work on: continue adherence to prudent nutritional plan for next visit.    Recommended Physical Activity Goals  Alaiza has been advised to slowly work up to 150 minutes of moderate intensity aerobic activity a week and strengthening exercises 2-3 times per week for cardiovascular health, weight loss maintenance and preservation of muscle mass.   She has agreed to Continue current level of physical activity   FOLLOW UP: Return in about 3 weeks (around 11/18/2022). She was informed of the importance of frequent follow up visits to maximize her success  with intensive lifestyle modifications for her multiple health conditions.   Subjective:   Chief complaint: Obesity Drenna is here to discuss her progress with her obesity treatment plan. She is on the the Category 2 Plan and Journaling and states she is following her eating plan approximately 50% of the time. She states she is not exercising  Interval History:  Jacquilyn Benoit is here for a follow up office visit.   This is patient's 34 office visit with [place].    Since last office visit:   - Recently returned from a 1 week vacation and during that time was not able to focus on her meal plan.  - After returning from her vacation, she is back on track and is eating 3 measl a day.  - She feels that her snacking has decreased by a lot. - Her hunger and cravings are better controlled.   Pharmacotherapy for weight loss: She is currently taking  Metformin  for medical weight loss.  Denies side effects.    Review of Systems:  Pertinent positives were addressed with patient today.   Weight Summary and Biometrics   Weight Lost Since Last Visit: 1 lb  Weight Gained Since Last Visit: 0    Vitals Temp: 98.3 F (36.8 C) BP: 121/85 Pulse Rate: 96 SpO2: 94 %   Anthropometric Measurements Height: 5\' 4"  (1.626 m) Weight: 272 lb (123.4 kg) BMI (Calculated): 46.67 Weight at Last Visit: 273 lb Weight Lost Since Last Visit: 1 lb Weight Gained Since Last Visit: 0 Starting Weight: 277 lb Total Weight Loss (lbs): 35 lb (15.9 kg) Peak Weight: 278 lb   Body Composition  Body Fat %: 48.9 % Fat Mass (lbs): 133.4 lbs Muscle Mass (lbs): 132.2 lbs Total Body Water (lbs): 104 lbs Visceral Fat Rating : 13   Other Clinical Data Fasting: No Labs: No Today's Visit #: 34 Starting Date: 11/12/20   Objective:   PHYSICAL EXAM: Blood pressure 121/85, pulse 96, temperature 98.3 F (36.8 C), height 5\' 4"  (1.626 m), weight 272 lb (123.4 kg), SpO2 94 %.  Body mass index is 46.69  kg/m.  General: Well Developed, well nourished, and in no acute distress.  HEENT: Normocephalic, atraumatic Skin: Warm and dry, cap RF less 2 sec, good turgor Chest:  Normal excursion, shape, no gross abn Respiratory: speaking in full sentences, no conversational dyspnea NeuroM-Sk: Ambulates w/o assistance, moves * 4 Psych: A and O *3, insight good, mood-full  DIAGNOSTIC DATA REVIEWED:  BMET    Component Value Date/Time   NA 140 10/04/2022 0834   K 4.1 10/04/2022 0834   CL 105 10/04/2022 0834   CO2 18 (L) 10/04/2022 0834   GLUCOSE 86 10/04/2022 0834   GLUCOSE 117 (H) 09/21/2010 1808   BUN 12 10/04/2022 0834   CREATININE 0.85 10/04/2022 0834   CALCIUM 8.8 10/04/2022 0834   GFRNONAA NOT CALCULATED 09/21/2010 1808   GFRAA  09/21/2010 1808    NOT CALCULATED        The eGFR has been calculated using the MDRD equation. This calculation has not been validated in all clinical situations. eGFR's persistently <60 mL/min signify possible Chronic Kidney Disease.   Lab Results  Component Value Date   HGBA1C 5.4 10/04/2022   HGBA1C 5.4 11/12/2020   Lab Results  Component Value Date   INSULIN 22.1 10/04/2022   INSULIN 18.4 11/12/2020   Lab Results  Component Value Date   TSH 1.940 04/28/2022   CBC    Component Value Date/Time   WBC 8.4 04/28/2022 0849   WBC 18.1 (H) 09/21/2010 1808   RBC 4.66 04/28/2022 0849   RBC 4.87 09/21/2010 1808   HGB 12.8 04/28/2022 0849   HCT 39.4 04/28/2022 0849   PLT 266 04/28/2022 0849   MCV 85 04/28/2022 0849   MCH 27.5 04/28/2022 0849   MCH 28.5 09/21/2010 1808   MCHC 32.5 04/28/2022 0849   MCHC 35.0 09/21/2010 1808   RDW 13.3 04/28/2022 0849   Iron Studies No results found for: "IRON", "TIBC", "FERRITIN", "IRONPCTSAT" Lipid Panel     Component Value Date/Time   CHOL 175 04/28/2022 0849   TRIG 94 04/28/2022 0849   HDL 59 04/28/2022 0849   CHOLHDL 3.2 06/29/2021 0912   LDLCALC 99 04/28/2022 0849   Hepatic Function Panel      Component Value Date/Time   PROT 6.8 10/04/2022 0834   ALBUMIN 4.0 10/04/2022 0834   AST 14 10/04/2022 0834   ALT 15 10/04/2022 0834   ALKPHOS 83 10/04/2022 0834   BILITOT 0.2 10/04/2022 0834      Component Value Date/Time   TSH 1.940 04/28/2022 0849   Nutritional Lab Results  Component Value Date   VD25OH 67.6 10/04/2022   VD25OH 49.5 04/28/2022   VD25OH 50.9 11/09/2021    Attestations:   Reviewed by clinician on day of visit: allergies, medications, problem list, medical history, surgical history, family history, social history, and previous encounter notes.   I,Special Puri,acting as a Neurosurgeon for [employer], DO.,have documented all relevant documentation on the behalf of Thomasene Lot, DO,as directed by  Thomasene Lot, DO while in the presence of Thomasene Lot, DO.   I, Thomasene Lot, DO, have reviewed all documentation for this visit. The documentation on 10/28/22 for the exam, diagnosis, procedures, and orders are all accurate and complete.

## 2022-11-18 ENCOUNTER — Encounter (INDEPENDENT_AMBULATORY_CARE_PROVIDER_SITE_OTHER): Payer: Self-pay | Admitting: Family Medicine

## 2022-11-18 ENCOUNTER — Ambulatory Visit (INDEPENDENT_AMBULATORY_CARE_PROVIDER_SITE_OTHER): Payer: 59 | Admitting: Family Medicine

## 2022-11-18 VITALS — BP 122/84 | HR 93 | Temp 97.8°F | Ht 64.0 in | Wt 273.0 lb

## 2022-11-18 DIAGNOSIS — J3089 Other allergic rhinitis: Secondary | ICD-10-CM

## 2022-11-18 DIAGNOSIS — E559 Vitamin D deficiency, unspecified: Secondary | ICD-10-CM | POA: Diagnosis not present

## 2022-11-18 DIAGNOSIS — Z6841 Body Mass Index (BMI) 40.0 and over, adult: Secondary | ICD-10-CM | POA: Diagnosis not present

## 2022-11-18 MED ORDER — MONTELUKAST SODIUM 10 MG PO TABS: 10.0000 mg | ORAL_TABLET | Freq: Every day | ORAL | 0 refills | Status: DC

## 2022-11-18 MED ORDER — VITAMIN D (ERGOCALCIFEROL) 1.25 MG (50000 UNIT) PO CAPS
ORAL_CAPSULE | ORAL | 0 refills | Status: DC
Start: 2022-11-18 — End: 2022-12-09

## 2022-11-18 NOTE — Progress Notes (Signed)
Molly Cherry, D.O.  ABFM, ABOM Specializing in Clinical Bariatric Medicine  Office located at: [address] [place], [place]  [address]     Assessment and Plan:   Medications Discontinued During This Encounter  Medication Reason   tirzepatide (ZEPBOUND) 2.5 MG/0.5ML Pen Cost of medication   Vitamin D, Ergocalciferol, (DRISDOL) 1.25 MG (50000 UNIT) CAPS capsule Reorder   montelukast (SINGULAIR) 10 MG tablet Reorder     Meds ordered this encounter  Medications   Vitamin D, Ergocalciferol, (DRISDOL) 1.25 MG (50000 UNIT) CAPS capsule    Sig: 1 po q wed and 1 po q sun    Dispense:  8 capsule    Refill:  0    Please dispense 30 d supply--> 8 tabs each month;  30 d supply;  ** OV for RF **   Do not send RF request   montelukast (SINGULAIR) 10 MG tablet    Sig: Take 1 tablet (10 mg total) by mouth at bedtime.    Dispense:  30 tablet    Refill:  0    30 d supply;  ** OV for RF **   Do not send RF request     Vitamin D deficiency Assessment: Condition is stable.  Lab Results  Component Value Date   VD25OH 67.6 10/04/2022   VD25OH 49.5 04/28/2022   VD25OH 50.9 11/09/2021  - She has been compliant with Ergocalciferol 50K IU weekly. Denies any adverse effects.  Plan: - Continue with supplement. Will refill this today.   - Continue her prudent nutritional plan that is low in simple carbohydrates, saturated fats and trans fats to goal of 5-10% weight loss to achieve significant health benefits.  Pt encouraged to continually advance exercise and cardiovascular fitness as tolerated throughout weight loss journey.    Environmental and Seasonal allergies Assessment: Condition is stable. Symptoms are well controlled with Xyzal and Singulair. Patient reports compliance with medications and good tolerance.  Plan:  - Continue current regiment. Will refill Singulair today.  - Continue with low inflammatory meal plan.    TREATMENT PLAN FOR OBESITY: BMI 45.0-49.9, adult  ([name])-CURRENT BMI 47.0 Morbid obesity ([name])-START BMI 47.55 Assessment:  Molly Cherry is here to discuss her progress with her obesity treatment plan along with follow-up of her obesity related diagnoses. See Medical Weight Management Flowsheet for complete bioelectrical impedance results.  Condition is improving.  Biometric data collected today, was reviewed with patient.   Since last office visit on 5-9/24 patient's  Muscle mass has increased by 4.8 lb. Fat mass has decreased by 4.4 lb. Total body water has decreased by 0.2 lb.  Counseling done on how various foods will affect these numbers and how to maximize success  Total lbs lost to date: 4  Total weight loss percentage to date: 1.4   Plan:  - Continue the Category 2 meal plan and Journaling 1500-1600 calories and 110+ grams of protein.   - Discussed the importance of journaling accurately for increased mindfulness and awareness.    Behavioral Intervention Additional resources provided today: Food journaling log.  Evidence-based interventions for health behavior change were utilized today including the discussion of self monitoring techniques, problem-solving barriers and SMART goal setting techniques.   Regarding patient's less desirable eating habits and patterns, we employed the technique of small changes.  Pt will specifically work on: journaling her food intake and bringing in food log, continue with making mindful choices when having temptations for next visit.    Recommended Physical  Activity Goals  Brandon has been advised to slowly work up to 150 minutes of moderate intensity aerobic activity a week and strengthening exercises 2-3 times per week for cardiovascular health, weight loss maintenance and preservation of muscle mass.   She has agreed to Continue current level of physical activity    FOLLOW UP: Return in about 3 weeks (around 12/09/2022). She was informed of the importance of frequent follow up visits to  maximize her success with intensive lifestyle modifications for her multiple health conditions.   Subjective:   Chief complaint: Obesity Molly Cherry is here to discuss her progress with her obesity treatment plan. She is on the the Category 2 Plan and states she is following her eating plan approximately 50% of the time. She states she is not exercising.   Interval History:  Molly Cherry is here for a follow up office visit.     Since last office visit:   - Has been on several small trips to the lake/beach.  - Worked on increasing her protein intake and making more mindful/healthier choices.  - Endorses that the Metformin and Topamax are helping with hunger and cravings.  - Her insurance did not offer coverage on Zepbound.   - She is sleeping 7-9 hrs daily.  - Has an upcoming trip to [place] [employer] [place]).   Pharmacotherapy for weight loss: She is currently taking Metformin, Wellbutrin, and Topamax  for medical weight loss.  Denies side effects.    Review of Systems:  Pertinent positives were addressed with patient today.  Weight Summary and Biometrics   Weight Lost Since Last Visit: 0  Weight Gained Since Last Visit: 1 lb    Vitals Temp: 97.8 F (36.6 C) BP: 122/84 Pulse Rate: 93 SpO2: 97 %   Anthropometric Measurements Height: 5\' 4"  (1.626 m) Weight: 273 lb (123.8 kg) BMI (Calculated): 46.84 Weight at Last Visit: 272 lb Weight Lost Since Last Visit: 0 Weight Gained Since Last Visit: 1 lb Starting Weight: 277 lb Peak Weight: 278 lb   Body Composition  Body Fat %: 47.2 % Fat Mass (lbs): 129 lbs Muscle Mass (lbs): 137 lbs Total Body Water (lbs): 103.8 lbs Visceral Fat Rating : 13   Other Clinical Data Fasting: No Labs: No Today's Visit #: 35 Starting Date: 11/12/20   Objective:   PHYSICAL EXAM: Blood pressure 122/84, pulse 93, temperature 97.8 F (36.6 C), height 5\' 4"  (1.626 m), weight 273 lb (123.8 kg), SpO2 97 %. Body mass index is 46.86  kg/m.  General: Well Developed, well nourished, and in no acute distress.  HEENT: Normocephalic, atraumatic Skin: Warm and dry, cap RF less 2 sec, good turgor Chest:  Normal excursion, shape, no gross abn Respiratory: speaking in full sentences, no conversational dyspnea NeuroM-Sk: Ambulates w/o assistance, moves * 4 Psych: A and O *3, insight good, mood-full  DIAGNOSTIC DATA REVIEWED:  BMET    Component Value Date/Time   NA 140 10/04/2022 0834   K 4.1 10/04/2022 0834   CL 105 10/04/2022 0834   CO2 18 (L) 10/04/2022 0834   GLUCOSE 86 10/04/2022 0834   GLUCOSE 117 (H) 09/21/2010 1808   BUN 12 10/04/2022 0834   CREATININE 0.85 10/04/2022 0834   CALCIUM 8.8 10/04/2022 0834   GFRNONAA NOT CALCULATED 09/21/2010 1808   GFRAA  09/21/2010 1808    NOT CALCULATED        The eGFR has been calculated using the MDRD equation. This calculation has not been validated in all clinical situations. eGFR's  persistently <60 mL/min signify possible Chronic Kidney Disease.   Lab Results  Component Value Date   HGBA1C 5.4 10/04/2022   HGBA1C 5.4 11/12/2020   Lab Results  Component Value Date   INSULIN 22.1 10/04/2022   INSULIN 18.4 11/12/2020   Lab Results  Component Value Date   TSH 1.940 04/28/2022   CBC    Component Value Date/Time   WBC 8.4 04/28/2022 0849   WBC 18.1 (H) 09/21/2010 1808   RBC 4.66 04/28/2022 0849   RBC 4.87 09/21/2010 1808   HGB 12.8 04/28/2022 0849   HCT 39.4 04/28/2022 0849   PLT 266 04/28/2022 0849   MCV 85 04/28/2022 0849   MCH 27.5 04/28/2022 0849   MCH 28.5 09/21/2010 1808   MCHC 32.5 04/28/2022 0849   MCHC 35.0 09/21/2010 1808   RDW 13.3 04/28/2022 0849   Iron Studies No results found for: "IRON", "TIBC", "FERRITIN", "IRONPCTSAT" Lipid Panel     Component Value Date/Time   CHOL 175 04/28/2022 0849   TRIG 94 04/28/2022 0849   HDL 59 04/28/2022 0849   CHOLHDL 3.2 06/29/2021 0912   LDLCALC 99 04/28/2022 0849   Hepatic Function Panel      Component Value Date/Time   PROT 6.8 10/04/2022 0834   ALBUMIN 4.0 10/04/2022 0834   AST 14 10/04/2022 0834   ALT 15 10/04/2022 0834   ALKPHOS 83 10/04/2022 0834   BILITOT 0.2 10/04/2022 0834      Component Value Date/Time   TSH 1.940 04/28/2022 0849   Nutritional Lab Results  Component Value Date   VD25OH 67.6 10/04/2022   VD25OH 49.5 04/28/2022   VD25OH 50.9 11/09/2021    Attestations:   Reviewed by clinician on day of visit: allergies, medications, problem list, medical history, surgical history, family history, social history, and previous encounter notes.    I,Special Puri,acting as a Neurosurgeon for [employer], DO.,have documented all relevant documentation on the behalf of Molly Lot, DO,as directed by  Molly Lot, DO while in the presence of Molly Lot, DO.   I, Molly Lot, DO, have reviewed all documentation for this visit. The documentation on 11/18/22 for the exam, diagnosis, procedures, and orders are all accurate and complete.

## 2022-12-09 ENCOUNTER — Ambulatory Visit (INDEPENDENT_AMBULATORY_CARE_PROVIDER_SITE_OTHER): Payer: 59 | Admitting: Family Medicine

## 2022-12-09 ENCOUNTER — Encounter (INDEPENDENT_AMBULATORY_CARE_PROVIDER_SITE_OTHER): Payer: Self-pay | Admitting: Family Medicine

## 2022-12-09 VITALS — BP 134/88 | HR 91 | Temp 98.0°F | Ht 64.0 in | Wt 271.0 lb

## 2022-12-09 DIAGNOSIS — J302 Other seasonal allergic rhinitis: Secondary | ICD-10-CM

## 2022-12-09 DIAGNOSIS — G44229 Chronic tension-type headache, not intractable: Secondary | ICD-10-CM

## 2022-12-09 DIAGNOSIS — E538 Deficiency of other specified B group vitamins: Secondary | ICD-10-CM

## 2022-12-09 DIAGNOSIS — Z6841 Body Mass Index (BMI) 40.0 and over, adult: Secondary | ICD-10-CM

## 2022-12-09 DIAGNOSIS — E88819 Insulin resistance, unspecified: Secondary | ICD-10-CM | POA: Diagnosis not present

## 2022-12-09 DIAGNOSIS — F39 Unspecified mood [affective] disorder: Secondary | ICD-10-CM

## 2022-12-09 DIAGNOSIS — E559 Vitamin D deficiency, unspecified: Secondary | ICD-10-CM

## 2022-12-09 DIAGNOSIS — J3089 Other allergic rhinitis: Secondary | ICD-10-CM | POA: Diagnosis not present

## 2022-12-09 MED ORDER — MONTELUKAST SODIUM 10 MG PO TABS: 10.0000 mg | ORAL_TABLET | Freq: Every day | ORAL | 0 refills | Status: DC

## 2022-12-09 MED ORDER — TOPIRAMATE 50 MG PO TABS: ORAL_TABLET | ORAL | 0 refills | Status: DC

## 2022-12-09 MED ORDER — LEVOCETIRIZINE DIHYDROCHLORIDE 5 MG PO TABS
5.0000 mg | ORAL_TABLET | Freq: Every evening | ORAL | 0 refills | Status: DC
Start: 2022-12-09 — End: 2023-01-24

## 2022-12-09 MED ORDER — METFORMIN HCL 500 MG PO TABS
ORAL_TABLET | ORAL | 0 refills | Status: DC
Start: 2022-12-09 — End: 2023-01-24

## 2022-12-09 MED ORDER — VITAMIN D (ERGOCALCIFEROL) 1.25 MG (50000 UNIT) PO CAPS
ORAL_CAPSULE | ORAL | 0 refills | Status: DC
Start: 2022-12-09 — End: 2023-01-03

## 2022-12-09 MED ORDER — BUPROPION HCL ER (SR) 150 MG PO TB12
ORAL_TABLET | ORAL | 0 refills | Status: DC
Start: 2022-12-09 — End: 2023-01-24

## 2022-12-09 MED ORDER — CYANOCOBALAMIN 500 MCG PO TABS
500.0000 ug | ORAL_TABLET | Freq: Every day | ORAL | Status: AC
Start: 2022-12-09 — End: ?

## 2022-12-09 NOTE — Progress Notes (Signed)
[name], D.O.  ABFM, ABOM Specializing in Clinical Bariatric Medicine  Office located at: [address] [place], [place]  [address]     Assessment and Plan:   Medications Discontinued During This Encounter  Medication Reason   metFORMIN (GLUCOPHAGE) 500 MG tablet Reorder   buPROPion (WELLBUTRIN SR) 150 MG 12 hr tablet Reorder   levocetirizine (XYZAL) 5 MG tablet Reorder   topiramate (TOPAMAX) 50 MG tablet Reorder   cyanocobalamin (VITAMIN B12) 500 MCG tablet Reorder   Vitamin D, Ergocalciferol, (DRISDOL) 1.25 MG (50000 UNIT) CAPS capsule Reorder   montelukast (SINGULAIR) 10 MG tablet Reorder     Meds ordered this encounter  Medications   Vitamin D, Ergocalciferol, (DRISDOL) 1.25 MG (50000 UNIT) CAPS capsule    Sig: 1 po q wed and 1 po q sun    Dispense:  8 capsule    Refill:  0    Please dispense 30 d supply--> 8 tabs each month;  30 d supply;  ** OV for RF **   Do not send RF request   topiramate (TOPAMAX) 50 MG tablet    Sig: one tab by mouth daily.    Dispense:  30 tablet    Refill:  0   montelukast (SINGULAIR) 10 MG tablet    Sig: Take 1 tablet (10 mg total) by mouth at bedtime.    Dispense:  30 tablet    Refill:  0    30 d supply;  ** OV for RF **   Do not send RF request   metFORMIN (GLUCOPHAGE) 500 MG tablet    Sig: 1 po with lunch daily    Dispense:  30 tablet    Refill:  0    30 d supply;  ** OV for RF **   Do not send RF request   levocetirizine (XYZAL) 5 MG tablet    Sig: Take 1 tablet (5 mg total) by mouth every evening.    Dispense:  30 tablet    Refill:  0    30 d supply;  ** OV for RF **   Do not send RF request   cyanocobalamin (VITAMIN B12) 500 MCG tablet    Sig: Take 1 tablet (500 mcg total) by mouth daily.   buPROPion (WELLBUTRIN SR) 150 MG 12 hr tablet    Sig: 1 po q am and 1 po q late afternoon    Dispense:  60 tablet    Refill:  0    Ov for rf     Vitamin D deficiency Assessment: Condition is being treated with  Ergocalciferol 50K IU twice weekly and she is tolerating it well.   Lab Results  Component Value Date   VD25OH 67.6 10/04/2022   VD25OH 49.5 04/28/2022   VD25OH 50.9 11/09/2021   Plan: Continue with prescribed supplement. Will refill ERGO today.    Will continue to monitor levels regularly (every 3-4 mo on average) to keep levels within normal limits and prevent over supplementation.   Chronic tension-type headache, not intractable Assessment: Head-aches are well controlled. No concerns in this regard. Patient has been compliant with Topamax 50 mg daily. Denies any adverse effects. Denies a need for dose increase.   Plan: Continue with Topamax at current dose. Will refill today.  Will continue to monitor symptoms.    Environmental and seasonal allergies Assessment: Condition is well controlled. Pt takes Singulair 10 mg daily and Xyzal 5 mg daily which has improved symptoms and her quality of  life.  Tolerating well, denies concerns.   Plan: Maintain with Singular and Xyzal at current doses. Will refill both today.   Continue with low inflammatory meal plan. Will monitor symptoms as deemed clinically necessary.    Insulin resistance Assessment:  No issues with Metformin 500 mg daily. Denies any N/V/D or other GI upset. Patient denies need for dose change. Feels that hunger and cravings have been mostly well controlled. Every once in a while, she has cravings in the morning.   Lab Results  Component Value Date   HGBA1C 5.4 10/04/2022   HGBA1C 5.5 04/28/2022   HGBA1C 5.2 11/09/2021   INSULIN 22.1 10/04/2022   INSULIN 18.6 04/28/2022   INSULIN 14.8 11/09/2021    Plan: Continue with Metformin at current dose. Will refill  today.     Maryuri will continue to work on weight loss, exercise, via their meal plan we devised to help decrease the risk of progressing to diabetes. We will recheck A1c and fasting insulin level in approximately 3 months from last check, or as deemed  appropriate.    B12 deficiency Assessment: Condition is at goal. Her B12 def is being treated with OTC Vitamin B12 500 mcg daily. Denies any adverse effects.   Lab Results  Component Value Date   VITAMINB12 839 10/04/2022    Plan: Pt advised to maintain with OTC cyanocobalamin as current dosage. Will reorder today.     Continue their prudent nutritional plan and focus on b12 rich foods such as lean red meats; poultry; eggs; seafood; beans, peas, and lentils; nuts and seeds; and soy products. Will continue to monitor condition as deemed clinically necessary.   Mood disorder ([name])- Emotional eating Assessment: Denies any SI/HI. Mood is stable. Sherissa states that her emotional eating is well controlled. Endorses taking Wellbutrin 150 mg BID and is tolerating medication well. No concerns.   Plan: Continue with mood medication. Will refill Wellbutrin today.    Continue her prudent nutritional plan that is low in simple carbohydrates, saturated fats and trans fats to goal of 5-10% weight loss to achieve significant health benefits.  Pt encouraged to continually advance exercise and cardiovascular fitness as tolerated throughout weight loss journey. Will continue to monitor condition closely.    TREATMENT PLAN FOR OBESITY: BMI 45.0-49.9, adult ([name])- current BMI 46.49 Morbid obesity ([name])-START BMI 47.55 Assessment: Lyric Honnold is here to discuss her progress with her obesity treatment plan along with follow-up of her obesity related diagnoses. See Medical Weight Management Flowsheet for complete bioelectrical impedance results.  Condition is not optimized.Biometric data collected today, was reviewed with patient.   Since last office visit on 11/18/22 patient's  Muscle mass has decreased by 3.2 lb. Fat mass has increased by 2 lb. Total body water has decreased by 1 lb.  Counseling done on how various foods will affect these numbers and how to maximize success  Total lbs lost to date:  - 6  Total weight loss percentage to date: 2.17   Plan: Continue the Category 2 meal plan and Journaling 1500-1600 calories and 110+ grams of protein.   - Encouraged pt to journal more consistently and especially on days when eating off plan for increased mindfulness and to gain the proper insight to make better food choices in the future.   Behavioral Intervention Additional resources provided today: patient declined Evidence-based interventions for health behavior change were utilized today including the discussion of self monitoring techniques, problem-solving barriers and SMART goal setting techniques.   Regarding patient's less  desirable eating habits and patterns, we employed the technique of small changes.  Pt will specifically work on: journaling her intake 5 days a wk and increasing movement for next visit.    Recommended Physical Activity Goals  Amaani has been advised to slowly work up to 150 minutes of moderate intensity aerobic activity a week and strengthening exercises 2-3 times per week for cardiovascular health, weight loss maintenance and preservation of muscle mass.   She has agreed to Continue current level of physical activity   FOLLOW UP: Return in about 3 weeks (around 12/30/2022). She was informed of the importance of frequent follow up visits to maximize her success with intensive lifestyle modifications for her multiple health conditions.  Subjective:   Chief complaint: Obesity Calisi is here to discuss her progress with her obesity treatment plan. She is on the the Category 2 Plan and keeping a food journal and adhering to recommended goals of 1500-1600 calories and 110+ protein and states she is following her eating plan approximately 50% of the time. She states she is not exercising  Interval History:  Jakera Maga is here for a follow up office visit.   Since last OV, Kayin has been doing well. She journaled her intake a couple of times and learned  that she was sometimes over in calories and low protein. She was low in protein especially on days when eating out. Her hunger and cravings are mostly well controlled.   Pharmacotherapy for weight loss: She is currently taking Topamax, Wellbutrin, and Metformin  for medical weight loss.  Denies side effects.    Review of Systems:  Pertinent positives were addressed with patient today.  Reviewed by clinician on day of visit: allergies, medications, problem list, medical history, surgical history, family history, social history, and previous encounter notes.  Weight Summary and Biometrics   Weight Lost Since Last Visit: 2lb  No data recorded   Vitals Temp: 98 F (36.7 C) BP: 134/88 Pulse Rate: 91 SpO2: 98 %   Anthropometric Measurements Height: 5\' 4"  (1.626 m) Weight: 271 lb (122.9 kg) BMI (Calculated): 46.49 Weight at Last Visit: 273lb Weight Lost Since Last Visit: 2lb Starting Weight: 277lb Total Weight Loss (lbs): 37 lb (16.8 kg) Peak Weight: 278lb   Body Composition  Body Fat %: 48.2 % Fat Mass (lbs): 131 lbs Muscle Mass (lbs): 133.8 lbs Total Body Water (lbs): 102.8 lbs Visceral Fat Rating : 13   Other Clinical Data Fasting: no Labs: no Today's Visit #: 36 Starting Date: 11/12/20   Objective:   PHYSICAL EXAM: Blood pressure 134/88, pulse 91, temperature 98 F (36.7 C), height 5\' 4"  (1.626 m), weight 271 lb (122.9 kg), SpO2 98 %. Body mass index is 46.52 kg/m.  General: Well Developed, well nourished, and in no acute distress.  HEENT: Normocephalic, atraumatic Skin: Warm and dry, cap RF less 2 sec, good turgor Chest:  Normal excursion, shape, no gross abn Respiratory: speaking in full sentences, no conversational dyspnea NeuroM-Sk: Ambulates w/o assistance, moves * 4 Psych: A and O *3, insight good, mood-full  DIAGNOSTIC DATA REVIEWED:  BMET    Component Value Date/Time   NA 140 10/04/2022 0834   K 4.1 10/04/2022 0834   CL 105 10/04/2022 0834    CO2 18 (L) 10/04/2022 0834   GLUCOSE 86 10/04/2022 0834   GLUCOSE 117 (H) 09/21/2010 1808   BUN 12 10/04/2022 0834   CREATININE 0.85 10/04/2022 0834   CALCIUM 8.8 10/04/2022 0834   GFRNONAA NOT CALCULATED 09/21/2010 1808  GFRAA  09/21/2010 1808    NOT CALCULATED        The eGFR has been calculated using the MDRD equation. This calculation has not been validated in all clinical situations. eGFR's persistently <60 mL/min signify possible Chronic Kidney Disease.   Lab Results  Component Value Date   HGBA1C 5.4 10/04/2022   HGBA1C 5.4 11/12/2020   Lab Results  Component Value Date   INSULIN 22.1 10/04/2022   INSULIN 18.4 11/12/2020   Lab Results  Component Value Date   TSH 1.940 04/28/2022   CBC    Component Value Date/Time   WBC 8.4 04/28/2022 0849   WBC 18.1 (H) 09/21/2010 1808   RBC 4.66 04/28/2022 0849   RBC 4.87 09/21/2010 1808   HGB 12.8 04/28/2022 0849   HCT 39.4 04/28/2022 0849   PLT 266 04/28/2022 0849   MCV 85 04/28/2022 0849   MCH 27.5 04/28/2022 0849   MCH 28.5 09/21/2010 1808   MCHC 32.5 04/28/2022 0849   MCHC 35.0 09/21/2010 1808   RDW 13.3 04/28/2022 0849   Iron Studies No results found for: "IRON", "TIBC", "FERRITIN", "IRONPCTSAT" Lipid Panel     Component Value Date/Time   CHOL 175 04/28/2022 0849   TRIG 94 04/28/2022 0849   HDL 59 04/28/2022 0849   CHOLHDL 3.2 06/29/2021 0912   LDLCALC 99 04/28/2022 0849   Hepatic Function Panel     Component Value Date/Time   PROT 6.8 10/04/2022 0834   ALBUMIN 4.0 10/04/2022 0834   AST 14 10/04/2022 0834   ALT 15 10/04/2022 0834   ALKPHOS 83 10/04/2022 0834   BILITOT 0.2 10/04/2022 0834      Component Value Date/Time   TSH 1.940 04/28/2022 0849   Nutritional Lab Results  Component Value Date   VD25OH 67.6 10/04/2022   VD25OH 49.5 04/28/2022   VD25OH 50.9 11/09/2021    Attestations:   I, Special Puri, acting as a Stage manager for [employer], DO., have compiled all relevant  documentation for today's office visit on behalf of Thomasene Lot, DO, while in the presence of [employer], DO.  I have reviewed the above documentation for accuracy and completeness, and I agree with the above. [name], D.O.  The 21st Century Cures Act was signed into law in 2016 which includes the topic of electronic health records.  This provides immediate access to information in MyChart.  This includes consultation notes, operative notes, office notes, lab results and pathology reports.  If you have any questions about what you read please let us know at your next visit so we can discuss your concerns and take corrective action if need be.  We are right here with you.

## 2022-12-15 ENCOUNTER — Other Ambulatory Visit (INDEPENDENT_AMBULATORY_CARE_PROVIDER_SITE_OTHER): Payer: Self-pay | Admitting: Family Medicine

## 2022-12-15 ENCOUNTER — Other Ambulatory Visit: Payer: Self-pay

## 2022-12-15 DIAGNOSIS — E559 Vitamin D deficiency, unspecified: Secondary | ICD-10-CM

## 2022-12-15 DIAGNOSIS — N946 Dysmenorrhea, unspecified: Secondary | ICD-10-CM

## 2022-12-15 MED ORDER — DROSPIRENONE-ETHINYL ESTRADIOL 3-0.02 MG PO TABS
1.0000 | ORAL_TABLET | Freq: Every day | ORAL | 11 refills | Status: DC
Start: 2022-12-15 — End: 2023-02-28

## 2022-12-16 ENCOUNTER — Other Ambulatory Visit (INDEPENDENT_AMBULATORY_CARE_PROVIDER_SITE_OTHER): Payer: Self-pay | Admitting: Family Medicine

## 2022-12-16 DIAGNOSIS — G44229 Chronic tension-type headache, not intractable: Secondary | ICD-10-CM

## 2022-12-17 ENCOUNTER — Other Ambulatory Visit (INDEPENDENT_AMBULATORY_CARE_PROVIDER_SITE_OTHER): Payer: Self-pay | Admitting: Family Medicine

## 2022-12-17 DIAGNOSIS — F39 Unspecified mood [affective] disorder: Secondary | ICD-10-CM

## 2023-01-03 ENCOUNTER — Encounter (INDEPENDENT_AMBULATORY_CARE_PROVIDER_SITE_OTHER): Payer: Self-pay | Admitting: Family Medicine

## 2023-01-03 ENCOUNTER — Ambulatory Visit (INDEPENDENT_AMBULATORY_CARE_PROVIDER_SITE_OTHER): Payer: Commercial Managed Care - HMO | Admitting: Family Medicine

## 2023-01-03 VITALS — BP 138/80 | HR 100 | Temp 98.6°F | Ht 64.0 in | Wt 275.0 lb

## 2023-01-03 DIAGNOSIS — G44229 Chronic tension-type headache, not intractable: Secondary | ICD-10-CM | POA: Diagnosis not present

## 2023-01-03 DIAGNOSIS — Z6841 Body Mass Index (BMI) 40.0 and over, adult: Secondary | ICD-10-CM

## 2023-01-03 DIAGNOSIS — E559 Vitamin D deficiency, unspecified: Secondary | ICD-10-CM

## 2023-01-03 DIAGNOSIS — J302 Other seasonal allergic rhinitis: Secondary | ICD-10-CM

## 2023-01-03 DIAGNOSIS — E538 Deficiency of other specified B group vitamins: Secondary | ICD-10-CM | POA: Diagnosis not present

## 2023-01-03 DIAGNOSIS — E88819 Insulin resistance, unspecified: Secondary | ICD-10-CM

## 2023-01-03 DIAGNOSIS — J3089 Other allergic rhinitis: Secondary | ICD-10-CM

## 2023-01-03 DIAGNOSIS — F39 Unspecified mood [affective] disorder: Secondary | ICD-10-CM

## 2023-01-03 MED ORDER — VITAMIN D (ERGOCALCIFEROL) 1.25 MG (50000 UNIT) PO CAPS
ORAL_CAPSULE | ORAL | 0 refills | Status: DC
Start: 2023-01-03 — End: 2023-05-18

## 2023-01-03 NOTE — Progress Notes (Signed)
Molly Cherry, D.O.  ABFM, ABOM Specializing in Clinical Bariatric Medicine  Office located at: [address] [place], [place]  [address]     Assessment and Plan:   Medications Discontinued During This Encounter  Medication Reason   Vitamin D, Ergocalciferol, (DRISDOL) 1.25 MG (50000 UNIT) CAPS capsule Reorder   Meds ordered this encounter  Medications   Vitamin D, Ergocalciferol, (DRISDOL) 1.25 MG (50000 UNIT) CAPS capsule    Sig: 1 po q wed and 1 po q sun    Dispense:  24 capsule    Refill:  0    Please dispense 90 d supply.  ** OV for RF **   Do not send RF request     Vitamin D deficiency Assessment: Condition is At goal.  Patient continues Ergocalciferol 50K IU twice weekly. She tolerates this medication with no significant difficulty or adverse side effects.  Lab Results  Component Value Date   VD25OH 67.6 10/04/2022   VD25OH 49.5 04/28/2022   VD25OH 50.9 11/09/2021   Plan: Continue ERGO as directed. Will refill today. Continuing weight loss will likely improve availability of vitamin D, thus encouraged Molly Cherry to continue with meal plan and their weight loss efforts to further improve this condition.  We will need to monitor her levels regularly (every 3-4 mo on average) to keep levels within normal limits and prevent over supplementation.   Chronic tension-type headache, not intractable Assessment: Condition is Controlled. with Topiramate. She notes if she stops taking it then her headaches do reoccur. She denies need for dosage increase.   Plan: Continue Topiramate 50mg  as directed by Dr. Val Eagle. She declines need for refill. We will continue to monitor symptoms alongside PCP.    Insulin resistance (she gets full following the meal plan) Assessment: Condition is Not optimized. Her insulin is under good control as her A1c is under 5.5. She notes that her hunger is well controlled while eating from the prescribed meal plan. She continues Metformin daily and  consistently. She denies any N/V/D and any GI upset.  Lab Results  Component Value Date   HGBA1C 5.4 10/04/2022   HGBA1C 5.5 04/28/2022   HGBA1C 5.2 11/09/2021   INSULIN 22.1 10/04/2022   INSULIN 18.6 04/28/2022   INSULIN 14.8 11/09/2021    Plan: Continue with Metformin 500mg  daily as directed by Dr. Val Eagle. Pt denies need for refill. Anticipatory guidance given. Continue her prudent nutritional plan that is low in simple carbohydrates, saturated fats and trans fats to goal of 5-10% weight loss to achieve significant health benefits.  Pt encouraged to continually advance exercise and cardiovascular fitness as tolerated throughout weight loss journey. We will recheck A1c and fasting insulin level in approximately 3 months from last check, or as deemed appropriate.    B12 deficiency Assessment: Condition is Controlled. Her B12 levels are within the recommended range of 500+. She continues OTC vitamin B12 supplement daily. She tolerates supplement well with no difficulties.  Lab Results  Component Value Date   VITAMINB12 839 10/04/2022   Plan: Continue vitamin B12 supplement as directed by Dr. Val Eagle and continue to eat foods that are rich in B12 as well as the prescribed nutritional meal plan.    Mood disorder ([name])- Emotional eating Assessment: Condition is Controlled.. Denies any SI/HI. Mood is stable. Cravings and hunger are well controlled on category 2 meal plan. She continues Wellbutrin with no significant difficulties.   Plan: Pt continue with Wellbutrin 150mg  BID, denies need for refill.  Pt encouraged to continually advance exercise and cardiovascular fitness as tolerated throughout weight loss journey. Reminded patient of the importance of following their prudent nutrition plan and how food can affect mood as well to support emotional wellbeing. We will continue to monitor closely alongside PCP / other specialists.   TREATMENT PLAN FOR OBESITY: BMI 45.0-49.9, adult ([name])- current  BMI 47.18 Morbid obesity ([name])-START BMI 47.55 Assessment:  Molly Cherry is here to discuss her progress with her obesity treatment plan along with follow-up of her obesity related diagnoses. See Medical Weight Management Flowsheet for complete bioelectrical impedance results. Condition is docourse: improving but not optimized. Biometric data collected today, was reviewed with patient.   Since last office visit on 12/09/2022 patient's  Muscle mass has increased by 3.4lb. Fat mass has decreased by 0.4lb. Total body water has increased by 1.8lb.  Counseling done on how various foods will affect these numbers and how to maximize success  Total lbs lost to date: 2bls Total weight loss percentage to date: 0.72% ( use https://www.[employer].si )  Plan: Continue the Category 2 meal plan and Journaling 1500-1600 calories and 110+ grams of protein.   - Continue to try and increase water intake. Unless pre-existing renal or cardiopulmonary conditions exist which patient was told to limit their fluid intake by another provider, I recommended roughly one half of their weight in pounds, to be the approximate ounces of non-caloric, non-caffeinated beverages they should drink per day; including more if they are engaging in exercise.  Behavioral Intervention Additional resources provided today: patient declined  Evidence-based interventions for health behavior change were utilized today including the discussion of self monitoring techniques, problem-solving barriers and SMART goal setting techniques.   Regarding patient's less desirable eating habits and patterns, we employed the technique of small changes.  Pt will specifically work on: to journal more consistent at least 3 days  week for next visit.    Recommended Physical Activity Goals Molly Cherry has been advised to slowly work up to 150 minutes of moderate intensity aerobic activity a week and strengthening exercises  2-3 times per week for cardiovascular health, weight loss maintenance and preservation of muscle mass.   She has agreed to Continue current level of physical activity   FOLLOW UP: Return in about 3 weeks (around 01/24/2023). She was informed of the importance of frequent follow up visits to maximize her success with intensive lifestyle modifications for her multiple health conditions.  Subjective:   Chief complaint: Obesity Shikara is here to discuss her progress with her obesity treatment plan. She is on the the Category 2 Plan and keeping a food journal and adhering to recommended goals of 1500-1600 calories and 119+ protein and states she is following her eating plan approximately 25% of the time. She states she is not exercising.  Interval History:  Jadelin Eng is here for a follow up office visit.     Since last OV she has tried to journal but couldn't do it consistently. Her goal is to journal at least 5 days in a row and she wants to continue to try and meet that goal. She has made a new goal of not going out to eat 3 days in a week and to cut back. She has also meal prepped her lunch for this week. Pt informed me that she has increased her activity level by walking her dog at the park.   Pharmacotherapy for weight loss: She is currently taking Topamax and Wellbutrin, and Metformin  for medical weight  loss.  Denies side effects.    Review of Systems:  Pertinent positives were addressed with patient today.  Reviewed by clinician on day of visit: allergies, medications, problem list, medical history, surgical history, family history, social history, and previous encounter notes.  Weight Summary and Biometrics   Weight Lost Since Last Visit: 4lb  Weight Gained Since Last Visit: 0lb  Vitals Temp: 98.6 F (37 C) BP: 138/80 Pulse Rate: 100 SpO2: 97 %   Anthropometric Measurements Height: 5\' 4"  (1.626 m) Weight: 275 lb (124.7 kg) BMI (Calculated): 47.18 Weight at Last  Visit: 271lb Weight Lost Since Last Visit: 4lb Weight Gained Since Last Visit: 0lb Starting Weight: 277lb Total Weight Loss (lbs): 33 lb (15 kg) Peak Weight: 278lb   Body Composition  Body Fat %: 47.5 % Fat Mass (lbs): 130.6 lbs Muscle Mass (lbs): 137.2 lbs Total Body Water (lbs): 104.6 lbs Visceral Fat Rating : 13   Other Clinical Data Fasting: no Labs: no Today's Visit #: 37 Starting Date: 11/12/20   Objective:   PHYSICAL EXAM: Blood pressure 138/80, pulse 100, temperature 98.6 F (37 C), height 5\' 4"  (1.626 m), weight 275 lb (124.7 kg), SpO2 97%. Body mass index is 47.2 kg/m.  General: Well Developed, well nourished, and in no acute distress.  HEENT: Normocephalic, atraumatic Skin: Warm and dry, cap RF less 2 sec, good turgor Chest:  Normal excursion, shape, no gross abn Respiratory: speaking in full sentences, no conversational dyspnea NeuroM-Sk: Ambulates w/o assistance, moves * 4 Psych: A and O *3, insight good, mood-full  DIAGNOSTIC DATA REVIEWED:  BMET    Component Value Date/Time   NA 140 10/04/2022 0834   K 4.1 10/04/2022 0834   CL 105 10/04/2022 0834   CO2 18 (L) 10/04/2022 0834   GLUCOSE 86 10/04/2022 0834   GLUCOSE 117 (H) 09/21/2010 1808   BUN 12 10/04/2022 0834   CREATININE 0.85 10/04/2022 0834   CALCIUM 8.8 10/04/2022 0834   GFRNONAA NOT CALCULATED 09/21/2010 1808   GFRAA  09/21/2010 1808    NOT CALCULATED        The eGFR has been calculated using the MDRD equation. This calculation has not been validated in all clinical situations. eGFR's persistently <60 mL/min signify possible Chronic Kidney Disease.   Lab Results  Component Value Date   HGBA1C 5.4 10/04/2022   HGBA1C 5.4 11/12/2020   Lab Results  Component Value Date   INSULIN 22.1 10/04/2022   INSULIN 18.4 11/12/2020   Lab Results  Component Value Date   TSH 1.940 04/28/2022   CBC    Component Value Date/Time   WBC 8.4 04/28/2022 0849   WBC 18.1 (H) 09/21/2010  1808   RBC 4.66 04/28/2022 0849   RBC 4.87 09/21/2010 1808   HGB 12.8 04/28/2022 0849   HCT 39.4 04/28/2022 0849   PLT 266 04/28/2022 0849   MCV 85 04/28/2022 0849   MCH 27.5 04/28/2022 0849   MCH 28.5 09/21/2010 1808   MCHC 32.5 04/28/2022 0849   MCHC 35.0 09/21/2010 1808   RDW 13.3 04/28/2022 0849   Iron Studies No results found for: "IRON", "TIBC", "FERRITIN", "IRONPCTSAT" Lipid Panel     Component Value Date/Time   CHOL 175 04/28/2022 0849   TRIG 94 04/28/2022 0849   HDL 59 04/28/2022 0849   CHOLHDL 3.2 06/29/2021 0912   LDLCALC 99 04/28/2022 0849   Hepatic Function Panel     Component Value Date/Time   PROT 6.8 10/04/2022 0834   ALBUMIN 4.0 10/04/2022 0834  AST 14 10/04/2022 0834   ALT 15 10/04/2022 0834   ALKPHOS 83 10/04/2022 0834   BILITOT 0.2 10/04/2022 0834      Component Value Date/Time   TSH 1.940 04/28/2022 0849   Nutritional Lab Results  Component Value Date   VD25OH 67.6 10/04/2022   VD25OH 49.5 04/28/2022   VD25OH 50.9 11/09/2021    Attestations:   This encounter took 35 total minutes of time including any pre-visit and post-visit time spent on this date of service, including taking a thorough history, reviewing any labs and/or imaging, reviewing prior notes, as well as documenting in the electronic health record on the date of service. Over 50% of that time was in direct face-to-face counseling and coordinating care for the patient today  I, Clinical biochemist, acting as a Stage manager for [employer], DO., have compiled all relevant documentation for today's office visit on behalf of Thomasene Lot, DO, while in the presence of [employer], DO.  I have reviewed the above documentation for accuracy and completeness, and I agree with the above. Molly Cherry, D.O.  The 21st Century Cures Act was signed into law in 2016 which includes the topic of electronic health records.  This provides immediate access to information in MyChart.   This includes consultation notes, operative notes, office notes, lab results and pathology reports.  If you have any questions about what you read please let us know at your next visit so we can discuss your concerns and take corrective action if need be.  We are right here with you.

## 2023-01-24 ENCOUNTER — Encounter (INDEPENDENT_AMBULATORY_CARE_PROVIDER_SITE_OTHER): Payer: Self-pay | Admitting: Family Medicine

## 2023-01-24 ENCOUNTER — Ambulatory Visit (INDEPENDENT_AMBULATORY_CARE_PROVIDER_SITE_OTHER): Payer: Commercial Managed Care - HMO | Admitting: Family Medicine

## 2023-01-24 VITALS — BP 129/82 | HR 87 | Temp 98.6°F | Ht 64.0 in | Wt 273.0 lb

## 2023-01-24 DIAGNOSIS — E88819 Insulin resistance, unspecified: Secondary | ICD-10-CM

## 2023-01-24 DIAGNOSIS — Z6841 Body Mass Index (BMI) 40.0 and over, adult: Secondary | ICD-10-CM

## 2023-01-24 DIAGNOSIS — E669 Obesity, unspecified: Secondary | ICD-10-CM | POA: Diagnosis not present

## 2023-01-24 DIAGNOSIS — F39 Unspecified mood [affective] disorder: Secondary | ICD-10-CM

## 2023-01-24 DIAGNOSIS — J3089 Other allergic rhinitis: Secondary | ICD-10-CM | POA: Diagnosis not present

## 2023-01-24 MED ORDER — MONTELUKAST SODIUM 10 MG PO TABS
10.0000 mg | ORAL_TABLET | Freq: Every day | ORAL | 0 refills | Status: DC
Start: 2023-01-24 — End: 2023-02-17

## 2023-01-24 MED ORDER — METFORMIN HCL 500 MG PO TABS
ORAL_TABLET | ORAL | 0 refills | Status: DC
Start: 2023-01-24 — End: 2023-02-17

## 2023-01-24 MED ORDER — SEMAGLUTIDE-WEIGHT MANAGEMENT 0.25 MG/0.5ML ~~LOC~~ SOAJ
SUBCUTANEOUS | 0 refills | Status: DC
Start: 2023-01-24 — End: 2023-01-25

## 2023-01-24 MED ORDER — BUPROPION HCL ER (SR) 150 MG PO TB12
ORAL_TABLET | ORAL | 0 refills | Status: DC
Start: 2023-01-24 — End: 2023-02-17

## 2023-01-24 MED ORDER — LEVOCETIRIZINE DIHYDROCHLORIDE 5 MG PO TABS: 5.0000 mg | ORAL_TABLET | Freq: Every evening | ORAL | 0 refills | Status: DC

## 2023-01-24 NOTE — Progress Notes (Signed)
[name], D.O.  ABFM, ABOM Specializing in Clinical Bariatric Medicine  Office located at: [address] [place], [place]  [address]     Assessment and Plan:   Medications Discontinued During This Encounter  Medication Reason   montelukast (SINGULAIR) 10 MG tablet Reorder   metFORMIN (GLUCOPHAGE) 500 MG tablet Reorder   levocetirizine (XYZAL) 5 MG tablet Reorder   buPROPion (WELLBUTRIN SR) 150 MG 12 hr tablet Reorder     Meds ordered this encounter  Medications   buPROPion (WELLBUTRIN SR) 150 MG 12 hr tablet    Sig: 1 po q am and 1 po q late afternoon    Dispense:  60 tablet    Refill:  0    Ov for rf   levocetirizine (XYZAL) 5 MG tablet    Sig: Take 1 tablet (5 mg total) by mouth every evening.    Dispense:  30 tablet    Refill:  0    30 d supply;  ** OV for RF **   Do not send RF request   metFORMIN (GLUCOPHAGE) 500 MG tablet    Sig: 1 po with lunch and dinner daily    Dispense:  60 tablet    Refill:  0    30 d supply;  ** OV for RF **   Do not send RF request   montelukast (SINGULAIR) 10 MG tablet    Sig: Take 1 tablet (10 mg total) by mouth at bedtime.    Dispense:  30 tablet    Refill:  0    30 d supply;  ** OV for RF **   Do not send RF request   Semaglutide-Weight Management 0.25 MG/0.5ML SOAJ    Sig: Inject 0.25 mg into the skin once a week for 14 days, THEN 0.5 mg once a week.    Dispense:  4 mL    Refill:  0     Mood disorder ([name])- Emotional eating Assessment & Plan: Denies any SI/HI. Mood is stable. No complaints of emotional eating since last OV. No issues with Wellbutrin SR 150 mg bid, compliance good.   Will refill Wellbutrin today - pt advised to maintain at current dose. Reminded patient of the importance of following their prudent nutrition plan and how food can affect mood as well to support emotional wellbeing. We will continue to monitor closely.   Insulin resistance Assessment & Plan: Condition is being treated with  Metformin 500 mg daily - denies any N/V/D.  Pt endorses having increased hunger and cravings - especially right after dinner.   Lab Results  Component Value Date   HGBA1C 5.4 10/04/2022   HGBA1C 5.5 04/28/2022   HGBA1C 5.2 11/09/2021   INSULIN 22.1 10/04/2022   INSULIN 18.6 04/28/2022   INSULIN 14.8 11/09/2021    Increase Metformin to 500 mg BID and begin Wegovy 0.25 mg once wkly. If Reginal Lutes gets approved by her insurance, pt has been advised to decrease Metformin back to once daily or continue with twice daily if she feels its helpful. Additionally, if Reginal Lutes does not get approved, I recommended Romeka to contact her insurance regarding Saxenda coverage.   Patient denies a personal history of pancreatitis;  and denies a family history of medullary thyroid carcinoma or multiple endocrine neoplasia type II.  I informed the patient that the demand for GLP-1-like medications is high and supply is low, therefore the medication can be difficult to obtain.  Strategies to obtain discussed with patient.  Potential  risks/ benefits of the medication were explained to patient. Explained that the more she eats "off-plan", the more likely for her to have side effects of the drug.  All questions were answered appropriately and alternative treatment options were discussed; patient wishes to move forward with the medication at this time. Will continue to monitor condition.    Environmental and seasonal allergies Assessment & Plan: Her allergy symptoms have been well controlled with Xyzal 5 mg daily and Singulair 10 mg daily.   Will refill Xyzal and Singulair today. Continue with both medications at current doses and our low inflammatory meal plan. Will continue to monitor condition.    TREATMENT PLAN FOR OBESITY: BMI 45.0-49.9, adult ([name]) Obesity with current BMI of 46.84 Assessment & Plan: Chikita Alexis is here to discuss her progress with her obesity treatment plan along with follow-up of her  obesity related diagnoses. See Medical Weight Management Flowsheet for complete bioelectrical impedance results.  Condition is not optimized. Biometric data collected today, was reviewed with patient.   Since last office visit on 01/03/23 patient's muscle mass has decreased by 3.8 lb. Fat mass has increased by 2.8 lb. Total body water has decreased by 0.2 lb. Counseling done on how various foods will affect these numbers and how to maximize success  Total lbs lost to date: - 4 lbs Total weight loss percentage to date: 1.44%    Continue the Category 2 meal plan and Journaling 1500-1600 calories and 110+ grams of protein.   Behavioral Intervention Additional resources provided today:  Appeal Letter Handout Evidence-based interventions for health behavior change were utilized today including the discussion of self monitoring techniques, problem-solving barriers and SMART goal setting techniques.   Regarding patient's less desirable eating habits and patterns, we employed the technique of small changes.  Pt will specifically work on: eating all foods on plan and not skipping meals for next visit.    Recommended Physical Activity Goals Easther has been advised to slowly work up to 150 minutes of moderate intensity aerobic activity a week and strengthening exercises 2-3 times per week for cardiovascular health, weight loss maintenance and preservation of muscle mass. She has agreed to Continue current level of physical activity   FOLLOW UP: Return in about 24 days (around 02/17/2023). She was informed of the importance of frequent follow up visits to maximize her success with intensive lifestyle modifications for her multiple health conditions.  Subjective:   Chief complaint: Obesity Gennavieve is here to discuss her progress with her obesity treatment plan. She is on the Category 2 Plan and keeping a food journal and adhering to recommended goals of 1500-1600 calories and 110+ protein and states  she is following her eating plan approximately 50% of the time. She states she is walking 30 minutes 2 days per week.  Interval History:  Jeana Stepaniak is here for a follow up office visit. Hyla endorses being in the process of obtaining a new job through a Sports [employer]. Her biggest challenges have been hunger/cravings and eating out. She also reports sometimes going long periods without eating when busy and then turning to fast food. Her past insurance did not cover Zepbound;  she obtained a new insurance in July.   Pharmacotherapy for weight loss: She is currently taking  Wellbutrin SR 150 mg bid, Topamax 50 mg daily, and Metformin 500 mg daily   for medical weight loss.  Denies side effects.    Review of Systems:  Pertinent positives were addressed with patient today.  Reviewed  by clinician on day of visit: allergies, medications, problem list, medical history, surgical history, family history, social history, and previous encounter notes.  Weight Summary and Biometrics   Weight Lost Since Last Visit: 2lb  Weight Gained Since Last Visit: 0lb    Vitals Temp: 98.6 F (37 C) BP: 129/82 Pulse Rate: 87 SpO2: 99 %   Anthropometric Measurements Height: 5\' 4"  (1.626 m) Weight: 273 lb (123.8 kg) BMI (Calculated): 46.84 Weight at Last Visit: 275lb Weight Lost Since Last Visit: 2lb Weight Gained Since Last Visit: 0lb Starting Weight: 277lb Total Weight Loss (lbs): 4 lb (1.814 kg) Peak Weight: 278lb   Body Composition  Body Fat %: 48.7 % Fat Mass (lbs): 133.4 lbs Muscle Mass (lbs): 133.4 lbs Total Body Water (lbs): 104.4 lbs Visceral Fat Rating : 13   Other Clinical Data Fasting: no Labs: no Today's Visit #: 38 Starting Date: 11/12/20   Objective:   PHYSICAL EXAM: Blood pressure 129/82, pulse 87, temperature 98.6 F (37 C), height 5\' 4"  (1.626 m), weight 273 lb (123.8 kg), SpO2 99%. Body mass index is 46.86 kg/m.  General: Well Developed, well nourished,  and in no acute distress.  HEENT: Normocephalic, atraumatic Skin: Warm and dry, cap RF less 2 sec, good turgor Chest:  Normal excursion, shape, no gross abn Respiratory: speaking in full sentences, no conversational dyspnea NeuroM-Sk: Ambulates w/o assistance, moves * 4 Psych: A and O *3, insight good, mood-full  DIAGNOSTIC DATA REVIEWED:  BMET    Component Value Date/Time   NA 140 10/04/2022 0834   K 4.1 10/04/2022 0834   CL 105 10/04/2022 0834   CO2 18 (L) 10/04/2022 0834   GLUCOSE 86 10/04/2022 0834   GLUCOSE 117 (H) 09/21/2010 1808   BUN 12 10/04/2022 0834   CREATININE 0.85 10/04/2022 0834   CALCIUM 8.8 10/04/2022 0834   GFRNONAA NOT CALCULATED 09/21/2010 1808   GFRAA  09/21/2010 1808    NOT CALCULATED        The eGFR has been calculated using the MDRD equation. This calculation has not been validated in all clinical situations. eGFR's persistently <60 mL/min signify possible Chronic Kidney Disease.   Lab Results  Component Value Date   HGBA1C 5.4 10/04/2022   HGBA1C 5.4 11/12/2020   Lab Results  Component Value Date   INSULIN 22.1 10/04/2022   INSULIN 18.4 11/12/2020   Lab Results  Component Value Date   TSH 1.940 04/28/2022   CBC    Component Value Date/Time   WBC 8.4 04/28/2022 0849   WBC 18.1 (H) 09/21/2010 1808   RBC 4.66 04/28/2022 0849   RBC 4.87 09/21/2010 1808   HGB 12.8 04/28/2022 0849   HCT 39.4 04/28/2022 0849   PLT 266 04/28/2022 0849   MCV 85 04/28/2022 0849   MCH 27.5 04/28/2022 0849   MCH 28.5 09/21/2010 1808   MCHC 32.5 04/28/2022 0849   MCHC 35.0 09/21/2010 1808   RDW 13.3 04/28/2022 0849   Iron Studies No results found for: "IRON", "TIBC", "FERRITIN", "IRONPCTSAT" Lipid Panel     Component Value Date/Time   CHOL 175 04/28/2022 0849   TRIG 94 04/28/2022 0849   HDL 59 04/28/2022 0849   CHOLHDL 3.2 06/29/2021 0912   LDLCALC 99 04/28/2022 0849   Hepatic Function Panel     Component Value Date/Time   PROT 6.8 10/04/2022  0834   ALBUMIN 4.0 10/04/2022 0834   AST 14 10/04/2022 0834   ALT 15 10/04/2022 0834   ALKPHOS 83 10/04/2022 0834  BILITOT 0.2 10/04/2022 0834      Component Value Date/Time   TSH 1.940 04/28/2022 0849   Nutritional Lab Results  Component Value Date   VD25OH 67.6 10/04/2022   VD25OH 49.5 04/28/2022   VD25OH 50.9 11/09/2021    Attestations:   I, Special Puri , acting as a Stage manager for [employer], DO., have compiled all relevant documentation for today's office visit on behalf of Thomasene Lot, DO, while in the presence of [employer], DO.  I have reviewed the above documentation for accuracy and completeness, and I agree with the above. [name], D.O.  The 21st Century Cures Act was signed into law in 2016 which includes the topic of electronic health records.  This provides immediate access to information in MyChart.  This includes consultation notes, operative notes, office notes, lab results and pathology reports.  If you have any questions about what you read please let us know at your next visit so we can discuss your concerns and take corrective action if need be.  We are right here with you.

## 2023-01-25 ENCOUNTER — Other Ambulatory Visit (INDEPENDENT_AMBULATORY_CARE_PROVIDER_SITE_OTHER): Payer: Self-pay

## 2023-01-25 ENCOUNTER — Telehealth (INDEPENDENT_AMBULATORY_CARE_PROVIDER_SITE_OTHER): Payer: Self-pay | Admitting: Family Medicine

## 2023-01-25 DIAGNOSIS — E88819 Insulin resistance, unspecified: Secondary | ICD-10-CM

## 2023-01-25 DIAGNOSIS — Z6841 Body Mass Index (BMI) 40.0 and over, adult: Secondary | ICD-10-CM

## 2023-01-25 DIAGNOSIS — F39 Unspecified mood [affective] disorder: Secondary | ICD-10-CM

## 2023-01-25 MED ORDER — OZEMPIC (0.25 OR 0.5 MG/DOSE) 2 MG/3ML ~~LOC~~ SOPN: 0.2500 mL | PEN_INJECTOR | SUBCUTANEOUS | 0 refills | Status: DC

## 2023-01-25 NOTE — Telephone Encounter (Signed)
[id number] [employer] Molly Cherry is requesting a new prescription for [employer]. He stated that a new prescription for Semaglutide 0.5mg  is needed as it is a new dose requiring a separate prescription.

## 2023-01-30 ENCOUNTER — Other Ambulatory Visit (INDEPENDENT_AMBULATORY_CARE_PROVIDER_SITE_OTHER): Payer: Self-pay | Admitting: Family Medicine

## 2023-01-30 DIAGNOSIS — F39 Unspecified mood [affective] disorder: Secondary | ICD-10-CM

## 2023-02-17 ENCOUNTER — Ambulatory Visit (INDEPENDENT_AMBULATORY_CARE_PROVIDER_SITE_OTHER): Payer: Commercial Managed Care - HMO | Admitting: Family Medicine

## 2023-02-17 ENCOUNTER — Encounter (INDEPENDENT_AMBULATORY_CARE_PROVIDER_SITE_OTHER): Payer: Self-pay | Admitting: Family Medicine

## 2023-02-17 VITALS — BP 118/85 | HR 92 | Temp 99.2°F | Ht 64.0 in | Wt 271.0 lb

## 2023-02-17 DIAGNOSIS — G44229 Chronic tension-type headache, not intractable: Secondary | ICD-10-CM

## 2023-02-17 DIAGNOSIS — E559 Vitamin D deficiency, unspecified: Secondary | ICD-10-CM

## 2023-02-17 DIAGNOSIS — E669 Obesity, unspecified: Secondary | ICD-10-CM

## 2023-02-17 DIAGNOSIS — E88819 Insulin resistance, unspecified: Secondary | ICD-10-CM | POA: Diagnosis not present

## 2023-02-17 DIAGNOSIS — F39 Unspecified mood [affective] disorder: Secondary | ICD-10-CM

## 2023-02-17 DIAGNOSIS — J3089 Other allergic rhinitis: Secondary | ICD-10-CM | POA: Diagnosis not present

## 2023-02-17 DIAGNOSIS — Z6841 Body Mass Index (BMI) 40.0 and over, adult: Secondary | ICD-10-CM

## 2023-02-17 MED ORDER — MONTELUKAST SODIUM 10 MG PO TABS
10.0000 mg | ORAL_TABLET | Freq: Every day | ORAL | 0 refills | Status: DC
Start: 2023-02-17 — End: 2023-03-10

## 2023-02-17 MED ORDER — LEVOCETIRIZINE DIHYDROCHLORIDE 5 MG PO TABS
5.0000 mg | ORAL_TABLET | Freq: Every evening | ORAL | 0 refills | Status: DC
Start: 2023-02-17 — End: 2023-04-25

## 2023-02-17 MED ORDER — TOPIRAMATE 50 MG PO TABS
ORAL_TABLET | ORAL | 0 refills | Status: DC
Start: 2023-02-17 — End: 2023-04-25

## 2023-02-17 MED ORDER — OZEMPIC (0.25 OR 0.5 MG/DOSE) 2 MG/3ML ~~LOC~~ SOPN: 0.2500 mL | PEN_INJECTOR | SUBCUTANEOUS | 0 refills | Status: DC

## 2023-02-17 MED ORDER — BUPROPION HCL ER (SR) 150 MG PO TB12
ORAL_TABLET | ORAL | 0 refills | Status: DC
Start: 2023-02-17 — End: 2023-03-10

## 2023-02-17 MED ORDER — METFORMIN HCL 500 MG PO TABS
ORAL_TABLET | ORAL | 0 refills | Status: DC
Start: 2023-02-17 — End: 2023-03-10

## 2023-02-17 NOTE — Progress Notes (Signed)
[name], D.O.  ABFM, ABOM Specializing in Clinical Bariatric Medicine  Office located at: [address] [place], [place]  [address]     Assessment and Plan:   Medications Discontinued During This Encounter  Medication Reason   topiramate (TOPAMAX) 50 MG tablet Reorder   buPROPion (WELLBUTRIN SR) 150 MG 12 hr tablet Reorder   levocetirizine (XYZAL) 5 MG tablet Reorder   metFORMIN (GLUCOPHAGE) 500 MG tablet Reorder   montelukast (SINGULAIR) 10 MG tablet Reorder   Semaglutide,0.25 or 0.5MG /DOS, (OZEMPIC, 0.25 OR 0.5 MG/DOSE,) 2 MG/3ML SOPN Reorder     Meds ordered this encounter  Medications   topiramate (TOPAMAX) 50 MG tablet    Sig: one tab by mouth daily.    Dispense:  30 tablet    Refill:  0   montelukast (SINGULAIR) 10 MG tablet    Sig: Take 1 tablet (10 mg total) by mouth at bedtime.    Dispense:  30 tablet    Refill:  0    30 d supply;  ** OV for RF **   Do not send RF request   metFORMIN (GLUCOPHAGE) 500 MG tablet    Sig: 1 po with lunch and dinner daily    Dispense:  60 tablet    Refill:  0    30 d supply;  ** OV for RF **   Do not send RF request   levocetirizine (XYZAL) 5 MG tablet    Sig: Take 1 tablet (5 mg total) by mouth every evening.    Dispense:  30 tablet    Refill:  0    30 d supply;  ** OV for RF **   Do not send RF request   buPROPion (WELLBUTRIN SR) 150 MG 12 hr tablet    Sig: 1 po q am and 1 po q late afternoon    Dispense:  60 tablet    Refill:  0    Ov for rf   Semaglutide,0.25 or 0.5MG /DOS, (OZEMPIC, 0.25 OR 0.5 MG/DOSE,) 2 MG/3ML SOPN    Sig: Inject 0.25 mLs into the skin once a week. Q thursday    Dispense:  3 mL    Refill:  0     Mood disorder ([name])- Emotional eating Assessment & Plan: No SI/HI. Mood has been stable. No issues related to emotional eating since last OV. Has been compliant and tolerant with Wellbutrin SR 150 mg bid- will refill today, no dose change. Will continue to monitor condition.    Insulin  resistance Assessment & Plan: Lab Results  Component Value Date   HGBA1C 5.4 10/04/2022   HGBA1C 5.5 04/28/2022   HGBA1C 5.2 11/09/2021   INSULIN 22.1 10/04/2022   INSULIN 18.6 04/28/2022   INSULIN 14.8 11/09/2021   No issues with Metformin 500 mg bid-tolerating well, denies any side effects. Will refill today. Hunger and cravings have been better controlled. She did not receive Ozempic from [employer] nor contact them for any additional information. Will try sending Ozempic to different pharmacy today.  I again reminded the patient that the demand for GLP-1-like medications is high and supply is low, therefore the medication can be difficult to obtain.  Strategies to obtain discussed with patient.   Chronic tension-type headache, not intractable Assessment & Plan: Headaches have been stable. Cravings are under good control. She continues Topamax 50 mg daily without any adverse effects - Will refill  today, no dose change. Will continue to monitor condition.    Environmental and seasonal  allergies Assessment & Plan: Symptoms are controlled with Xyzal 5 mg daily and Singulair 10 mg daily- Will refill both meds today.  C/w allergy medications and our low inflammatory meal plan. Will continue monitoring.    BMI 45.0-49.9, adult ([name]) Obesity with current BMI of 46.49 Assessment & Plan: Since last office visit on 01/24/23 patient's muscle mass has decreased by 3.6 lb. Fat mass has increased by 1.4 lb. Total body water has decreased by 1.6 lb.  Counseling done on how various foods will affect these numbers and how to maximize success  Total lbs lost to date: 6 lbs  Total weight loss percentage to date: 2.17  No change to meal plan - see Subjective   Behavioral Intervention Additional resources provided today: patient declined Evidence-based interventions for health behavior change were utilized today including the discussion of self monitoring techniques, problem-solving barriers and SMART  goal setting techniques.   Regarding patient's less desirable eating habits and patterns, we employed the technique of small changes.  Pt will specifically work on: journaling for the next 3 wks & being more active for next visit.    FOLLOW UP: Return in about 3 weeks (around 03/10/2023). She was informed of the importance of frequent follow up visits to maximize her success with intensive lifestyle modifications for her multiple health conditions.  Subjective:   Chief complaint: Obesity Sherone is here to discuss her progress with her obesity treatment plan. She is on the Category 2 Plan and keeping a food journal and adhering to recommended goals of 1500-1600 calories and 110+ protein and states she is following her eating plan approximately 50% of the time. She states she is not exercising.   Interval History:  Keylee Vannorman is here for a follow up office visit. Has not been journaling her intake, but expresses interest in restarting. Her main protein sources are pork chops, chicken, & lunch meat (e.g [place]). Feels that hunger and cravings are well controlled.   Pharmacotherapy for weight loss: She is currently taking  Wellbutrin SR 150 mg bid, Metformin 500 mg bid, & Topamax 50 mg daily  for medical weight loss.  Denies side effects.    Review of Systems:  Pertinent positives were addressed with patient today.  Reviewed by clinician on day of visit: allergies, medications, problem list, medical history, surgical history, family history, social history, and previous encounter notes.  Weight Summary and Biometrics   Weight Lost Since Last Visit: 2lb  Weight Gained Since Last Visit: 0lb   Vitals Temp: 99.2 F (37.3 C) BP: 118/85 Pulse Rate: 92 SpO2: 98 %   Anthropometric Measurements Height: 5\' 4"  (1.626 m) Weight: 271 lb (122.9 kg) BMI (Calculated): 46.49 Weight at Last Visit: 273lb Weight Lost Since Last Visit: 2lb Weight Gained Since Last Visit: 0lb Starting  Weight: 277lb Total Weight Loss (lbs): 6 lb (2.722 kg) Peak Weight: 278lb   Body Composition  Body Fat %: 49.7 % Fat Mass (lbs): 134.8 lbs Muscle Mass (lbs): 129.8 lbs Total Body Water (lbs): 102.8 lbs Visceral Fat Rating : 13   Other Clinical Data Fasting: no Labs: no Today's Visit #: 39 Starting Date: 11/12/20   Objective:   PHYSICAL EXAM: Blood pressure 118/85, pulse 92, temperature 99.2 F (37.3 C), height 5\' 4"  (1.626 m), weight 271 lb (122.9 kg), SpO2 98%. Body mass index is 46.52 kg/m.  General: Well Developed, well nourished, and in no acute distress.  HEENT: Normocephalic, atraumatic Skin: Warm and dry, cap RF less 2 sec, good turgor  Chest:  Normal excursion, shape, no gross abn Respiratory: speaking in full sentences, no conversational dyspnea NeuroM-Sk: Ambulates w/o assistance, moves * 4 Psych: A and O *3, insight good, mood-full  DIAGNOSTIC DATA REVIEWED:  BMET    Component Value Date/Time   NA 140 10/04/2022 0834   K 4.1 10/04/2022 0834   CL 105 10/04/2022 0834   CO2 18 (L) 10/04/2022 0834   GLUCOSE 86 10/04/2022 0834   GLUCOSE 117 (H) 09/21/2010 1808   BUN 12 10/04/2022 0834   CREATININE 0.85 10/04/2022 0834   CALCIUM 8.8 10/04/2022 0834   GFRNONAA NOT CALCULATED 09/21/2010 1808   GFRAA  09/21/2010 1808    NOT CALCULATED        The eGFR has been calculated using the MDRD equation. This calculation has not been validated in all clinical situations. eGFR's persistently <60 mL/min signify possible Chronic Kidney Disease.   Lab Results  Component Value Date   HGBA1C 5.4 10/04/2022   HGBA1C 5.4 11/12/2020   Lab Results  Component Value Date   INSULIN 22.1 10/04/2022   INSULIN 18.4 11/12/2020   Lab Results  Component Value Date   TSH 1.940 04/28/2022   CBC    Component Value Date/Time   WBC 8.4 04/28/2022 0849   WBC 18.1 (H) 09/21/2010 1808   RBC 4.66 04/28/2022 0849   RBC 4.87 09/21/2010 1808   HGB 12.8 04/28/2022 0849    HCT 39.4 04/28/2022 0849   PLT 266 04/28/2022 0849   MCV 85 04/28/2022 0849   MCH 27.5 04/28/2022 0849   MCH 28.5 09/21/2010 1808   MCHC 32.5 04/28/2022 0849   MCHC 35.0 09/21/2010 1808   RDW 13.3 04/28/2022 0849   Iron Studies No results found for: "IRON", "TIBC", "FERRITIN", "IRONPCTSAT" Lipid Panel     Component Value Date/Time   CHOL 175 04/28/2022 0849   TRIG 94 04/28/2022 0849   HDL 59 04/28/2022 0849   CHOLHDL 3.2 06/29/2021 0912   LDLCALC 99 04/28/2022 0849   Hepatic Function Panel     Component Value Date/Time   PROT 6.8 10/04/2022 0834   ALBUMIN 4.0 10/04/2022 0834   AST 14 10/04/2022 0834   ALT 15 10/04/2022 0834   ALKPHOS 83 10/04/2022 0834   BILITOT 0.2 10/04/2022 0834      Component Value Date/Time   TSH 1.940 04/28/2022 0849   Nutritional Lab Results  Component Value Date   VD25OH 67.6 10/04/2022   VD25OH 49.5 04/28/2022   VD25OH 50.9 11/09/2021    Attestations:   I, Special Puri, acting as a Stage manager for [employer], DO., have compiled all relevant documentation for today's office visit on behalf of Thomasene Lot, DO, while in the presence of [employer], DO.  I have reviewed the above documentation for accuracy and completeness, and I agree with the above. [name], D.O.  The 21st Century Cures Act was signed into law in 2016 which includes the topic of electronic health records.  This provides immediate access to information in MyChart.  This includes consultation notes, operative notes, office notes, lab results and pathology reports.  If you have any questions about what you read please let us know at your next visit so we can discuss your concerns and take corrective action if need be.  We are right here with you.

## 2023-02-22 ENCOUNTER — Telehealth (INDEPENDENT_AMBULATORY_CARE_PROVIDER_SITE_OTHER): Payer: Self-pay

## 2023-02-22 ENCOUNTER — Encounter (INDEPENDENT_AMBULATORY_CARE_PROVIDER_SITE_OTHER): Payer: Self-pay

## 2023-02-22 NOTE — Telephone Encounter (Signed)
Prior Auth started for [employer] Key: [id number].  Awaiting insurance determination

## 2023-02-24 NOTE — Telephone Encounter (Signed)
Denied due to not having type 2 diabetes

## 2023-02-28 ENCOUNTER — Telehealth (INDEPENDENT_AMBULATORY_CARE_PROVIDER_SITE_OTHER): Payer: Self-pay | Admitting: Family Medicine

## 2023-02-28 ENCOUNTER — Telehealth: Payer: Self-pay | Admitting: *Deleted

## 2023-02-28 DIAGNOSIS — N946 Dysmenorrhea, unspecified: Secondary | ICD-10-CM

## 2023-02-28 MED ORDER — DROSPIRENONE-ETHINYL ESTRADIOL 3-0.02 MG PO TABS: 1.0000 | ORAL_TABLET | Freq: Every day | ORAL | 3 refills | Status: AC

## 2023-02-28 NOTE — Telephone Encounter (Signed)
Pt mother calling to say that she needs patients birth control changed to fill as a 3 month supply.  She said insurance will no longer cover monthly.  Please send in appropriate amount to pharmacy below.     drospirenone-ethinyl estradiol (YAZ) 3-0.02 MG tablet    [email or web address] - [facility] - [place], [place] - [address]  [address] [place] [address]

## 2023-02-28 NOTE — Telephone Encounter (Signed)
Meds ordered this encounter  Medications   drospirenone-ethinyl estradiol (YAZ) 3-0.02 MG tablet    Sig: Take 1 tablet by mouth daily.    Dispense:  84 tablet    Refill:  3    Order Specific Question:   Supervising Provider    Answer:   Nani Gasser D [[id number]]

## 2023-02-28 NOTE — Telephone Encounter (Signed)
The patient mentioned that she and her daughter have changed their insurance. She stated that her insurance will only cover any medication if it is a 90-day supply. The patient's mother mentioned that she does have documentation to show proof. She would like a call as soon as possible regarding this situation because they will be here on 8/29 and 9/4 and will not be able to pick up her medication.

## 2023-03-01 NOTE — Telephone Encounter (Signed)
Ok to fill 90 days for each of these medications.

## 2023-03-01 NOTE — Telephone Encounter (Signed)
Which medication ?

## 2023-03-02 ENCOUNTER — Encounter (INDEPENDENT_AMBULATORY_CARE_PROVIDER_SITE_OTHER): Payer: Self-pay

## 2023-03-06 ENCOUNTER — Other Ambulatory Visit (INDEPENDENT_AMBULATORY_CARE_PROVIDER_SITE_OTHER): Payer: Self-pay | Admitting: Family Medicine

## 2023-03-06 DIAGNOSIS — E559 Vitamin D deficiency, unspecified: Secondary | ICD-10-CM

## 2023-03-10 ENCOUNTER — Other Ambulatory Visit (INDEPENDENT_AMBULATORY_CARE_PROVIDER_SITE_OTHER): Payer: Self-pay | Admitting: Family Medicine

## 2023-03-10 ENCOUNTER — Encounter (INDEPENDENT_AMBULATORY_CARE_PROVIDER_SITE_OTHER): Payer: Self-pay | Admitting: Family Medicine

## 2023-03-10 ENCOUNTER — Ambulatory Visit (INDEPENDENT_AMBULATORY_CARE_PROVIDER_SITE_OTHER): Payer: Commercial Managed Care - HMO | Admitting: Family Medicine

## 2023-03-10 VITALS — BP 130/85 | HR 92 | Temp 98.2°F | Ht 64.0 in | Wt 273.0 lb

## 2023-03-10 DIAGNOSIS — E66813 Obesity, class 3: Secondary | ICD-10-CM

## 2023-03-10 DIAGNOSIS — E669 Obesity, unspecified: Secondary | ICD-10-CM

## 2023-03-10 DIAGNOSIS — F39 Unspecified mood [affective] disorder: Secondary | ICD-10-CM | POA: Diagnosis not present

## 2023-03-10 DIAGNOSIS — E88819 Insulin resistance, unspecified: Secondary | ICD-10-CM | POA: Diagnosis not present

## 2023-03-10 DIAGNOSIS — J3089 Other allergic rhinitis: Secondary | ICD-10-CM

## 2023-03-10 DIAGNOSIS — Z6841 Body Mass Index (BMI) 40.0 and over, adult: Secondary | ICD-10-CM

## 2023-03-10 DIAGNOSIS — G44229 Chronic tension-type headache, not intractable: Secondary | ICD-10-CM

## 2023-03-10 MED ORDER — MONTELUKAST SODIUM 10 MG PO TABS
10.0000 mg | ORAL_TABLET | Freq: Every day | ORAL | 0 refills | Status: DC
Start: 2023-03-10 — End: 2023-07-13

## 2023-03-10 MED ORDER — BUPROPION HCL ER (SR) 150 MG PO TB12
ORAL_TABLET | ORAL | 0 refills | Status: DC
Start: 2023-03-10 — End: 2023-12-01

## 2023-03-10 MED ORDER — METFORMIN HCL 500 MG PO TABS
ORAL_TABLET | ORAL | 0 refills | Status: DC
Start: 2023-03-10 — End: 2023-12-01
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
## 2023-04-04 ENCOUNTER — Ambulatory Visit (INDEPENDENT_AMBULATORY_CARE_PROVIDER_SITE_OTHER): Payer: Commercial Managed Care - HMO | Admitting: Family Medicine

## 2023-04-14 ENCOUNTER — Ambulatory Visit (INDEPENDENT_AMBULATORY_CARE_PROVIDER_SITE_OTHER): Payer: Commercial Managed Care - HMO | Admitting: Family Medicine

## 2023-04-25 ENCOUNTER — Ambulatory Visit (INDEPENDENT_AMBULATORY_CARE_PROVIDER_SITE_OTHER): Payer: Managed Care, Other (non HMO) | Admitting: Family Medicine

## 2023-04-25 ENCOUNTER — Encounter (INDEPENDENT_AMBULATORY_CARE_PROVIDER_SITE_OTHER): Payer: Self-pay | Admitting: Family Medicine

## 2023-04-25 DIAGNOSIS — E88819 Insulin resistance, unspecified: Secondary | ICD-10-CM

## 2023-04-25 DIAGNOSIS — Z6841 Body Mass Index (BMI) 40.0 and over, adult: Secondary | ICD-10-CM

## 2023-04-25 DIAGNOSIS — G44229 Chronic tension-type headache, not intractable: Secondary | ICD-10-CM

## 2023-04-25 DIAGNOSIS — E559 Vitamin D deficiency, unspecified: Secondary | ICD-10-CM | POA: Diagnosis not present

## 2023-04-25 DIAGNOSIS — J3089 Other allergic rhinitis: Secondary | ICD-10-CM

## 2023-04-25 MED ORDER — TOPIRAMATE 50 MG PO TABS: ORAL_TABLET | ORAL | 0 refills | Status: DC

## 2023-04-25 MED ORDER — LEVOCETIRIZINE DIHYDROCHLORIDE 5 MG PO TABS
5.0000 mg | ORAL_TABLET | Freq: Every evening | ORAL | 0 refills | Status: DC
Start: 2023-04-25 — End: 2023-08-24

## 2023-04-25 NOTE — Progress Notes (Signed)
[name], D.O.  ABFM, ABOM Specializing in Clinical Bariatric Medicine  Office located at: [address] [place], [place]  [address]     Assessment and Plan:   Medications Discontinued During This Encounter  Medication Reason   Semaglutide,0.25 or 0.5MG /DOS, (OZEMPIC, 0.25 OR 0.5 MG/DOSE,) 2 MG/3ML SOPN Cost of medication   topiramate (TOPAMAX) 50 MG tablet Reorder   levocetirizine (XYZAL) 5 MG tablet Reorder    Meds ordered this encounter  Medications   levocetirizine (XYZAL) 5 MG tablet    Sig: Take 1 tablet (5 mg total) by mouth every evening.    Dispense:  90 tablet    Refill:  0    90 d supply;  ** OV for RF **   Do not send RF request   topiramate (TOPAMAX) 50 MG tablet    Sig: one tab by mouth daily.    Dispense:  90 tablet    Refill:  0   Vitamin D deficiency Assessment: Condition is Controlled.. Pt endorses not taking her vitamin D supplement for about 3 weeks in mid September-October. She is now taking her vitamins more regularly without difficulty.  Lab Results  Component Value Date   VD25OH 67.6 10/04/2022   VD25OH 49.5 04/28/2022   VD25OH 50.9 11/09/2021   Plan: - Continue with Ergocalciferol 50K IU twice weekly. She denies need for refill due to having extra from not taking this.   - weight loss will likely improve availability of vitamin D, thus encouraged Molly Cherry to continue with meal plan and their weight loss efforts to further improve this condition.  Thus, we will need to monitor levels regularly (every 3-4 mo on average) to keep levels within normal limits and prevent over supplementation.   Chronic tension-type headache, not intractable Assessment: Condition is Controlled..Pt informed me that since starting Topiramate this has been controlling her headaches very well. She has not had a headache in a while. She denies having any adverse side effects at this time on this medication.   Plan:- Continue Topiramate 50mg  as directed. I will  refill today.   Environmental and seasonal allergies Assessment: Condition is Controlled.. This is being treated with Xyzal once daily in the evenings. Pt endorses that her allergies are well controlled on this medication.   Plan: - Continue Xyzal 5mg  as directed. I will refill today.   Insulin resistance Assessment: Condition is Not optimized.. This is being treated with Metformin. She tolerates this well and is taking her medications more regularly.  Lab Results  Component Value Date   HGBA1C 5.4 10/04/2022   HGBA1C 5.5 04/28/2022   HGBA1C 5.2 11/09/2021   INSULIN 22.1 10/04/2022   INSULIN 18.6 04/28/2022   INSULIN 14.8 11/09/2021    Plan: - Continue Metformin  500 mg bid. She denies need for refill.   - Continue her prudent nutritional plan that is low in simple carbohydrates, saturated fats and trans fats to goal of 5-10% weight loss to achieve significant health benefits.  Pt encouraged to continually advance exercise and cardiovascular fitness as tolerated throughout weight loss journey.   TREATMENT PLAN FOR OBESITY: Morbid obesity ([name])-START BMI 47.55 BMI 45.0-49.9, adult ([name])-current 46.84 Assessment:  Molly Cherry is here to discuss her progress with her obesity treatment plan along with follow-up of her obesity related diagnoses. See Medical Weight Management Flowsheet for complete bioelectrical impedance results.  Condition is not optimized. Biometric data collected today, was reviewed with patient.   Since last office visit on 03/10/2023 patient's  Muscle mass has decreased by 3.2lb. Fat mass has increased by 3.6lb. Total body water has decreased by 6.2lb.  Counseling done on how various foods will affect these numbers and how to maximize success  Total lbs lost to date: 4 Total weight loss percentage to date: 1.44%   Plan: - Restart Category 2 meal plan with b/l options  Behavioral Intervention Additional resources provided today: patient  declined Evidence-based interventions for health behavior change were utilized today including the discussion of self monitoring techniques, problem-solving barriers and SMART goal setting techniques.   Regarding patient's less desirable eating habits and patterns, we employed the technique of small changes.  Pt will specifically work on: Continue to walk and follow the category 2 plan more 5 days a wk for next visit.     She has agreed to Increase the intensity, frequency or duration of aerobic exercises     FOLLOW UP: Return in about 23 days (around 05/18/2023).  She was informed of the importance of frequent follow up visits to maximize her success with intensive lifestyle modifications for her multiple health conditions.  Subjective:   Chief complaint: Obesity Molly Cherry is here to discuss her progress with her obesity treatment plan. She is on the following a lower carbohydrate, vegetable and lean protein rich diet plan and states she is following her eating plan approximately 50% of the time. She states she is walking 30 minutes 2 days per week.  Interval History:  Molly Cherry is here for a follow up office visit.     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 She also informed me that she is going to be moving soon.She has increased her walking frequency. She informed me that the low carb plan did not work good for her as it was not enough options.  She plans on using this extra time at this new facility to walk more in between work.   We reviewed her meal plan and all questions were answered.   Pharmacotherapy for weight loss: She is currently taking Topamax, Wellbutrin, and metformin  for medical weight loss.  Denies side effects.    Review of Systems:  Pertinent positives were addressed with patient today.  Reviewed by clinician on day of visit: allergies, medications, problem list, medical history, surgical history,  family history, social history, and previous encounter notes.  Weight Summary and Biometrics   Weight Lost Since Last Visit: 0lb  Weight Gained Since Last Visit: 0lb   Vitals Temp: 98.7 F (37.1 C) BP: 122/86 Pulse Rate: 90 SpO2: 99 %   Anthropometric Measurements Height: 5\' 4"  (1.626 m) Weight: 273 lb (123.8 kg) BMI (Calculated): 46.84 Weight at Last Visit: 273lb Weight Lost Since Last Visit: 0lb Weight Gained Since Last Visit: 0lb Starting Weight: 277lb Total Weight Loss (lbs): 4 lb (1.814 kg) Peak Weight: 278lb   Body Composition  Body Fat %: 51.2 % Fat Mass (lbs): 140 lbs Muscle Mass (lbs): 126.8 lbs Total Body Water (lbs): 98.8 lbs Visceral Fat Rating : 14   Other Clinical Data Fasting: no Labs: no Today's Visit #: 41 Starting Date: 11/12/20     Objective:   PHYSICAL EXAM: Blood pressure 122/86, pulse 90, temperature 98.7 F (37.1 C), height 5\' 4"  (1.626 m), weight 273 lb (123.8 kg), SpO2 99%. Body mass index is 46.86 kg/m.  General: Well Developed, well nourished, and in no acute distress.  HEENT: Normocephalic, atraumatic Skin: Warm and dry,  cap RF less 2 sec, good turgor Chest:  Normal excursion, shape, no gross abn Respiratory: speaking in full sentences, no conversational dyspnea NeuroM-Sk: Ambulates w/o assistance, moves * 4 Psych: A and O *3, insight good, mood-full  DIAGNOSTIC DATA REVIEWED:  BMET    Component Value Date/Time   NA 140 10/04/2022 0834   K 4.1 10/04/2022 0834   CL 105 10/04/2022 0834   CO2 18 (L) 10/04/2022 0834   GLUCOSE 86 10/04/2022 0834   GLUCOSE 117 (H) 09/21/2010 1808   BUN 12 10/04/2022 0834   CREATININE 0.85 10/04/2022 0834   CALCIUM 8.8 10/04/2022 0834   GFRNONAA NOT CALCULATED 09/21/2010 1808   GFRAA  09/21/2010 1808    NOT CALCULATED        The eGFR has been calculated using the MDRD equation. This calculation has not been validated in all clinical situations. eGFR's persistently <60 mL/min  signify possible Chronic Kidney Disease.   Lab Results  Component Value Date   HGBA1C 5.4 10/04/2022   HGBA1C 5.4 11/12/2020   Lab Results  Component Value Date   INSULIN 22.1 10/04/2022   INSULIN 18.4 11/12/2020   Lab Results  Component Value Date   TSH 1.940 04/28/2022   CBC    Component Value Date/Time   WBC 8.4 04/28/2022 0849   WBC 18.1 (H) 09/21/2010 1808   RBC 4.66 04/28/2022 0849   RBC 4.87 09/21/2010 1808   HGB 12.8 04/28/2022 0849   HCT 39.4 04/28/2022 0849   PLT 266 04/28/2022 0849   MCV 85 04/28/2022 0849   MCH 27.5 04/28/2022 0849   MCH 28.5 09/21/2010 1808   MCHC 32.5 04/28/2022 0849   MCHC 35.0 09/21/2010 1808   RDW 13.3 04/28/2022 0849   Iron Studies No results found for: "IRON", "TIBC", "FERRITIN", "IRONPCTSAT" Lipid Panel     Component Value Date/Time   CHOL 175 04/28/2022 0849   TRIG 94 04/28/2022 0849   HDL 59 04/28/2022 0849   CHOLHDL 3.2 06/29/2021 0912   LDLCALC 99 04/28/2022 0849   Hepatic Function Panel     Component Value Date/Time   PROT 6.8 10/04/2022 0834   ALBUMIN 4.0 10/04/2022 0834   AST 14 10/04/2022 0834   ALT 15 10/04/2022 0834   ALKPHOS 83 10/04/2022 0834   BILITOT 0.2 10/04/2022 0834      Component Value Date/Time   TSH 1.940 04/28/2022 0849   Nutritional Lab Results  Component Value Date   VD25OH 67.6 10/04/2022   VD25OH 49.5 04/28/2022   VD25OH 50.9 11/09/2021    Attestations:   I, Clinical biochemist, acting as a Stage manager for [employer], DO., have compiled all relevant documentation for today's office visit on behalf of Thomasene Lot, DO, while in the presence of [employer], DO.  I have reviewed the above documentation for accuracy and completeness, and I agree with the above. [name], D.O.  The 21st Century Cures Act was signed into law in 2016 which includes the topic of electronic health records.  This provides immediate access to information in MyChart.  This includes  consultation notes, operative notes, office notes, lab results and pathology reports.  If you have any questions about what you read please let us know at your next visit so we can discuss your concerns and take corrective action if need be.  We are right here with you.

## 2023-04-28 ENCOUNTER — Ambulatory Visit: Payer: Self-pay

## 2023-05-02 ENCOUNTER — Ambulatory Visit: Payer: Self-pay

## 2023-05-02 ENCOUNTER — Telehealth: Payer: Self-pay

## 2023-05-02 NOTE — Telephone Encounter (Signed)
Form completed, placed in accordion folder in front office for pickup

## 2023-05-02 NOTE — Telephone Encounter (Signed)
Patient dropped off document  Staff Health Assessment/ Medical Report , to be filled out by provider. Patient requested to send it back via Call Patient to pick up within 3-5 days. Document is located in providers tray at front office.Please advise at Mobile [phone] (mobile)   Pt says that she does really need the form today however I told her the turn around time of 3-5 day.

## 2023-05-18 ENCOUNTER — Encounter (INDEPENDENT_AMBULATORY_CARE_PROVIDER_SITE_OTHER): Payer: Self-pay | Admitting: Family Medicine

## 2023-05-18 ENCOUNTER — Ambulatory Visit (INDEPENDENT_AMBULATORY_CARE_PROVIDER_SITE_OTHER): Payer: Commercial Managed Care - HMO | Admitting: Family Medicine

## 2023-05-18 VITALS — BP 121/86 | HR 79 | Temp 98.0°F | Ht 64.0 in | Wt 270.0 lb

## 2023-05-18 DIAGNOSIS — E88819 Insulin resistance, unspecified: Secondary | ICD-10-CM | POA: Diagnosis not present

## 2023-05-18 DIAGNOSIS — E559 Vitamin D deficiency, unspecified: Secondary | ICD-10-CM | POA: Diagnosis not present

## 2023-05-18 DIAGNOSIS — Z6841 Body Mass Index (BMI) 40.0 and over, adult: Secondary | ICD-10-CM | POA: Diagnosis not present

## 2023-05-18 MED ORDER — VITAMIN D (ERGOCALCIFEROL) 1.25 MG (50000 UNIT) PO CAPS: ORAL_CAPSULE | ORAL | 0 refills | Status: DC

## 2023-05-18 NOTE — Progress Notes (Signed)
Molly Cherry, D.O.  ABFM, ABOM Specializing in Clinical Bariatric Medicine  Office located at: [address] [place], [place]  [address]   Assessment and Plan:  Return for fasting labs next OV FOR THE DISEASE OF OBESITY: BMI 45.0-49.9, adult ([name])-current 46.32 Morbid obesity ([name])-START BMI 47.55 Since last office visit on 04/25/2023 patient's  Muscle mass has increased by 2.2lb. Fat mass has decreased by 5.8lb. Total body water has decreased by 3lb.  Counseling done on how various foods will affect these numbers and how to maximize success  Total lbs lost to date: 7 Total weight loss percentage to date: 2.53%    Recommended Dietary Goals Molly Cherry is currently in the action stage of change. As such, her goal is to continue weight management plan.  She has agreed to: continue current plan  - Discussed with pt that we could obtain labs and fasting IC. Pt declines repeat fasting IC but will return for labs.   - If wanted start on a "changed journal" for her new start in life and refocus on her "why".   - I recommended she read [employer] at [employer]. I extensively discussed with pt on finding ways to start healthy habits with her new life style and recommended the calm app to her.   Behavioral Intervention We discussed the following today: work on meal planning and preparation, work on managing stress, creating time for self-care and relaxation, continue to practice mindfulness when eating, planning for success, continue to work on maintaining a reduced calorie state, getting the recommended amount of protein, incorporating whole foods, making healthy choices, staying well hydrated and practicing mindfulness when eating., and discussed pre-packaged healthier meals such as [employer] All Natural, Whole 30 chicken meals, Longlife meal prep and Factor Meals etc  Additional resources provided today:  recipes and staying on track for the Holidays  Evidence-based  interventions for health behavior change were utilized today including the discussion of self monitoring techniques, problem-solving barriers and SMART goal setting techniques.   Regarding patient's less desirable eating habits and patterns, we employed the technique of small changes.   Pt will specifically work on: Go to her apartment gym 2 days a week for 30 mins and meal prep for next visit.   Recommended Physical Activity Goals Molly Cherry has been advised to work up to 150 minutes of moderate intensity aerobic activity a week and strengthening exercises 2-3 times per week for cardiovascular health, weight loss maintenance and preservation of muscle mass.   She has agreed to :  Think about enjoyable ways to increase daily physical activity and overcoming barriers to exercise and Increase physical activity in their day and reduce sedentary time (increase NEAT).   Pharmacotherapy We discussed various medication options to help Molly Cherry with her weight loss efforts and we both agreed to : continue with nutritional and behavioral strategies and continue current anti-obesity medication regimen   FOR ASSOCIATED CONDITIONS ADDRESSED TODAY: Insulin resistance Assessment & Plan:  Goal is HgbA1c < 5.7, fasting insulin closer to 5.  Patient reports good compliance and tolerance of taking Metformin without difficulty. She denies any GI upset or any other adverse side effects.   Plan:   Continue with Metformin at current dose as directed by prescribing provider. She will continue to focus on protein-rich, low simple carbohydrate foods. We reviewed the importance of hydration, regular exercise for stress reduction, and restorative sleep.    Vitamin D deficiency Assessment:  Pt vitamin D level is within optimal  range since 10/04/2022. Pt has been taking her vitamin supplement consistently this whole November as she was not consistent the previous months.    Plan: Continue with ERGO at current dose as  directed. I discussed the importance of vitamin D to the patient's health and well-being as well as to their ability to lose weight. I informed patient this may be a lifelong thing, and she was encouraged to continue to take the medicine until told otherwise.  Weight loss will continue improve availability of vitamin D and we will monitor levels regularly to keep levels within normal limits and prevent over supplementation. Vitamin D deficiency -     Vitamin D (Ergocalciferol); 1 po q wed and 1 po q sun  Dispense: 24 capsule; Refill: 0   Follow up:   Return in about 2 weeks (around 06/01/2023). She was informed of the importance of frequent follow up visits to maximize her success with intensive lifestyle modifications for her multiple health conditions.  Subjective:   Chief complaint: Obesity Molly Cherry is here to discuss her progress with her obesity treatment plan. She is on the the Category 2 Plan with breakfast and lunch options and states she is following her eating plan approximately 50% of the time. She states she is not exercising.  Interval History:  Molly Cherry is here for a follow up office visit. Since last OV,  she has been well. She informed me that she has been struggling with eating healthy and creative new healthy habits. She informed me that she has been living by herself for the last 2 weeks which has made it easier to buy healthier groceries and keep healthy food in the house. She states that her apartment has a gym and a trail that she will eventually try. Pt is ready to start a new slate with her healthy weight loss journey.   Barriers identified: having difficulty focusing on healthy eating.   Pharmacotherapy for weight loss: She is currently taking Metformin (off label use for incretin effect and / or insulin resistance and / or diabetes prevention) with adequate clinical response  and without side effects..   Review of Systems:  Pertinent positives were addressed  with patient today.  Reviewed by clinician on day of visit: allergies, medications, problem list, medical history, surgical history, family history, social history, and previous encounter notes.  Weight Summary and Biometrics   Weight Lost Since Last Visit: 3lb  Weight Gained Since Last Visit: 0lb    Vitals Temp: 98 F (36.7 C) BP: 121/86 Pulse Rate: 79 SpO2: 96 %   Anthropometric Measurements Height: 5\' 4"  (1.626 m) Weight: 270 lb (122.5 kg) BMI (Calculated): 46.32 Weight at Last Visit: 273lb Weight Lost Since Last Visit: 3lb Weight Gained Since Last Visit: 0lb Starting Weight: 277lb Total Weight Loss (lbs): 7 lb (3.175 kg) Peak Weight: 278lb   Body Composition  Body Fat %: 49.7 % Fat Mass (lbs): 134.2 lbs Muscle Mass (lbs): 129 lbs Total Body Water (lbs): 95.8 lbs Visceral Fat Rating : 13   Other Clinical Data Fasting: no Labs: no Today's Visit #: 42 Starting Date: 11/12/20    Objective:   PHYSICAL EXAM: Blood pressure 121/86, pulse 79, temperature 98 F (36.7 C), height 5\' 4"  (1.626 m), weight 270 lb (122.5 kg), SpO2 96%. Body mass index is 46.35 kg/m.  General: she is overweight, cooperative and in no acute distress. PSYCH: Has normal mood, affect and thought process.   HEENT: EOMI, sclerae are anicteric. Lungs: Normal breathing effort,  no conversational dyspnea. Extremities: Moves * 4 Neurologic: A and O * 3, good insight  DIAGNOSTIC DATA REVIEWED: BMET    Component Value Date/Time   NA 140 10/04/2022 0834   K 4.1 10/04/2022 0834   CL 105 10/04/2022 0834   CO2 18 (L) 10/04/2022 0834   GLUCOSE 86 10/04/2022 0834   GLUCOSE 117 (H) 09/21/2010 1808   BUN 12 10/04/2022 0834   CREATININE 0.85 10/04/2022 0834   CALCIUM 8.8 10/04/2022 0834   GFRNONAA NOT CALCULATED 09/21/2010 1808   GFRAA  09/21/2010 1808    NOT CALCULATED        The eGFR has been calculated using the MDRD equation. This calculation has not been validated in all  clinical situations. eGFR's persistently <60 mL/min signify possible Chronic Kidney Disease.   Lab Results  Component Value Date   HGBA1C 5.4 10/04/2022   HGBA1C 5.4 11/12/2020   Lab Results  Component Value Date   INSULIN 22.1 10/04/2022   INSULIN 18.4 11/12/2020   Lab Results  Component Value Date   TSH 1.940 04/28/2022   CBC    Component Value Date/Time   WBC 8.4 04/28/2022 0849   WBC 18.1 (H) 09/21/2010 1808   RBC 4.66 04/28/2022 0849   RBC 4.87 09/21/2010 1808   HGB 12.8 04/28/2022 0849   HCT 39.4 04/28/2022 0849   PLT 266 04/28/2022 0849   MCV 85 04/28/2022 0849   MCH 27.5 04/28/2022 0849   MCH 28.5 09/21/2010 1808   MCHC 32.5 04/28/2022 0849   MCHC 35.0 09/21/2010 1808   RDW 13.3 04/28/2022 0849   Iron Studies No results found for: "IRON", "TIBC", "FERRITIN", "IRONPCTSAT" Lipid Panel     Component Value Date/Time   CHOL 175 04/28/2022 0849   TRIG 94 04/28/2022 0849   HDL 59 04/28/2022 0849   CHOLHDL 3.2 06/29/2021 0912   LDLCALC 99 04/28/2022 0849   Hepatic Function Panel     Component Value Date/Time   PROT 6.8 10/04/2022 0834   ALBUMIN 4.0 10/04/2022 0834   AST 14 10/04/2022 0834   ALT 15 10/04/2022 0834   ALKPHOS 83 10/04/2022 0834   BILITOT 0.2 10/04/2022 0834      Component Value Date/Time   TSH 1.940 04/28/2022 0849   Nutritional Lab Results  Component Value Date   VD25OH 67.6 10/04/2022   VD25OH 49.5 04/28/2022   VD25OH 50.9 11/09/2021    Attestations:   I, Clinical biochemist, acting as a Stage manager for [employer], DO., have compiled all relevant documentation for today's office visit on behalf of Thomasene Lot, DO, while in the presence of [employer], DO.  Reviewed by clinician on day of visit: allergies, medications, problem list, medical history, surgical history, family history, social history, and previous encounter notes pertinent to patient's obesity diagnosis. I have spent 50 minutes in the care of the  patient today including: preparing to see patient (e.g. review and interpretation of tests, old notes ), obtaining and/or reviewing separately obtained history, performing a medically appropriate examination or evaluation, counseling and educating the patient, ordering medications, test or procedures, documenting clinical information in the electronic or other health care record, and independently interpreting results and communicating results to the patient, family, or caregiver   I have reviewed the above documentation for accuracy and completeness, and I agree with the above. Molly Cherry, D.O.  The 21st Century Cures Act was signed into law in 2016 which includes the topic of electronic health records.  This provides immediate  access to information in MyChart.  This includes consultation notes, operative notes, office notes, lab results and pathology reports.  If you have any questions about what you read please let us know at your next visit so we can discuss your concerns and take corrective action if need be.  We are right here with you.

## 2023-06-02 ENCOUNTER — Ambulatory Visit (INDEPENDENT_AMBULATORY_CARE_PROVIDER_SITE_OTHER): Payer: Commercial Managed Care - HMO | Admitting: Family Medicine

## 2023-06-02 ENCOUNTER — Encounter (INDEPENDENT_AMBULATORY_CARE_PROVIDER_SITE_OTHER): Payer: Self-pay | Admitting: Family Medicine

## 2023-06-02 VITALS — BP 118/82 | HR 99 | Temp 97.6°F | Ht 64.0 in | Wt 271.0 lb

## 2023-06-02 DIAGNOSIS — E7849 Other hyperlipidemia: Secondary | ICD-10-CM | POA: Diagnosis not present

## 2023-06-02 DIAGNOSIS — E559 Vitamin D deficiency, unspecified: Secondary | ICD-10-CM | POA: Diagnosis not present

## 2023-06-02 DIAGNOSIS — Z6841 Body Mass Index (BMI) 40.0 and over, adult: Secondary | ICD-10-CM

## 2023-06-02 DIAGNOSIS — E88819 Insulin resistance, unspecified: Secondary | ICD-10-CM | POA: Diagnosis not present

## 2023-06-02 DIAGNOSIS — E538 Deficiency of other specified B group vitamins: Secondary | ICD-10-CM

## 2023-06-02 NOTE — Progress Notes (Incomplete)
[name], D.O.  ABFM, ABOM Specializing in Clinical Bariatric Medicine  Office located at: [address] [place], [place]  [address]   Assessment and Plan:   FOR THE DISEASE OF OBESITY: BMI 45.0-49.9, adult ([name])-current 46.32 Morbid obesity ([name])-START BMI 47.55 Assessment & Plan: Since last office visit on 05/18/23 patient's  muscle mass has decreased by 3.4 lb. Fat mass has increased by 5 lb. Total body water has decreased by 0.8 lb.  Counseling done on how various foods will affect these numbers and how to maximize success  Total lbs lost to date: 6 lbs  Total weight loss percentage to date: 2.17%    Recommended Dietary Goals Molly Cherry is currently in the action stage of change. As such, her goal is to continue weight management plan.  She has agreed to: continue current plan   Behavioral Intervention We discussed the following today: continue to work on maintaining a reduced calorie state, getting the recommended amount of protein, incorporating whole foods, making healthy choices, staying well hydrated and practicing mindfulness when eating.  Additional resources provided today: None  Evidence-based interventions for health behavior change were utilized today including the discussion of self monitoring techniques, problem-solving barriers and SMART goal setting techniques.   Regarding patient's less desirable eating habits and patterns, we employed the technique of small changes.   Pt will specifically work on: n/a   Recommended Physical Activity Goals Molly Cherry has been advised to work up to 150 minutes of moderate intensity aerobic activity a week and strengthening exercises 2-3 times per week for cardiovascular health, weight loss maintenance and preservation of muscle mass.   She has agreed to : walking 30-45 minutes, 4 days a week and or YouTube exercise videos.    Pharmacotherapy We both agreed to : continue current anti-obesity medication  regimen   FOR ASSOCIATED CONDITIONS ADDRESSED TODAY:  Insulin resistance Assessment & Plan: Most recent Hemoglobin A1c and fasting insulin: Lab Results  Component Value Date   HGBA1C 5.4 10/04/2022   HGBA1C 5.5 04/28/2022   HGBA1C 5.2 11/09/2021   INSULIN 22.1 10/04/2022   INSULIN 18.6 04/28/2022   INSULIN 14.8 11/09/2021    She endorses taking Metformin 500 mg bid without any GI side effects. Her hunger and cravings are controlled when following her prudent nutritional plan. Will recheck labs today. Pt will continue with weight loss therapy and Metformin at current dose.   Orders: -     Hemoglobin A1c -     Insulin, random -     TSH   Other hyperlipidemia Assessment & Plan: Most recent Lipid Panel:  Lab Results  Component Value Date   CHOL 175 04/28/2022   HDL 59 04/28/2022   LDLCALC 99 04/28/2022   TRIG 94 04/28/2022   CHOLHDL 3.2 06/29/2021   No current meds. Diet/exercise approach. Will recheck labs as part of routine screening. She will continue our treatment plan of a heart-heathy, low cholesterol meal plan. Pt will increase exercise.   Orders: -     Lipid panel -     Comprehensive metabolic panel   Vitamin D deficiency Assessment & Plan: Mot recent Vitamin D:  Lab Results  Component Value Date   VD25OH 67.6 10/04/2022   VD25OH 49.5 04/28/2022   VD25OH 50.9 11/09/2021   She is currently on ERGO 50,000 units twice weekly without any adverse SE. She will continue weight loss efforts and ERGO at current dose.   Orders: -     VITAMIN D 25  Hydroxy (Vit-D Deficiency, Fractures)   B12 deficiency Assessment & Plan: Most recent B12:  Lab Results  Component Value Date   VITAMINB12 839 10/04/2022   She is currently on OTC Cyanocobalamin 500 mcg daily. She will continue with current supplementation regiment and we will recheck labs.   Orders: -     Vitamin B12   Follow up:   Return 06/20/23. She was informed of the importance of frequent follow up  visits to maximize her success with intensive lifestyle modifications for her multiple health conditions.  Molly Cherry is aware that we will review all of her lab results at our next visit.  She is aware that if anything is critical/ life threatening with the results, we will be contacting her via MyChart prior to the office visit to discuss management.    Subjective:   Chief complaint: Obesity Molly Cherry is here to discuss her progress with her obesity treatment plan. She is on the Category 2 Plan and states she is following her eating plan approximately 50% of the time. She states she is walking 30 minutes 3 days per week.  Interval History:  Molly Cherry is here for a follow up office visit. Since last OV, Molly Cherry is up 1 lb. 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 She was not able to work on meal planning/prepping. She has been walking her dog more. She is eating 2-3 meals a day. On days she has two meals, she skips breakfast. Her hunger and cravings are stable.   Pharmacotherapy for weight loss: She is currently taking  Wellbutrin SR 150 mg bid, Metformin 500 mg bid, and Topamax 50 mg once daily. .   Review of Systems:  Pertinent positives were addressed with patient today.  Reviewed by clinician on day of visit: allergies, medications, problem list, medical history, surgical history, family history, social history, and previous encounter notes.  Weight Summary and Biometrics   Weight Lost Since Last Visit: 0  Weight Gained Since Last Visit: 1 lb   Vitals Temp: 97.6 F (36.4 C) BP: 118/82 Pulse Rate: 99 SpO2: 98 %   Anthropometric Measurements Height: 5\' 4"  (1.626 m) Weight: 271 lb (122.9 kg) BMI (Calculated): 46.49 Weight at Last Visit: 270 lb Weight Lost Since Last Visit: 0 Weight Gained Since Last Visit: 1 lb Starting Weight: 277 lb Total Weight Loss (lbs): 6 lb (2.722 kg) Peak Weight: 278  lb   Body Composition  Body Fat %: 51.3 % Fat Mass (lbs): 139.2 lbs Muscle Mass (lbs): 125.6 lbs Total Body Water (lbs): 95 lbs Visceral Fat Rating : 14   Other Clinical Data Fasting: Yes Labs: Yes Today's Visit #: 43 Starting Date: 11/12/20   Objective:   PHYSICAL EXAM: Blood pressure 118/82, pulse 99, temperature 97.6 F (36.4 C), height 5\' 4"  (1.626 m), weight 271 lb (122.9 kg), SpO2 98%. Body mass index is 46.52 kg/m.  General: she is overweight, cooperative and in no acute distress. PSYCH: Has normal mood, affect and thought process.   HEENT: EOMI, sclerae are anicteric. Lungs: Normal breathing effort, no conversational dyspnea. Extremities: Moves * 4 Neurologic: A and O * 3, good insight  DIAGNOSTIC DATA REVIEWED: BMET    Component Value Date/Time   NA 140 10/04/2022 0834   K 4.1 10/04/2022 0834   CL 105 10/04/2022 0834   CO2 18 (L) 10/04/2022 0834   GLUCOSE 86 10/04/2022 0834   GLUCOSE 117 (H) 09/21/2010 1808  BUN 12 10/04/2022 0834   CREATININE 0.85 10/04/2022 0834   CALCIUM 8.8 10/04/2022 0834   GFRNONAA NOT CALCULATED 09/21/2010 1808   GFRAA  09/21/2010 1808    NOT CALCULATED        The eGFR has been calculated using the MDRD equation. This calculation has not been validated in all clinical situations. eGFR's persistently <60 mL/min signify possible Chronic Kidney Disease.   Lab Results  Component Value Date   HGBA1C 5.4 10/04/2022   HGBA1C 5.4 11/12/2020   Lab Results  Component Value Date   INSULIN 22.1 10/04/2022   INSULIN 18.4 11/12/2020   Lab Results  Component Value Date   TSH 1.940 04/28/2022   CBC    Component Value Date/Time   WBC 8.4 04/28/2022 0849   WBC 18.1 (H) 09/21/2010 1808   RBC 4.66 04/28/2022 0849   RBC 4.87 09/21/2010 1808   HGB 12.8 04/28/2022 0849   HCT 39.4 04/28/2022 0849   PLT 266 04/28/2022 0849   MCV 85 04/28/2022 0849   MCH 27.5 04/28/2022 0849   MCH 28.5 09/21/2010 1808   MCHC 32.5 04/28/2022  0849   MCHC 35.0 09/21/2010 1808   RDW 13.3 04/28/2022 0849   Iron Studies No results found for: "IRON", "TIBC", "FERRITIN", "IRONPCTSAT" Lipid Panel     Component Value Date/Time   CHOL 175 04/28/2022 0849   TRIG 94 04/28/2022 0849   HDL 59 04/28/2022 0849   CHOLHDL 3.2 06/29/2021 0912   LDLCALC 99 04/28/2022 0849   Hepatic Function Panel     Component Value Date/Time   PROT 6.8 10/04/2022 0834   ALBUMIN 4.0 10/04/2022 0834   AST 14 10/04/2022 0834   ALT 15 10/04/2022 0834   ALKPHOS 83 10/04/2022 0834   BILITOT 0.2 10/04/2022 0834      Component Value Date/Time   TSH 1.940 04/28/2022 0849   Nutritional Lab Results  Component Value Date   VD25OH 67.6 10/04/2022   VD25OH 49.5 04/28/2022   VD25OH 50.9 11/09/2021    Attestations:   I, Special Puri, acting as a Stage manager for [employer], DO., have compiled all relevant documentation for today's office visit on behalf of Thomasene Lot, DO, while in the presence of [employer], DO.  I have reviewed the above documentation for accuracy and completeness, and I agree with the above. [name], D.O.  The 21st Century Cures Act was signed into law in 2016 which includes the topic of electronic health records.  This provides immediate access to information in MyChart.  This includes consultation notes, operative notes, office notes, lab results and pathology reports.  If you have any questions about what you read please let us know at your next visit so we can discuss your concerns and take corrective action if need be.  We are right here with you.

## 2023-06-03 LAB — HEMOGLOBIN A1C
Est. average glucose Bld gHb Est-mCnc: 117 mg/dL
Hgb A1c MFr Bld: 5.7 % — ABNORMAL HIGH (ref 4.8–5.6)

## 2023-06-03 LAB — INSULIN, RANDOM: INSULIN: 26.3 u[IU]/mL — ABNORMAL HIGH (ref 2.6–24.9)

## 2023-06-03 LAB — LIPID PANEL
Chol/HDL Ratio: 2.8 {ratio} (ref 0.0–4.4)
Cholesterol, Total: 195 mg/dL (ref 100–199)
HDL: 69 mg/dL (ref >39)
LDL Chol Calc (NIH): 93 mg/dL (ref 0–99)
Triglycerides: 199 mg/dL — ABNORMAL HIGH (ref 0–149)
VLDL Cholesterol Cal: 33 mg/dL (ref 5–40)

## 2023-06-03 LAB — COMPREHENSIVE METABOLIC PANEL
ALT: 18 [IU]/L (ref 0–32)
AST: 18 [IU]/L (ref 0–40)
Albumin: 4 g/dL (ref 4.0–5.0)
Alkaline Phosphatase: 97 [IU]/L (ref 44–121)
BUN/Creatinine Ratio: 13 (ref 9–23)
BUN: 11 mg/dL (ref 6–20)
Bilirubin Total: <0.2 mg/dL (ref 0.0–1.2)
CO2: 20 mmol/L (ref 20–29)
Calcium: 8.9 mg/dL (ref 8.7–10.2)
Chloride: 106 mmol/L (ref 96–106)
Creatinine, Ser: 0.84 mg/dL (ref 0.57–1.00)
Globulin, Total: 2.9 g/dL (ref 1.5–4.5)
Glucose: 87 mg/dL (ref 70–99)
Potassium: 4.6 mmol/L (ref 3.5–5.2)
Sodium: 139 mmol/L (ref 134–144)
Total Protein: 6.9 g/dL (ref 6.0–8.5)
eGFR: 101 mL/min/{1.73_m2} (ref >59)

## 2023-06-03 LAB — TSH: TSH: 3.16 u[IU]/mL (ref 0.450–4.500)

## 2023-06-03 LAB — VITAMIN D 25 HYDROXY (VIT D DEFICIENCY, FRACTURES): Vit D, 25-Hydroxy: 56.7 ng/mL (ref 30.0–100.0)

## 2023-06-03 LAB — VITAMIN B12: Vitamin B-12: 624 pg/mL (ref 232–1245)

## 2023-06-19 NOTE — Progress Notes (Unsigned)
SUBJECTIVE: Discussed the use of AI scribe software for clinical note transcription with the patient, who gave verbal consent to proceed.  History of Present Illness     Chief Complaint: Obesity  Interim History: She is up 5 lbs since her last visit.  Bio impedence scale reviewed with the patient:  Muscle mass + 1.4 lbs Adipose mass + 3 lbs Total body water + 2.6 lbs.   Molly Cherry, a 22 year old individual with a history of obesity, insulin resistance, hyperlipidemia, and vitamin D and B12 deficiencies, presents for a follow-up visit regarding her obesity treatment plan. 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 These changes have also impacted her dietary habits, with a shift towards more snack-based and readily available foods due to financial constraints.  She acknowledges a period of non-adherence to her prescribed medications, including metformin, Topamax, Wellbutrin, and vitamin supplements, due to the stress and distractions of moving. However, she has since resumed her medication regimen. She also reports a lapse in her practice of journaling her food intake, which she identifies as a successful strategy for weight loss in the past.  Laboratory results indicate slightly elevated triglycerides, which she attributes to recent dietary changes. Her A1c level has also increased slightly, and her insulin level is up, both likely reflective of her recent dietary changes and weight gain. Her vitamin D level has decreased slightly, likely due to a period of non-adherence to her vitamin D supplement. Her TSH level has increased slightly, which may be a response to recent stress.  Despite these challenges, she expresses a positive outlook and a commitment to returning to healthier habits. She plans to resume journaling her food intake and expresses  interest in incorporating more physical activity into her routine, including walking her dog and utilizing the gym at her apartment complex. She also plans to return to her prescribed diet once her financial situation stabilizes with the receipt of her first paycheck from her new job.  Molly Cherry is here to discuss her progress with her obesity treatment plan. She is on the Category 2 Plan and states she is following her eating plan approximately 25 % of the time. She states she is exercising walking 20 minutes 2 times per week.   OBJECTIVE: Visit Diagnoses: Problem List Items Addressed This Visit     Insulin resistance - Primary   Other hyperlipidemia   Vitamin D deficiency   Mood disorder ([name]), with emotional eating   B12 deficiency   Other Visit Diagnoses       Morbid obesity ([name])-START BMI 47.55         BMI 45.0-49.9, adult ([name]) Current BMI 47.4         Obesity 22 year old with obesity, experiencing challenges adhering to dietary plan due to job change and financial constraints. Muscle mass increased from 125.6 to 127 lbs, adipose tissue also increased. Blood pressure slightly elevated, likely due to rushing to the appointment. Discussed dietary adherence, physical activity, and provided informed consent regarding dietary plan risks (weight gain, increased adipose tissue), benefits (weight loss, improved metabolic parameters), and alternatives (different dietary plans). - Provide category two dietary plan and grocery list - Encourage journaling of food intake using Molly Cherry or a notebook - Recommend 1200-1400 kcal/day, 85+g protein/day, and 25g fiber/day - Encourage walking and physical activity, including using the apartment gym - Discuss potential breakfast options [name], Molly Cherry, and oatmeal without added sugars.   Insulin Resistance A1c elevated at 5.7, insulin level high at 26.3, likely due to increased carbohydrate intake and both worsening.  Discussed weight loss and dietary adherence in managing insulin resistance. Provided informed consent regarding metformin, including risks (gastrointestinal upset), benefits (lowering A1c), and potential need for dosage adjustment. - Continue metformin 500 mg BID Continue working on nutrition plan to decrease simple carbohydrates, increase lean proteins and exercise to promote weight loss, improve glycemic control and prevent progression to Type 2 diabetes.  - Consider increasing metformin dosage if A1c remains elevated - Emphasize weight loss to help reduce A1c  Hyperlipidemia Triglycerides elevated at 199, likely due to recent dietary changes. HDL high at 69, LDL improved at 93. Discussed reducing simple carbohydrates to manage lipid levels. Provided informed consent regarding dietary changes, including risks (elevated triglycerides), benefits (improved lipid profile), and alternatives (medication if dietary changes are insufficient). - Encourage reduction of simple carbohydrates - Continue monitoring lipid levels Continue to work on nutrition plan -decreasing simple carbohydrates, increasing lean proteins, decreasing saturated fats and cholesterol , avoiding trans fats and exercise as able to promote weight loss, improve lipids and decrease cardiovascular risks.  Vitamin D Deficiency Vitamin D level- 56.7, but there was a period of non-compliance with medication. Discussed consistent supplementation. Provided informed consent regarding vitamin D supplementation, including risks (deficiency-related complications), benefits (improved bone health), and alternatives (different dosing schedules). - Continue vitamin D supplementation twice a week- Ergocalciferol 50,000 units twice weekly.  Low vitamin D levels can be associated with adiposity and may result in leptin resistance and weight gain. Also associated with fatigue. Currently on vitamin D supplementation without any adverse effects.   Vitamin  B12 Deficiency Vitamin B12 level is 624, within the desired range. Patient compliant with daily B12 supplementation. Discussed continued supplementation. Provided informed consent regarding B12 supplementation, including risks (deficiency-related complications), benefits (improved energy levels), and alternatives (different forms of B12). - Continue daily B12 500 mcg supplementation  General Health Maintenance Managing stress through walking her dog and has a consistent work schedule. Supportive environment and plans to use the apartment gym. Discussed stress reduction and consistent physical activity. Provided informed consent regarding stress management techniques, including risks (increased stress-related health issues), benefits (improved mental and physical health), and alternatives (different stress reduction techniques). - Encourage stress reduction techniques - Promote consistent physical activity - Discuss meal planning and preparation to maintain dietary adherence  Follow-up - Maintain follow-up appointment with Dr. Sharee Holster on July 13, 2023 - Schedule follow-up appointment in February.  Vitals Temp: 97.8 F (36.6 C) BP: 130/82 Pulse Rate: 87 SpO2: 97 %   Anthropometric Measurements Height: 5\' 4"  (1.626 m) Weight: 276 lb (125.2 kg) BMI (Calculated): 47.35 Weight at Last Visit: 271 lb Weight Lost Since Last Visit: 0 Weight Gained Since Last Visit: 5 lb Starting Weight: 277 lb Total Weight Loss (lbs): 1 lb (0.454 kg) Peak Weight: 278 lb   Body Composition  Body Fat %: 51.5 % Fat Mass (lbs): 142.2 lbs Muscle Mass (lbs): 127 lbs Total Body Water (lbs): 97.6 lbs Visceral Fat Rating : 14   Other Clinical Data Fasting: yes Labs: no Today's Visit #: 44 Starting Date: 11/12/20     ASSESSMENT AND PLAN:  Diet: Molly Cherry is currently in the action stage of change. As such, her goal is to get back to weight loss efforts. She has agreed to Category 2 Plan and  keeping a food journal and adhering to  recommended goals of 1200-1400 calories and 85+ grams of protein.  Exercise: Molly Cherry has been instructed to work up to a goal of 150 minutes of combined cardio and strengthening exercise per week for weight loss and overall health benefits.   Behavior Modification:  We discussed the following Behavioral Modification Strategies today: increasing lean protein intake, decreasing simple carbohydrates, increasing vegetables, increase H2O intake, increase high fiber foods, no skipping Cherry, meal planning and cooking strategies, keeping healthy foods in the home, planning for success, and keep a strict food journal. We discussed various medication options to help Molly Cherry with her weight loss efforts and we both agreed to continue metformin for primary indication of insulin resistance and Wellbutrin and topamax for emotional eating/cravings.  Return in about 3 weeks (around 07/11/2023).Marland Kitchen She was informed of the importance of frequent follow up visits to maximize her success with intensive lifestyle modifications for her multiple health conditions.  Attestation Statements:   Reviewed by clinician on day of visit: allergies, medications, problem list, medical history, surgical history, family history, social history, and previous encounter notes.   Time spent on visit including pre-visit chart review and post-visit care and charting was 33 minutes.    Molly Like, PA-C

## 2023-06-20 ENCOUNTER — Ambulatory Visit (INDEPENDENT_AMBULATORY_CARE_PROVIDER_SITE_OTHER): Payer: Commercial Managed Care - HMO | Admitting: Physician Assistant

## 2023-06-20 ENCOUNTER — Encounter (INDEPENDENT_AMBULATORY_CARE_PROVIDER_SITE_OTHER): Payer: Self-pay | Admitting: Physician Assistant

## 2023-06-20 VITALS — BP 130/82 | HR 87 | Temp 97.8°F | Ht 64.0 in | Wt 276.0 lb

## 2023-06-20 DIAGNOSIS — Z6841 Body Mass Index (BMI) 40.0 and over, adult: Secondary | ICD-10-CM

## 2023-06-20 DIAGNOSIS — F39 Unspecified mood [affective] disorder: Secondary | ICD-10-CM

## 2023-06-20 DIAGNOSIS — E88819 Insulin resistance, unspecified: Secondary | ICD-10-CM

## 2023-06-20 DIAGNOSIS — E538 Deficiency of other specified B group vitamins: Secondary | ICD-10-CM

## 2023-06-20 DIAGNOSIS — E559 Vitamin D deficiency, unspecified: Secondary | ICD-10-CM

## 2023-06-20 DIAGNOSIS — E7849 Other hyperlipidemia: Secondary | ICD-10-CM

## 2023-07-09 ENCOUNTER — Other Ambulatory Visit (INDEPENDENT_AMBULATORY_CARE_PROVIDER_SITE_OTHER): Payer: Self-pay | Admitting: Family Medicine

## 2023-07-09 DIAGNOSIS — G44229 Chronic tension-type headache, not intractable: Secondary | ICD-10-CM

## 2023-07-09 DIAGNOSIS — J3089 Other allergic rhinitis: Secondary | ICD-10-CM

## 2023-07-13 ENCOUNTER — Ambulatory Visit (INDEPENDENT_AMBULATORY_CARE_PROVIDER_SITE_OTHER): Payer: Commercial Managed Care - HMO | Admitting: Physician Assistant

## 2023-07-13 ENCOUNTER — Ambulatory Visit (INDEPENDENT_AMBULATORY_CARE_PROVIDER_SITE_OTHER): Payer: Commercial Managed Care - HMO | Admitting: Family Medicine

## 2023-07-13 ENCOUNTER — Encounter (INDEPENDENT_AMBULATORY_CARE_PROVIDER_SITE_OTHER): Payer: Self-pay | Admitting: Family Medicine

## 2023-07-13 VITALS — BP 126/84 | HR 84 | Temp 98.2°F | Ht 64.0 in | Wt 271.0 lb

## 2023-07-13 DIAGNOSIS — R7303 Prediabetes: Secondary | ICD-10-CM

## 2023-07-13 DIAGNOSIS — F5089 Other specified eating disorder: Secondary | ICD-10-CM

## 2023-07-13 DIAGNOSIS — E669 Obesity, unspecified: Secondary | ICD-10-CM

## 2023-07-13 DIAGNOSIS — Z6841 Body Mass Index (BMI) 40.0 and over, adult: Secondary | ICD-10-CM

## 2023-07-13 DIAGNOSIS — E559 Vitamin D deficiency, unspecified: Secondary | ICD-10-CM

## 2023-07-13 DIAGNOSIS — F39 Unspecified mood [affective] disorder: Secondary | ICD-10-CM

## 2023-07-13 DIAGNOSIS — E66813 Obesity, class 3: Secondary | ICD-10-CM

## 2023-07-13 DIAGNOSIS — J3089 Other allergic rhinitis: Secondary | ICD-10-CM

## 2023-07-13 MED ORDER — MONTELUKAST SODIUM 10 MG PO TABS: 10.0000 mg | ORAL_TABLET | Freq: Every day | ORAL | 0 refills | Status: DC

## 2023-07-13 NOTE — Progress Notes (Signed)
Molly Cherry, D.O.  ABFM, ABOM Specializing in Clinical Bariatric Medicine  Office located at: [address] [place], [place]  [address]   Assessment and Plan:   FOR THE DISEASE OF OBESITY: Obesity with current BMI of 46.84 BMI 45.0-49.9, adult ([name]) Current BMI 46.49 Assessment & Plan: Since last office visit with Molly Arms, PA on 06/20/23 patient's muscle mass has increased by 1.2lb. Fat mass has decreased by 5.6lb. Total body water has decreased by 3lb.  Counseling done on how various foods will affect these numbers and how to maximize success  Total lbs lost to date: 6 Total weight loss percentage to date: -2.17%   Recommended Dietary Goals Molly Cherry is currently in the action stage of change. As such, her goal is to continue weight management plan.  She has agreed to: continue current plan of Category 2 and journaling 1200-1400 calories and 85+ g of protein.  Encouraged pt to have 30 g protein for breakfast, 30-40 g protein for lunch, and 40-50 g protein for dinner. Pt will focus on eating all natural, reduced calorie, non-processed healthy foods.    Behavioral Intervention We discussed the following today: increasing lean protein intake to established goals, decreasing simple carbohydrates , increasing vegetables, increasing lower glycemic fruits, increasing water intake , keeping healthy foods at home, decreasing eating out or consumption of processed foods, and making healthy choices when eating convenient foods, avoiding temptations and identifying enticing environmental cues, continue to work on maintaining a reduced calorie state, getting the recommended amount of protein, incorporating whole foods, making healthy choices, staying well hydrated and practicing mindfulness when eating., and using GPT or another AI platform for recipe ideas- searching "low calorie, low carb, high protein chicken recipes" etc  Additional resources provided today: Handout on healthy  eating and balanced plate, Handout on complex carbohydrates and lean sources of protein, and Chat GPT recipes  Evidence-based interventions for health behavior change were utilized today including the discussion of self monitoring techniques, problem-solving barriers and SMART goal setting techniques.   Regarding patient's less desirable eating habits and patterns, we employed the technique of small changes.   Pt will specifically work on: Increase exercise on the elliptical to 3 days a week  for next visit.    Recommended Physical Activity Goals Molly Cherry has been advised to work up to 150 minutes of moderate intensity aerobic activity a week and strengthening exercises 2-3 times per week for cardiovascular health, weight loss maintenance and preservation of muscle mass.   She has agreed to :  Increase physical activity in their day and reduce sedentary time (increase NEAT)., Increase the intensity, frequency or duration of aerobic exercises  , and Going to the gym (elliptical) at least 3 days a week.   Pharmacotherapy We discussed various medication options to help Molly Cherry with her weight loss efforts and we both agreed to : continue with nutritional and behavioral strategies and continue current anti-obesity medication regimen   FOR ASSOCIATED CONDITIONS ADDRESSED TODAY: Prediabetes Assessment & Plan: Lab Results  Component Value Date   HGBA1C 5.7 (H) 06/02/2023   HGBA1C 5.4 10/04/2022   HGBA1C 5.5 04/28/2022   INSULIN 26.3 (H) 06/02/2023   INSULIN 22.1 10/04/2022   INSULIN 18.6 04/28/2022   Pt previously had insulin resistance and now meets the diagnostic criteria for prediabetes with her last A1C being 5.7 as of 06/02/23. She is currently compliant with Metformin 500 mg BID, however she reports a prior 2 week period that she was not  taking it. Pt reports no intolerances or adverse side effects. Exercising twice a week on the elliptical with notable improvements in stamina and  endurance.   Encouraged Molly Cherry to continue working on maintaining a reduced calorie and healthy diet as well as increasing her exercise routine to 3 days per week. Reviewed multiple handouts/resources with pt on healthy foods that are on-plan. Continue with her current regimen on Metformin. Refill not sent today due to pt having enough Metformin to last until her next visit.    Mood disorder ([name])- Emotional eating Assessment & Plan: Moods are stable currently. Symptoms of emotional eating also stable. Denies any SI/HI. Pt is adhering well to Wellbutrin BID, reports no intolerances or side effects. Pt has had great family support and is highly motivated since getting back on track with her meal plan 2 weeks ago. She is working on following her meal plan, exercising, and limiting off-plan eating.   Encouraged pt to increase exercise to 3 days per week and reviewed the benefits this may have for her moods. Continue with current regimen as directed. No medication changes made today.    Vitamin D deficiency Assessment & Plan: Lab Results  Component Value Date   VD25OH 56.7 06/02/2023   VD25OH 67.6 10/04/2022   VD25OH 49.5 04/28/2022   Vitamin D was at goal at 56.7 on 06/02/23. She is on ERGO 50K units twice weekly. Tolerating well, no side effects. No acute concerns today.  No changes made today. Continue with her vitamin D supplementation as directed.   Environmental and seasonal allergies Assessment & Plan: Pt has had good control of allergies with medications. She is currently on Singulair 10 mg once daily at nighttime. Tolerating well with no side effects reported. No concerns in this regard today. Continue with current regimen unless directed otherwise.   Orders: - Refill Singulair today, no changes made.    Follow up:   Return in about 19 days (around 08/01/2023). She was informed of the importance of frequent follow up visits to maximize her success with intensive lifestyle  modifications for her multiple health conditions.  Subjective:   Chief complaint: Obesity Molly Cherry is here to discuss her progress with her obesity treatment plan. She is on the the Category 2 Plan and keeping a food journal and adhering to recommended goals of 1200-1400 calories and 85+ protein and states she is following her eating plan approximately 50% of the time. She states she is going to the gym (elliptical) 30 minutes 2 days per week.  Interval History:  Molly Cherry is here for a follow up office visit. Since last OV, she is down 5 lbs. She struggled to stay on plan over the holidays. Has been back on track in the last 2 weeks and is highly motivated. Has great support, was working on exercise and getting on plan foods with her mom.   Barriers identified: strong hunger signals and/or impaired satiety / inhibitory control, having difficulty focusing on healthy eating, and emotional eating.   Pharmacotherapy for weight loss: She is currently taking Metformin (off label use for incretin effect and / or insulin resistance and / or diabetes prevention) with adequate clinical response  and without side effects. and Bupropion (single agent, off label use) with adequate clinical response  and without side effects..   Review of Systems:  Pertinent positives were addressed with patient today.  Reviewed by clinician on day of visit: allergies, medications, problem list, medical history, surgical history, family history, social history, and previous  encounter notes.  Weight Summary and Biometrics   Weight Lost Since Last Visit: 5 lb  Weight Gained Since Last Visit: 0   Vitals Temp: 98.2 F (36.8 C) BP: 126/84 Pulse Rate: 84 SpO2: 98 %   Anthropometric Measurements Height: 5\' 4"  (1.626 m) Weight: 271 lb (122.9 kg) BMI (Calculated): 46.49 Weight at Last Visit: 276 lb Weight Lost Since Last Visit: 5 lb Weight Gained Since Last Visit: 0 Starting Weight: 277 lb Total Weight  Loss (lbs): 6 lb (2.722 kg) Peak Weight: 278 lb   Body Composition  Body Fat %: 50.3 % Fat Mass (lbs): 136.6 lbs Muscle Mass (lbs): 128.2 lbs Total Body Water (lbs): 94.6 lbs Visceral Fat Rating : 14   Other Clinical Data Fasting: yes Labs: no Today's Visit #: 45 Starting Date: 11/12/20    Objective:   PHYSICAL EXAM: Blood pressure 126/84, pulse 84, temperature 98.2 F (36.8 C), height 5\' 4"  (1.626 m), weight 271 lb (122.9 kg), last menstrual period 05/19/2023, SpO2 98%. Body mass index is 46.52 kg/m.  General: she is overweight, cooperative and in no acute distress. PSYCH: Has normal mood, affect and thought process.   HEENT: EOMI, sclerae are anicteric. Lungs: Normal breathing effort, no conversational dyspnea. Extremities: Moves * 4 Neurologic: A and O * 3, good insight  DIAGNOSTIC DATA REVIEWED: BMET    Component Value Date/Time   NA 139 06/02/2023 1445   K 4.6 06/02/2023 1445   CL 106 06/02/2023 1445   CO2 20 06/02/2023 1445   GLUCOSE 87 06/02/2023 1445   GLUCOSE 117 (H) 09/21/2010 1808   BUN 11 06/02/2023 1445   CREATININE 0.84 06/02/2023 1445   CALCIUM 8.9 06/02/2023 1445   GFRNONAA NOT CALCULATED 09/21/2010 1808   GFRAA  09/21/2010 1808    NOT CALCULATED        The eGFR has been calculated using the MDRD equation. This calculation has not been validated in all clinical situations. eGFR's persistently <60 mL/min signify possible Chronic Kidney Disease.   Lab Results  Component Value Date   HGBA1C 5.7 (H) 06/02/2023   HGBA1C 5.4 11/12/2020   Lab Results  Component Value Date   INSULIN 26.3 (H) 06/02/2023   INSULIN 18.4 11/12/2020   Lab Results  Component Value Date   TSH 3.160 06/02/2023   CBC    Component Value Date/Time   WBC 8.4 04/28/2022 0849   WBC 18.1 (H) 09/21/2010 1808   RBC 4.66 04/28/2022 0849   RBC 4.87 09/21/2010 1808   HGB 12.8 04/28/2022 0849   HCT 39.4 04/28/2022 0849   PLT 266 04/28/2022 0849   MCV 85  04/28/2022 0849   MCH 27.5 04/28/2022 0849   MCH 28.5 09/21/2010 1808   MCHC 32.5 04/28/2022 0849   MCHC 35.0 09/21/2010 1808   RDW 13.3 04/28/2022 0849   Iron Studies No results found for: "IRON", "TIBC", "FERRITIN", "IRONPCTSAT" Lipid Panel     Component Value Date/Time   CHOL 195 06/02/2023 1445   TRIG 199 (H) 06/02/2023 1445   HDL 69 06/02/2023 1445   CHOLHDL 2.8 06/02/2023 1445   LDLCALC 93 06/02/2023 1445   Hepatic Function Panel     Component Value Date/Time   PROT 6.9 06/02/2023 1445   ALBUMIN 4.0 06/02/2023 1445   AST 18 06/02/2023 1445   ALT 18 06/02/2023 1445   ALKPHOS 97 06/02/2023 1445   BILITOT <0.2 06/02/2023 1445      Component Value Date/Time   TSH 3.160 06/02/2023 1445   Nutritional  Lab Results  Component Value Date   VD25OH 56.7 06/02/2023   VD25OH 67.6 10/04/2022   VD25OH 49.5 04/28/2022    Attestations:   I, Isabelle Course, acting as a medical scribe for Thomasene Lot, DO., have compiled all relevant documentation for today's office visit on behalf of Thomasene Lot, DO, while in the presence of [employer], DO.  Reviewed by clinician on day of visit: allergies, medications, problem list, medical history, surgical history, family history, social history, and previous encounter notes pertinent to patient's obesity diagnosis.  I have spent 40 minutes in the care of the patient today including: preparing to see patient (e.g. review and interpretation of tests, old notes ), obtaining and/or reviewing separately obtained history, performing a medically appropriate examination or evaluation, counseling and educating the patient, ordering medications, test or procedures, documenting clinical information in the electronic or other health care record, and independently interpreting results and communicating results to the patient, family, or caregiver   I have reviewed the above documentation for accuracy and completeness, and I agree with the above.  Molly Cherry, D.O.  The 21st Century Cures Act was signed into law in 2016 which includes the topic of electronic health records.  This provides immediate access to information in MyChart.  This includes consultation notes, operative notes, office notes, lab results and pathology reports.  If you have any questions about what you read please let us know at your next visit so we can discuss your concerns and take corrective action if need be.  We are right here with you.

## 2023-07-28 ENCOUNTER — Encounter: Payer: Self-pay | Admitting: Family Medicine

## 2023-07-29 ENCOUNTER — Other Ambulatory Visit (INDEPENDENT_AMBULATORY_CARE_PROVIDER_SITE_OTHER): Payer: Self-pay | Admitting: Family Medicine

## 2023-07-29 ENCOUNTER — Encounter (INDEPENDENT_AMBULATORY_CARE_PROVIDER_SITE_OTHER): Payer: Self-pay | Admitting: Family Medicine

## 2023-07-29 DIAGNOSIS — E559 Vitamin D deficiency, unspecified: Secondary | ICD-10-CM

## 2023-08-01 ENCOUNTER — Ambulatory Visit (INDEPENDENT_AMBULATORY_CARE_PROVIDER_SITE_OTHER): Payer: Commercial Managed Care - HMO | Admitting: Family Medicine

## 2023-08-24 ENCOUNTER — Ambulatory Visit (INDEPENDENT_AMBULATORY_CARE_PROVIDER_SITE_OTHER): Payer: Commercial Managed Care - HMO | Admitting: Family Medicine

## 2023-08-24 ENCOUNTER — Encounter (INDEPENDENT_AMBULATORY_CARE_PROVIDER_SITE_OTHER): Payer: Self-pay | Admitting: Family Medicine

## 2023-08-24 VITALS — BP 137/87 | HR 83 | Temp 97.4°F | Ht 64.0 in | Wt 272.0 lb

## 2023-08-24 DIAGNOSIS — Z6841 Body Mass Index (BMI) 40.0 and over, adult: Secondary | ICD-10-CM

## 2023-08-24 DIAGNOSIS — E559 Vitamin D deficiency, unspecified: Secondary | ICD-10-CM

## 2023-08-24 DIAGNOSIS — E669 Obesity, unspecified: Secondary | ICD-10-CM

## 2023-08-24 DIAGNOSIS — G44229 Chronic tension-type headache, not intractable: Secondary | ICD-10-CM

## 2023-08-24 DIAGNOSIS — J3089 Other allergic rhinitis: Secondary | ICD-10-CM

## 2023-08-24 DIAGNOSIS — E66813 Obesity, class 3: Secondary | ICD-10-CM

## 2023-08-24 MED ORDER — LEVOCETIRIZINE DIHYDROCHLORIDE 5 MG PO TABS: 5.0000 mg | ORAL_TABLET | Freq: Every evening | ORAL | 0 refills | Status: DC

## 2023-08-24 MED ORDER — VITAMIN D (ERGOCALCIFEROL) 1.25 MG (50000 UNIT) PO CAPS: ORAL_CAPSULE | ORAL | 0 refills | Status: DC

## 2023-08-24 MED ORDER — TOPIRAMATE 50 MG PO TABS: ORAL_TABLET | ORAL | 0 refills | Status: DC

## 2023-08-24 NOTE — Progress Notes (Signed)
 Molly Cherry, D.O.  ABFM, ABOM Specializing in Clinical Bariatric Medicine  Office located at: [address] [place], [place]  [address]   Assessment and Plan:   FOR THE DISEASE OF OBESITY:  BMI 45.0-49.9, adult ([name]) Current BMI 46.67 Obesity with current BMI of 46.84 Assessment & Plan: Since last office visit on 07/13/2023 patient's  Muscle mass has decreased by 2.8 lb. Fat mass has increased by 3.4 lb. Total body water has increased by 1.8 lb.  Counseling done on how various foods will affect these numbers and how to maximize success  Total lbs lost to date: 5 lbs  Total weight loss percentage to date: 1.81%    Recommended Dietary Goals Molly Cherry is currently in the action stage of change. As such, her goal is to continue weight management plan.  She has agreed to: continue current plan   Behavioral Intervention We discussed the following today: making herself a priority, discussed pre-packaged healthier meals such as [employer]'s All Natural, Red's breakfast sandwich, Long-Life meal prep, and using CHAT-GPT for recipe ideas.  Additional resources provided today: None  Evidence-based interventions for health behavior change were utilized today including the discussion of self monitoring techniques, problem-solving barriers and SMART goal setting techniques.   Regarding patient's less desirable eating habits and patterns, we employed the technique of small changes.   Pt will specifically work on: Public house manager regarding coverage for [employer] & meal planning/prepping.   Recommended Physical Activity Goals Molly Cherry has been advised to work up to 150 minutes of moderate intensity aerobic activity a week and strengthening exercises 2-3 times per week for cardiovascular health, weight loss maintenance and preservation of muscle mass.   She has agreed to : continue to gradually increase the amount and intensity of exercise routine   Pharmacotherapy We  both agreed to : continue with nutritional and behavioral strategies, continue current medication regiment, and contact insurance about GLP-1 coverage.    FOR ASSOCIATED CONDITIONS ADDRESSED TODAY:  Chronic tension-type headache, not intractable Assessment & Plan: Condition controlled on Topiramate 50 mg daily - she is tolerating the medicine well. She denies carb cravings. Maintain with medication dose - will refill today.   Relevant Orders:  -     Topiramate; one tab by mouth daily.  Dispense: 90 tablet; Refill: 0   Environmental and seasonal allergies Assessment & Plan: Her allergy symptoms are controlled on Levocetirizine and Montelukast. No acute concerns. Requests refill of Levocetrizine. Continue medicine regimen. Continue low inflammatory meal plan.  Relevant Orders:   -     Levocetirizine Dihydrochloride; Take 1 tablet (5 mg total) by mouth every evening.  Dispense: 90 tablet; Refill: 0   Vitamin D deficiency Assessment & Plan: Pt reports good compliance and tolerance of ERGO 50K units twice weekly . Maintain with current regimen. Recheck-future.  Relevant Orders:  -     Vitamin D (Ergocalciferol); 1 po q wed and 1 po q sun  Dispense: 24 capsule; Refill: 0   Follow up:   Return 10/03/2023. She was informed of the importance of frequent follow up visits to maximize her success with intensive lifestyle modifications for her multiple health conditions.  Subjective:   Chief complaint: Obesity Molly Cherry is here to discuss her progress with her obesity treatment plan. She is on the Category 2 and journaling 1200-1400 calories and 85+ g of protein and states she is following her eating plan approximately 50% of the time. She states she is doing the elliptical 30 minutes  1 day per week.  Interval History:  Molly Cherry is here for a follow up office visit. Since last OV on 07/13/23, Molly Cherry is up 1 lb. During February, Molly Cherry was sick for about 3 weeks. During that  time she "did not have have an appetite" and was not eating much. She has been slowly getting back to eating regularly. In general, pt endorses it has been challenging to focus on her wt loss journey because of increased stress at her job. She will be switching to another company and is excited because of the better pay and more flexible schedule.   Pharmacotherapy for weight loss: She is currently taking  Wellbutrin SR 150 mg twice daily, Metformin 500 mg twice daily, Topamax 50 mg daily . Denies need for refill in Metformin or Wellbutrin.   Review of Systems:  Pertinent positives were addressed with patient today.  Reviewed by clinician on day of visit: allergies, medications, problem list, medical history, surgical history, family history, social history, and previous encounter notes.  Weight Summary and Biometrics   Weight Lost Since Last Visit: 0  Weight Gained Since Last Visit: 1 lb   Vitals Temp: (!) 97.4 F (36.3 C) BP: 137/87 Pulse Rate: 83 SpO2: 97 %   Anthropometric Measurements Height: 5\' 4"  (1.626 m) Weight: 272 lb (123.4 kg) BMI (Calculated): 46.67 Weight at Last Visit: 271 lb Weight Lost Since Last Visit: 0 Weight Gained Since Last Visit: 1 lb Starting Weight: 277 lb Total Weight Loss (lbs): 5 lb (2.268 kg) Peak Weight: 278 lb   Body Composition  Body Fat %: 51.5 % Fat Mass (lbs): 140 lbs Muscle Mass (lbs): 125.4 lbs Total Body Water (lbs): 96.4 lbs Visceral Fat Rating : 14   Other Clinical Data Fasting: Yes Today's Visit #: 46 Starting Date: 11/12/20   Objective:   PHYSICAL EXAM: Blood pressure 137/87, pulse 83, temperature (!) 97.4 F (36.3 C), height 5\' 4"  (1.626 m), weight 272 lb (123.4 kg), SpO2 97%. Body mass index is 46.69 kg/m.  General: she is overweight, cooperative and in no acute distress. PSYCH: Has normal mood, affect and thought process.   HEENT: EOMI, sclerae are anicteric. Lungs: Normal breathing effort, no conversational  dyspnea. Extremities: Moves * 4 Neurologic: A and O * 3, good insight  DIAGNOSTIC DATA REVIEWED: BMET    Component Value Date/Time   NA 139 06/02/2023 1445   K 4.6 06/02/2023 1445   CL 106 06/02/2023 1445   CO2 20 06/02/2023 1445   GLUCOSE 87 06/02/2023 1445   GLUCOSE 117 (H) 09/21/2010 1808   BUN 11 06/02/2023 1445   CREATININE 0.84 06/02/2023 1445   CALCIUM 8.9 06/02/2023 1445   GFRNONAA NOT CALCULATED 09/21/2010 1808   GFRAA  09/21/2010 1808    NOT CALCULATED        The eGFR has been calculated using the MDRD equation. This calculation has not been validated in all clinical situations. eGFR's persistently <60 mL/min signify possible Chronic Kidney Disease.   Lab Results  Component Value Date   HGBA1C 5.7 (H) 06/02/2023   HGBA1C 5.4 11/12/2020   Lab Results  Component Value Date   INSULIN 26.3 (H) 06/02/2023   INSULIN 18.4 11/12/2020   Lab Results  Component Value Date   TSH 3.160 06/02/2023   CBC    Component Value Date/Time   WBC 8.4 04/28/2022 0849   WBC 18.1 (H) 09/21/2010 1808   RBC 4.66 04/28/2022 0849   RBC 4.87 09/21/2010 1808   HGB 12.8  04/28/2022 0849   HCT 39.4 04/28/2022 0849   PLT 266 04/28/2022 0849   MCV 85 04/28/2022 0849   MCH 27.5 04/28/2022 0849   MCH 28.5 09/21/2010 1808   MCHC 32.5 04/28/2022 0849   MCHC 35.0 09/21/2010 1808   RDW 13.3 04/28/2022 0849   Iron Studies No results found for: "IRON", "TIBC", "FERRITIN", "IRONPCTSAT" Lipid Panel     Component Value Date/Time   CHOL 195 06/02/2023 1445   TRIG 199 (H) 06/02/2023 1445   HDL 69 06/02/2023 1445   CHOLHDL 2.8 06/02/2023 1445   LDLCALC 93 06/02/2023 1445   Hepatic Function Panel     Component Value Date/Time   PROT 6.9 06/02/2023 1445   ALBUMIN 4.0 06/02/2023 1445   AST 18 06/02/2023 1445   ALT 18 06/02/2023 1445   ALKPHOS 97 06/02/2023 1445   BILITOT <0.2 06/02/2023 1445      Component Value Date/Time   TSH 3.160 06/02/2023 1445   Nutritional Lab Results   Component Value Date   VD25OH 56.7 06/02/2023   VD25OH 67.6 10/04/2022   VD25OH 49.5 04/28/2022    Attestations:   I, Molly Cherry, acting as a Stage manager for [employer], DO., have compiled all relevant documentation for today's office visit on behalf of Molly Lot, DO, while in the presence of [employer], DO.  I have reviewed the above documentation for accuracy and completeness, and I agree with the above. Molly Cherry, D.O.  The 21st Century Cures Act was signed into law in 2016 which includes the topic of electronic health records.  This provides immediate access to information in MyChart.  This includes consultation notes, operative notes, office notes, lab results and pathology reports.  If you have any questions about what you read please let us know at your next visit so we can discuss your concerns and take corrective action if need be.  We are right here with you.

## 2023-09-21 ENCOUNTER — Other Ambulatory Visit: Payer: 59

## 2023-09-26 ENCOUNTER — Encounter: Payer: 59 | Admitting: Family Medicine

## 2023-09-26 ENCOUNTER — Encounter: Payer: 59 | Admitting: Nurse Practitioner

## 2023-10-03 ENCOUNTER — Ambulatory Visit (INDEPENDENT_AMBULATORY_CARE_PROVIDER_SITE_OTHER): Payer: Commercial Managed Care - HMO | Admitting: Family Medicine

## 2023-10-03 ENCOUNTER — Ambulatory Visit (INDEPENDENT_AMBULATORY_CARE_PROVIDER_SITE_OTHER): Admitting: Family Medicine

## 2023-10-31 ENCOUNTER — Encounter (INDEPENDENT_AMBULATORY_CARE_PROVIDER_SITE_OTHER): Payer: Self-pay | Admitting: Family Medicine

## 2023-10-31 ENCOUNTER — Ambulatory Visit (INDEPENDENT_AMBULATORY_CARE_PROVIDER_SITE_OTHER): Admitting: Family Medicine

## 2023-10-31 VITALS — BP 129/89 | HR 90 | Temp 97.5°F | Ht 64.0 in | Wt 275.0 lb

## 2023-10-31 DIAGNOSIS — E559 Vitamin D deficiency, unspecified: Secondary | ICD-10-CM

## 2023-10-31 DIAGNOSIS — E7849 Other hyperlipidemia: Secondary | ICD-10-CM | POA: Diagnosis not present

## 2023-10-31 DIAGNOSIS — R7303 Prediabetes: Secondary | ICD-10-CM | POA: Diagnosis not present

## 2023-10-31 DIAGNOSIS — Z6841 Body Mass Index (BMI) 40.0 and over, adult: Secondary | ICD-10-CM

## 2023-10-31 NOTE — Progress Notes (Signed)
 Molly Cherry, D.O.  ABFM, ABOM Specializing in Clinical Bariatric Medicine  Office located at: [address] [place], [place]  [address]   Assessment and Plan:   Orders Placed This Encounter  Procedures   VITAMIN D  25 Hydroxy (Vit-D Deficiency, Fractures)   Lipid panel   Hemoglobin A1c   Insulin , random   Pt instructed to come 3-4 business days prior to her next OV to obtain labs.   FOR THE DISEASE OF OBESITY:  BMI 45.0-49.9, adult ([name]) Current BMI 47.18 Morbid obesity ([name])-START BMI 47.55 Assessment & Plan: Since last office visit on 08/24/2023 patient's muscle mass has increased by 1.8 lb. Fat mass has increased by 1.2 lb. Total body water has decreased by 0.4 lb.  Counseling done on how various foods will affect these numbers and how to maximize success  Total lbs lost to date: - 2 lbs  Total weight loss percentage to date: 0.72%    Recommended Dietary Goals [name] is currently in the action stage of change. As such, her goal is to continue weight management plan.  She has agreed to: continue current plan   Behavioral Intervention We discussed the following today:  avoiding skipping meals, work on meal planning and preparation, work on tracking and journaling calories using tracking application, work on managing stress, creating time for self-care and relaxation, and discussed pre-packaged healthier meals such as Kevin's All Natural, Whole 30 chicken meals, Longlife meal prep and Factor Meals etc  Additional resources provided today: Handout on increasing daily activity and exercise goal setting  Evidence-based interventions for health behavior change were utilized today including the discussion of self monitoring techniques, problem-solving barriers and SMART goal setting techniques.   Regarding patient's less desirable eating habits and patterns, we employed the technique of small changes.   Pt will specifically work on meal planning q Mondays and  Thursdays & journaling more consistently.    Recommended Physical Activity Goals [name] has been advised to work up to 300-450 minutes of moderate intensity aerobic activity a week and strengthening exercises 2-3 times per week for cardiovascular health, weight loss maintenance and preservation of muscle mass.   She has agreed to : continue to gradually increase the amount and intensity of exercise routine   Pharmacotherapy We both agreed to : continue same regimen   ASSOCIATED CONDITIONS ADDRESSED TODAY:  Prediabetes Assessment & Plan: Most recent A1c and fasting insulin : Lab Results  Component Value Date   HGBA1C 5.7 (H) 06/02/2023   HGBA1C 5.4 10/04/2022   HGBA1C 5.5 04/28/2022   INSULIN  26.3 (H) 06/02/2023   INSULIN  22.1 10/04/2022   INSULIN  18.6 04/28/2022    Relevant medication: Metformin  500 mg twice daily - denies gastrointestinal issues. States that her cravings and hunger are controlled when following her prudent nutritional plan. Continue current medicine and working on nutrition plan to decrease simple carbohydrates, increase lean proteins and exercise to promote weight loss and improve glycemic control and prevent progression to T2DM. Will order labs today.    Other hyperlipidemia Assessment & Plan: Most recent lipid panel: Lab Results  Component Value Date   CHOL 195 06/02/2023   HDL 69 06/02/2023   LDLCALC 93 06/02/2023   TRIG 199 (H) 06/02/2023   CHOLHDL 2.8 06/02/2023    Her TRIG are elevated.Continue to work on nutrition plan -decreasing simple carbohydrates, increasing lean proteins, decreasing saturated fats and cholesterol , avoiding trans fats and exercise as able to promote weight loss, improve lipids and decrease cardiovascular risks.  Will order labs today.    Vitamin D  deficiency Assessment & Plan: Most recent Vitamin D :  Lab Results  Component Value Date   VD25OH 56.7 06/02/2023   VD25OH 67.6 10/04/2022   VD25OH 49.5 04/28/2022    Currently on ergocalciferol  50,000 units twice weekly without any adverse effects such as nausea, vomiting or muscle weakness. Continue vitamin D  supplementation. Will order labs today.    Follow up:   Return 12/01/2023 at 2:00 PM. She was informed of the importance of frequent follow up visits to maximize her success with intensive lifestyle modifications for her multiple health conditions.  Subjective:   Chief complaint: Obesity Molly Cherry is here to discuss her progress with her obesity treatment plan. She is on the Category 2 MP and journaling 1200-1400 calories and 85+ g of protein daily and states she is following her eating plan approximately 75% of the time. She states she is "walking the dog some" or going to the gym 45 minutes 5 days per week.  Interval History:  Molly Cherry is here for a follow up office visit. Since last OV on 08/24/2023, Ms.Mcgowan is up 3 lbs. She is sporadically journaling her intake. Overall, she feels that she did well with her food choices. States that her biggest challenge is meal planning/prepping.   Pharmacotherapy that aid with weight loss: She is currently taking Wellbutrin  SR 150 mg twice daily, Metformin  500 mg twice daily, Topamax  50 mg daily. States she is getting refills of all meds from the pharmacy.   Review of Systems:  Pertinent positives were addressed with patient today. Reviewed by clinician on day of visit: allergies, medications, problem list, medical history, surgical history, family history, social history, and previous encounter notes.  Weight Summary and Biometrics   Weight Lost Since Last Visit: 0  Weight Gained Since Last Visit: 3lb    Vitals Temp: (!) 97.5 F (36.4 C) BP: 129/89 Pulse Rate: (!) 119 SpO2: 99 %   Anthropometric Measurements Height: 5\' 4"  (1.626 m) Weight: 275 lb (124.7 kg) BMI (Calculated): 47.18 Weight at Last Visit: 272lb Weight Lost Since Last Visit: 0 Weight Gained Since Last Visit:  3lb Starting Weight: 277lb Total Weight Loss (lbs): 2 lb (0.907 kg)   Body Composition  Body Fat %: 51.3 % Fat Mass (lbs): 141.2 lbs Muscle Mass (lbs): 127.2 lbs Total Body Water (lbs): 96 lbs Visceral Fat Rating : 14   Other Clinical Data Fasting: no Labs: no Today's Visit #: 47 Starting Date: 11/12/20   Objective:   PHYSICAL EXAM: Blood pressure 129/89, pulse (!) 119, temperature (!) 97.5 F (36.4 C), height 5\' 4"  (1.626 m), weight 275 lb (124.7 kg), last menstrual period 10/12/2023, SpO2 99%. Body mass index is 47.2 kg/m.  General: she is overweight, cooperative and in no acute distress. PSYCH: Has normal mood, affect and thought process.   HEENT: EOMI, sclerae are anicteric. Lungs: Normal breathing effort, no conversational dyspnea. Extremities: Moves * 4 Neurologic: A and O * 3, good insight  DIAGNOSTIC DATA REVIEWED: BMET    Component Value Date/Time   NA 139 06/02/2023 1445   K 4.6 06/02/2023 1445   CL 106 06/02/2023 1445   CO2 20 06/02/2023 1445   GLUCOSE 87 06/02/2023 1445   GLUCOSE 117 (H) 09/21/2010 1808   BUN 11 06/02/2023 1445   CREATININE 0.84 06/02/2023 1445   CALCIUM 8.9 06/02/2023 1445   GFRNONAA NOT CALCULATED 09/21/2010 1808   GFRAA  09/21/2010 1808    NOT CALCULATED  The eGFR has been calculated using the MDRD equation. This calculation has not been validated in all clinical situations. eGFR's persistently <60 mL/min signify possible Chronic Kidney Disease.   Lab Results  Component Value Date   HGBA1C 5.7 (H) 06/02/2023   HGBA1C 5.4 11/12/2020   Lab Results  Component Value Date   INSULIN  26.3 (H) 06/02/2023   INSULIN  18.4 11/12/2020   Lab Results  Component Value Date   TSH 3.160 06/02/2023   CBC    Component Value Date/Time   WBC 8.4 04/28/2022 0849   WBC 18.1 (H) 09/21/2010 1808   RBC 4.66 04/28/2022 0849   RBC 4.87 09/21/2010 1808   HGB 12.8 04/28/2022 0849   HCT 39.4 04/28/2022 0849   PLT 266 04/28/2022  0849   MCV 85 04/28/2022 0849   MCH 27.5 04/28/2022 0849   MCH 28.5 09/21/2010 1808   MCHC 32.5 04/28/2022 0849   MCHC 35.0 09/21/2010 1808   RDW 13.3 04/28/2022 0849   Iron Studies No results found for: "IRON", "TIBC", "FERRITIN", "IRONPCTSAT" Lipid Panel     Component Value Date/Time   CHOL 195 06/02/2023 1445   TRIG 199 (H) 06/02/2023 1445   HDL 69 06/02/2023 1445   CHOLHDL 2.8 06/02/2023 1445   LDLCALC 93 06/02/2023 1445   Hepatic Function Panel     Component Value Date/Time   PROT 6.9 06/02/2023 1445   ALBUMIN 4.0 06/02/2023 1445   AST 18 06/02/2023 1445   ALT 18 06/02/2023 1445   ALKPHOS 97 06/02/2023 1445   BILITOT <0.2 06/02/2023 1445      Component Value Date/Time   TSH 3.160 06/02/2023 1445   Nutritional Lab Results  Component Value Date   VD25OH 56.7 06/02/2023   VD25OH 67.6 10/04/2022   VD25OH 49.5 04/28/2022    Attestations:   I, Special Puri , acting as a Stage manager for [employer], DO., have compiled all relevant documentation for today's office visit on behalf of Marceil Sensor, DO, while in the presence of [employer], DO.  I have spent X minutes in the care of the patient today including: preparing to see patient (e.g. review and interpretation of tests, old notes ), obtaining and/or reviewing separately obtained history, performing a medically appropriate examination or evaluation, counseling and educating the patient, ordering medications, test or procedures, documenting clinical information in the electronic or other health care record, and independently interpreting results and communicating results to the patient, family, or caregiver   I have reviewed the above documentation for accuracy and completeness, and I agree with the above. Molly Cherry, D.O.  The 21st Century Cures Act was signed into law in 2016 which includes the topic of electronic health records.  This provides immediate access to information in MyChart.  This  includes consultation notes, operative notes, office notes, lab results and pathology reports.  If you have any questions about what you read please let us  know at your next visit so we can discuss your concerns and take corrective action if need be.  We are right here with you.

## 2023-11-25 LAB — LIPID PANEL
Chol/HDL Ratio: 2.9 ratio (ref 0.0–4.4)
Cholesterol, Total: 199 mg/dL (ref 100–199)
HDL: 68 mg/dL (ref >39)
LDL Chol Calc (NIH): 104 mg/dL — ABNORMAL HIGH (ref 0–99)
Triglycerides: 157 mg/dL — ABNORMAL HIGH (ref 0–149)
VLDL Cholesterol Cal: 27 mg/dL (ref 5–40)

## 2023-11-25 LAB — HEMOGLOBIN A1C
Est. average glucose Bld gHb Est-mCnc: 108 mg/dL
Hgb A1c MFr Bld: 5.4 % (ref 4.8–5.6)

## 2023-11-25 LAB — VITAMIN D 25 HYDROXY (VIT D DEFICIENCY, FRACTURES): Vit D, 25-Hydroxy: 56.2 ng/mL (ref 30.0–100.0)

## 2023-11-25 LAB — INSULIN, RANDOM: INSULIN: 14 u[IU]/mL (ref 2.6–24.9)

## 2023-12-01 ENCOUNTER — Encounter (INDEPENDENT_AMBULATORY_CARE_PROVIDER_SITE_OTHER): Payer: Self-pay | Admitting: Family Medicine

## 2023-12-01 ENCOUNTER — Ambulatory Visit (INDEPENDENT_AMBULATORY_CARE_PROVIDER_SITE_OTHER): Admitting: Family Medicine

## 2023-12-01 VITALS — BP 126/83 | HR 104 | Temp 97.9°F | Ht 64.0 in | Wt 272.0 lb

## 2023-12-01 DIAGNOSIS — Z6841 Body Mass Index (BMI) 40.0 and over, adult: Secondary | ICD-10-CM

## 2023-12-01 DIAGNOSIS — F39 Unspecified mood [affective] disorder: Secondary | ICD-10-CM

## 2023-12-01 DIAGNOSIS — E669 Obesity, unspecified: Secondary | ICD-10-CM

## 2023-12-01 DIAGNOSIS — G44229 Chronic tension-type headache, not intractable: Secondary | ICD-10-CM | POA: Diagnosis not present

## 2023-12-01 DIAGNOSIS — E559 Vitamin D deficiency, unspecified: Secondary | ICD-10-CM | POA: Diagnosis not present

## 2023-12-01 DIAGNOSIS — E7849 Other hyperlipidemia: Secondary | ICD-10-CM

## 2023-12-01 DIAGNOSIS — E66813 Obesity, class 3: Secondary | ICD-10-CM

## 2023-12-01 DIAGNOSIS — E88819 Insulin resistance, unspecified: Secondary | ICD-10-CM | POA: Diagnosis not present

## 2023-12-01 DIAGNOSIS — J3089 Other allergic rhinitis: Secondary | ICD-10-CM

## 2023-12-01 DIAGNOSIS — J302 Other seasonal allergic rhinitis: Secondary | ICD-10-CM

## 2023-12-01 DIAGNOSIS — F5089 Other specified eating disorder: Secondary | ICD-10-CM

## 2023-12-01 MED ORDER — BUPROPION HCL ER (SR) 150 MG PO TB12
ORAL_TABLET | ORAL | 0 refills | Status: DC
Start: 2023-12-01 — End: 2024-03-05

## 2023-12-01 MED ORDER — VITAMIN D (ERGOCALCIFEROL) 1.25 MG (50000 UNIT) PO CAPS: ORAL_CAPSULE | ORAL | 0 refills | Status: DC

## 2023-12-01 MED ORDER — MONTELUKAST SODIUM 10 MG PO TABS: 10.0000 mg | ORAL_TABLET | Freq: Every day | ORAL | 0 refills | Status: DC

## 2023-12-01 MED ORDER — TOPIRAMATE 50 MG PO TABS: ORAL_TABLET | ORAL | 0 refills | Status: DC

## 2023-12-01 MED ORDER — METFORMIN HCL 500 MG PO TABS
ORAL_TABLET | ORAL | 0 refills | Status: DC
Start: 2023-12-01 — End: 2024-03-05

## 2023-12-01 MED ORDER — LEVOCETIRIZINE DIHYDROCHLORIDE 5 MG PO TABS: 5.0000 mg | ORAL_TABLET | Freq: Every evening | ORAL | 0 refills | Status: DC

## 2023-12-01 NOTE — Progress Notes (Signed)
 Molly DOROTHA Cherry, D.O.  ABFM, ABOM Specializing in Clinical Bariatric Medicine  Office located at: [address] [place], [place]  [address]   Assessment and Plan:  No orders of the defined types were placed in this encounter.   Medications Discontinued During This Encounter  Medication Reason   buPROPion  (WELLBUTRIN  SR) 150 MG 12 hr tablet Reorder   metFORMIN  (GLUCOPHAGE ) 500 MG tablet Reorder   montelukast  (SINGULAIR ) 10 MG tablet Reorder   Vitamin D , Ergocalciferol , (DRISDOL ) 1.25 MG (50000 UNIT) CAPS capsule Reorder   topiramate  (TOPAMAX ) 50 MG tablet Reorder   levocetirizine (XYZAL ) 5 MG tablet Reorder     Meds ordered this encounter  Medications   levocetirizine (XYZAL ) 5 MG tablet    Sig: Take 1 tablet (5 mg total) by mouth every evening.    Dispense:  90 tablet    Refill:  0    90 d supply;  ** OV for RF **   Do not send RF request   metFORMIN  (GLUCOPHAGE ) 500 MG tablet    Sig: 1 po with lunch and dinner daily    Dispense:  180 tablet    Refill:  0    90 day supply;  ** OV for RF **   Do not send RF request   montelukast  (SINGULAIR ) 10 MG tablet    Sig: Take 1 tablet (10 mg total) by mouth at bedtime.    Dispense:  90 tablet    Refill:  0    90 day supply;  ** OV for RF **   Do not send RF request   topiramate  (TOPAMAX ) 50 MG tablet    Sig: one tab by mouth bid    Dispense:  180 tablet    Refill:  0   Vitamin D , Ergocalciferol , (DRISDOL ) 1.25 MG (50000 UNIT) CAPS capsule    Sig: 1 po q wed and 1 po q sun    Dispense:  24 capsule    Refill:  0    Please dispense 90 d supply.  ** OV for RF **   Do not send RF request   buPROPion  (WELLBUTRIN  SR) 150 MG 12 hr tablet    Sig: 1 po q am and 1 po q late afternoon    Dispense:  180 tablet    Refill:  0    Ov for rf      FOR THE DISEASE OF OBESITY: BMI 45.0-49.9, adult -- Current BMI 46.67 Obesity -START BMI 47.55 Assessment & Plan: Since last office visit on 10/31/23 patient's muscle mass has increased  by 0.4 lb. Fat mass has decreased by 3 lb. Total body water has increased by 0.8  lb.  Counseling done on how various foods will affect these numbers and how to maximize success  Total lbs lost to date: 5 lbs Total weight loss percentage to date: -1.81%    Recommended Dietary Goals Molly Cherry is currently in the action stage of change. As such, her goal is to continue weight management plan.  She has agreed to: continue current plan   Behavioral Intervention We discussed the following today: increasing lean protein intake to established goals, practice mindfulness eating and understand the difference between hunger signals and cravings, and work on managing stress, creating time for self-care and relaxation  Additional resources provided today: None  Evidence-based interventions for health behavior change were utilized today including the discussion of self monitoring techniques, problem-solving barriers and SMART goal setting techniques.   Regarding patient's less desirable  eating habits and patterns, we employed the technique of small changes.   Pt will specifically work on: Increase NEAT for next visit.    Recommended Physical Activity Goals Molly Cherry has been advised to work up to 300-450 minutes of moderate intensity aerobic activity a week and strengthening exercises 2-3 times per week for cardiovascular health, weight loss maintenance and preservation of muscle mass.   She has agreed to: Increase physical activity in their day and reduce sedentary time (increase NEAT).   Pharmacotherapy We both agreed to: Continue with current nutritional and behavioral strategies and Continue Metformin . Increase Topamax  dose to BID.    ASSOCIATED CONDITIONS ADDRESSED TODAY: Insulin  resistance Assessment & Plan: Lab Results  Component Value Date   HGBA1C 5.4 11/24/2023   HGBA1C 5.7 (H) 06/02/2023   HGBA1C 5.4 10/04/2022   INSULIN  14.0 11/24/2023   INSULIN  26.3 (H) 06/02/2023   INSULIN   22.1 10/04/2022    Pt requested we review labs obtained on 6/5. A1c and insulin  levels improved from prior labs 6 months ago. She is compliant with Metformin  500 mg BID with good tolerance. No adverse side effects reported. No acute concerns today. Continue current med regimen, refill Metformin . Increase daily protein intake. Continue working on following meal plan and increasing NEAT.   Orders: - Refill Metformin , no changes   Chronic tension-type headache, not intractable Assessment & Plan: Condition overall stable. Compliant with Topamax  50 mg once daily. Tolerating well, no SE.  Discussed the option of increasing Topamax . Will increase dose from 50 mg once daily to twice daily. Initiate with 50 mg in the morning and 25 mg in the evening. If tolerated well, increased to 50 mg twice daily after 4 days. If not tolerating well or is experiencing fatigue, decrease back to 1 tab once daily.   Orders: - Increase Topamax  to twice daily   Vitamin D  deficiency Assessment & Plan: Lab Results  Component Value Date   VD25OH 56.2 11/24/2023   VD25OH 56.7 06/02/2023   VD25OH 67.6 10/04/2022   Pt requested we review labs obtained on 6/5. Vit D levels are stable and at goal. Reviewed ideal vit D goal of 50-70, pt verbalized understanding.Continue supplementation, will refill ERGO with no dose changes made today.  Orders: - Refill ERGO, no changes    Mood disorder ([name])- Emotional eating Assessment & Plan: Moods are stable. Denies SI/HI. Reports increased sweet cravings and emotional cravings, which she attributes to starting her menses soon. Reviewed importance of stress management and how moods can affect cravings and eating habits. Encourage removal of unhealthy or off-plan foods from the home to help manage cravings.   Orders: - Refill Wellbutrin , no dose changes   Other hyperlipidemia Assessment & Plan: Lab Results  Component Value Date   CHOL 199 11/24/2023   HDL 68 11/24/2023    LDLCALC 104 (H) 11/24/2023   TRIG 157 (H) 11/24/2023   CHOLHDL 2.9 11/24/2023   Pt requested we review labs obtained on 6/5. LDL and TGs are elevated at 104 and 157 respectively. No meds currently; diet/exercise approach. Decrease simple carbs/sugars and reduce consumption of saturated/trans fats. Avoid fatty meats, butters, creams, etc. Follow heart-healthy diet per prudent nutritional meal plan.    Environmental and seasonal allergies Assessment & Plan: Compliant with Xyzal  5 mg once daily and Singulair  10 mg once daily. Tolerating well with no side effects reported today. No acute concerns. Continue current regimen as prescribed. Will continue monitoring.  Orders: - Refill Xyzal , no changes - Refill Singulair , no  dose changes   Follow up:   Return in about 4 weeks (around 12/29/2023) for Keep upcoming appt 7/7, make another appt if pt desires. . She was informed of the importance of frequent follow up visits to maximize her success with intensive lifestyle modifications for her multiple health conditions.  Subjective:   Chief complaint: Obesity Molly Cherry is here to discuss her progress with her obesity treatment plan. She is on the Category 2 MP and journaling 1200-1400 calories and 85+ g of protein daily and states she is following her eating plan approximately 80% of the time. She states she is going to the gym 30 minutes 3 days per week and walking 30 minutes 2 days per week.   Interval History:  Molly Cherry is here for a follow up office visit. Since last OV on 10/31/23, she reports struggling with her meal plan and at times eats off-plan snacks. She reports emotional cravings, which she partially attributes to her starting her menses soon.   Pharmacotherapy for weight loss: She is currently taking Wellbutrin , Metformin , and Topamax .   Review of Systems:  Pertinent positives were addressed with patient today.  Reviewed by clinician on day of visit: allergies, medications,  problem list, medical history, surgical history, family history, social history, and previous encounter notes.  Weight Summary and Biometrics   Weight Lost Since Last Visit: 3lb  Weight Gained Since Last Visit: 0lb    Vitals Temp: 97.9 F (36.6 C) BP: 126/83 Pulse Rate: (!) 104 SpO2: 98 %   Anthropometric Measurements Height: 5' 4 (1.626 m) Weight: 272 lb (123.4 kg) BMI (Calculated): 46.67 Weight at Last Visit: 275lb Weight Lost Since Last Visit: 3lb Weight Gained Since Last Visit: 0lb Starting Weight: 277lb Total Weight Loss (lbs): 5 lb (2.268 kg)   Body Composition  Body Fat %: 50.7 % Fat Mass (lbs): 138.2 lbs Muscle Mass (lbs): 127.6 lbs Total Body Water (lbs): 96.8 lbs Visceral Fat Rating : 14   Other Clinical Data Fasting: No' Labs: No Today's Visit #: 48 Starting Date: 11/12/20    Objective:   PHYSICAL EXAM: Blood pressure 126/83, pulse (!) 104, temperature 97.9 F (36.6 C), height 5' 4 (1.626 m), weight 272 lb (123.4 kg), SpO2 98%. Body mass index is 46.69 kg/m.  General: she is overweight, cooperative and in no acute distress. PSYCH: Has normal mood, affect and thought process.   HEENT: EOMI, sclerae are anicteric. Lungs: Normal breathing effort, no conversational dyspnea. Extremities: Moves * 4 Neurologic: A and O * 3, good insight  DIAGNOSTIC DATA REVIEWED: BMET    Component Value Date/Time   NA 139 06/02/2023 1445   K 4.6 06/02/2023 1445   CL 106 06/02/2023 1445   CO2 20 06/02/2023 1445   GLUCOSE 87 06/02/2023 1445   GLUCOSE 117 (H) 09/21/2010 1808   BUN 11 06/02/2023 1445   CREATININE 0.84 06/02/2023 1445   CALCIUM 8.9 06/02/2023 1445   GFRNONAA NOT CALCULATED 09/21/2010 1808   GFRAA  09/21/2010 1808    NOT CALCULATED        The eGFR has been calculated using the MDRD equation. This calculation has not been validated in all clinical situations. eGFR's persistently <60 mL/min signify possible Chronic Kidney Disease.    Lab Results  Component Value Date   HGBA1C 5.4 11/24/2023   HGBA1C 5.4 11/12/2020   Lab Results  Component Value Date   INSULIN  14.0 11/24/2023   INSULIN  18.4 11/12/2020   Lab Results  Component Value Date   TSH  3.160 06/02/2023   CBC    Component Value Date/Time   WBC 8.4 04/28/2022 0849   WBC 18.1 (H) 09/21/2010 1808   RBC 4.66 04/28/2022 0849   RBC 4.87 09/21/2010 1808   HGB 12.8 04/28/2022 0849   HCT 39.4 04/28/2022 0849   PLT 266 04/28/2022 0849   MCV 85 04/28/2022 0849   MCH 27.5 04/28/2022 0849   MCH 28.5 09/21/2010 1808   MCHC 32.5 04/28/2022 0849   MCHC 35.0 09/21/2010 1808   RDW 13.3 04/28/2022 0849   Iron Studies No results found for: IRON, TIBC, FERRITIN, IRONPCTSAT Lipid Panel     Component Value Date/Time   CHOL 199 11/24/2023 1237   TRIG 157 (H) 11/24/2023 1237   HDL 68 11/24/2023 1237   CHOLHDL 2.9 11/24/2023 1237   LDLCALC 104 (H) 11/24/2023 1237   Hepatic Function Panel     Component Value Date/Time   PROT 6.9 06/02/2023 1445   ALBUMIN 4.0 06/02/2023 1445   AST 18 06/02/2023 1445   ALT 18 06/02/2023 1445   ALKPHOS 97 06/02/2023 1445   BILITOT <0.2 06/02/2023 1445      Component Value Date/Time   TSH 3.160 06/02/2023 1445   Nutritional Lab Results  Component Value Date   VD25OH 56.2 11/24/2023   VD25OH 56.7 06/02/2023   VD25OH 67.6 10/04/2022    Attestations:   I, Vernell Forest, acting as a Stage manager for Molly Jenkins, DO., have compiled all relevant documentation for today's office visit on behalf of Molly Jenkins, DO, while in the presence of [employer], DO.  Reviewed by clinician on day of visit: allergies, medications, problem list, medical history, surgical history, family history, social history, and previous encounter notes pertinent to patient's obesity diagnosis.  I have reviewed the above documentation for accuracy and completeness, and I agree with the above. Molly Cherry, D.O.  The 21st  Century Cures Act was signed into law in 2016 which includes the topic of electronic health records.  This provides immediate access to information in MyChart.  This includes consultation notes, operative notes, office notes, lab results and pathology reports.  If you have any questions about what you read please let us  know at your next visit so we can discuss your concerns and take corrective action if need be.  We are right here with you.

## 2023-12-26 ENCOUNTER — Ambulatory Visit (INDEPENDENT_AMBULATORY_CARE_PROVIDER_SITE_OTHER): Admitting: Family Medicine

## 2023-12-26 ENCOUNTER — Encounter (INDEPENDENT_AMBULATORY_CARE_PROVIDER_SITE_OTHER): Payer: Self-pay | Admitting: Family Medicine

## 2023-12-26 VITALS — BP 119/87 | HR 109 | Temp 97.9°F | Ht 64.0 in | Wt 270.0 lb

## 2023-12-26 DIAGNOSIS — E7849 Other hyperlipidemia: Secondary | ICD-10-CM

## 2023-12-26 DIAGNOSIS — E66813 Obesity, class 3: Secondary | ICD-10-CM

## 2023-12-26 DIAGNOSIS — G44229 Chronic tension-type headache, not intractable: Secondary | ICD-10-CM | POA: Diagnosis not present

## 2023-12-26 DIAGNOSIS — E669 Obesity, unspecified: Secondary | ICD-10-CM

## 2023-12-26 DIAGNOSIS — E88819 Insulin resistance, unspecified: Secondary | ICD-10-CM | POA: Diagnosis not present

## 2023-12-26 DIAGNOSIS — Z6841 Body Mass Index (BMI) 40.0 and over, adult: Secondary | ICD-10-CM

## 2023-12-26 NOTE — Progress Notes (Signed)
 Molly Cherry, D.O.  ABFM, ABOM Specializing in Clinical Bariatric Medicine  Office located at: [address] [place], [place]  [address]   Assessment and Plan:  No orders of the defined types were placed in this encounter.   There are no discontinued medications.   No orders of the defined types were placed in this encounter.    FOR THE DISEASE OF OBESITY:  ***  Since last office visit on 12/01/2023 patient's muscle mass has increased by 4.6 lbs. Fat mass has decreased by 6.6 lbs. Total body water has increased by 4.2 lbs.  Body fat percentage has improved by 2.1%. Counseling done on how various foods will affect these numbers and how to maximize success  Total lbs lost to date: - 7 lbs  Total weight loss percentage to date: 2.53%    Recommended Dietary Goals Danalee is currently in the action stage of change. As such, her goal is to continue weight management plan.  She has agreed to: continue current plan   Behavioral Intervention We discussed the following today: avoiding skipping meals, work on meal planning and preparation, and focusing on food with a 10:1 ratio of calories: grams of protein  Additional resources provided today: {[id number]::None}  Evidence-based interventions for health behavior change were utilized today including the discussion of self monitoring techniques, problem-solving barriers and SMART goal setting techniques.   Regarding patient's less desirable eating habits and patterns, we employed the technique of small changes.   Pt will work on avoiding skipping breakfast (consider drinkable high protein yogurts or protein shakes).   Recommended Physical Activity Goals Sherrye has been advised to work up to 300-450 minutes of moderate intensity aerobic activity a week and strengthening exercises 2-3 times per week for cardiovascular health, weight loss maintenance and preservation of muscle mass.   She has agreed to: Increase  physical activity in their day and reduce sedentary time (increase NEAT).   Pharmacotherapy We both agreed to continue current regimen.    ASSOCIATED CONDITIONS ADDRESSED TODAY:   Insulin  resistance Assessment & Plan: Lab Results  Component Value Date   HGBA1C 5.4 11/24/2023   HGBA1C 5.7 (H) 06/02/2023   HGBA1C 5.4 10/04/2022   INSULIN  14.0 11/24/2023   INSULIN  26.3 (H) 06/02/2023   INSULIN  22.1 10/04/2022   Reports good compliance and tolerance Metformin  500 mg tw     - Continue to decrease simple carbs/ sugars; increase fiber and proteins -> follow her meal plan/journal intake.       Other hyperlipidemia Assessment & Plan: Lab Results  Component Value Date   CHOL 199 11/24/2023   HDL 68 11/24/2023   LDLCALC 104 (H) 11/24/2023   TRIG 157 (H) 11/24/2023   CHOLHDL 2.9 11/24/2023   Managed with diet and life style interventions.  LDL and TGs are elevated at 104 and 157 respectively. Decrease simple carbs/sugars and reduce consumption of saturated/trans fats. Avoid fatty meats, butters, creams, etc. Follow heart-healthy diet per prudent nutritional meal plan.     Chronic tension-type headache, not intractable Assessment & Plan: Last office visit, I increased her Topiramate  from 50 mg daily to BID. She is tolerating this dose well; denies experiencing fatigue. Good control of hunger and cravings. Continue regimen and prudent nutritional plan.     Follow up:   Return 01/23/2024 at 2:20 PM. She was informed of the importance of frequent follow up visits to maximize her success with intensive lifestyle modifications for her multiple health conditions.   Subjective:  Chief complaint: Obesity Tranesha is here to discuss her progress with her obesity treatment plan. She is on the Category 2 MP and journaling 1200-1400 calories and 85+ g of protein daily and states she is following her eating plan approximately 70% of the time. She states she is swimming 180 minutes 4  days per week.  Interval History:  Anais Preble is here for a follow up office visit. Since last OV on 12/01/2023 , she is down 2 lbs. She enjoyed her 4th of July weekend at [facility]. She occasionally skips breakfast. Overall she endorses being protein focused and measuring her protein intake. She occasionally skips breakfast. Reports eating out less this past month.    Pharmacotherapy that aid with weight loss: She is currently taking Wellbutrin  SR 150 mg twice daily, Metformin  500 mg twice daily, and Topiramate  50 mg BID.    Review of Systems:  Pertinent positives were addressed with patient today.  Reviewed by clinician on day of visit: allergies, medications, problem list, medical history, surgical history, family history, social history, and previous encounter notes.  Weight Summary and Biometrics   Weight Lost Since Last Visit: 3lb  Weight Gained Since Last Visit: 0lb   Vitals Temp: 97.9 F (36.6 C) BP: 119/87 Pulse Rate: (!) 109 SpO2: 98 %   Anthropometric Measurements Height: 5' 4 (1.626 m) Weight: 270 lb (122.5 kg) BMI (Calculated): 46.32 Weight at Last Visit: 272lb Weight Lost Since Last Visit: 3lb Weight Gained Since Last Visit: 0lb Starting Weight: 277lb Total Weight Loss (lbs): 8 lb (3.629 kg)   Body Composition  Body Fat %: 48.6 % Fat Mass (lbs): 131.6 lbs Muscle Mass (lbs): 132.2 lbs Total Body Water (lbs): 101 lbs Visceral Fat Rating : 13   Other Clinical Data Fasting: No Labs: no Today's Visit #: 49 Starting Date: 11/12/20    Objective:   PHYSICAL EXAM: Blood pressure 119/87, pulse (!) 109, temperature 97.9 F (36.6 C), height 5' 4 (1.626 m), weight 270 lb (122.5 kg), SpO2 98%. Body mass index is 46.35 kg/m.  General: she is overweight, cooperative and in no acute distress. PSYCH: Has normal mood, affect and thought process.   HEENT: EOMI, sclerae are anicteric. Lungs: Normal breathing effort, no conversational  dyspnea. Extremities: Moves * 4 Neurologic: A and O * 3, good insight  DIAGNOSTIC DATA REVIEWED: BMET    Component Value Date/Time   NA 139 06/02/2023 1445   K 4.6 06/02/2023 1445   CL 106 06/02/2023 1445   CO2 20 06/02/2023 1445   GLUCOSE 87 06/02/2023 1445   GLUCOSE 117 (H) 09/21/2010 1808   BUN 11 06/02/2023 1445   CREATININE 0.84 06/02/2023 1445   CALCIUM 8.9 06/02/2023 1445   GFRNONAA NOT CALCULATED 09/21/2010 1808   GFRAA  09/21/2010 1808    NOT CALCULATED        The eGFR has been calculated using the MDRD equation. This calculation has not been validated in all clinical situations. eGFR's persistently <60 mL/min signify possible Chronic Kidney Disease.   Lab Results  Component Value Date   HGBA1C 5.4 11/24/2023   HGBA1C 5.4 11/12/2020   Lab Results  Component Value Date   INSULIN  14.0 11/24/2023   INSULIN  18.4 11/12/2020   Lab Results  Component Value Date   TSH 3.160 06/02/2023   CBC    Component Value Date/Time   WBC 8.4 04/28/2022 0849   WBC 18.1 (H) 09/21/2010 1808   RBC 4.66 04/28/2022 0849   RBC 4.87 09/21/2010 1808  HGB 12.8 04/28/2022 0849   HCT 39.4 04/28/2022 0849   PLT 266 04/28/2022 0849   MCV 85 04/28/2022 0849   MCH 27.5 04/28/2022 0849   MCH 28.5 09/21/2010 1808   MCHC 32.5 04/28/2022 0849   MCHC 35.0 09/21/2010 1808   RDW 13.3 04/28/2022 0849   Iron Studies No results found for: IRON, TIBC, FERRITIN, IRONPCTSAT Lipid Panel     Component Value Date/Time   CHOL 199 11/24/2023 1237   TRIG 157 (H) 11/24/2023 1237   HDL 68 11/24/2023 1237   CHOLHDL 2.9 11/24/2023 1237   LDLCALC 104 (H) 11/24/2023 1237   Hepatic Function Panel     Component Value Date/Time   PROT 6.9 06/02/2023 1445   ALBUMIN 4.0 06/02/2023 1445   AST 18 06/02/2023 1445   ALT 18 06/02/2023 1445   ALKPHOS 97 06/02/2023 1445   BILITOT <0.2 06/02/2023 1445      Component Value Date/Time   TSH 3.160 06/02/2023 1445   Nutritional Lab Results   Component Value Date   VD25OH 56.2 11/24/2023   VD25OH 56.7 06/02/2023   VD25OH 67.6 10/04/2022    Attestations:   I, Special Puri, acting as a Stage manager for [employer], DO., have compiled all relevant documentation for today's office visit on behalf of Molly Jenkins, DO, while in the presence of [employer], DO.  Reviewed by clinician on day of visit: allergies, medications, problem list, medical history, surgical history, family history, social history, and previous encounter notes pertinent to patient's obesity diagnosis.  I have spent X minutes in the care of the patient today including: preparing to see patient (e.g. review and interpretation of tests, old notes ), obtaining and/or reviewing separately obtained history, performing a medically appropriate examination or evaluation, counseling and educating the patient, ordering medications, test or procedures, documenting clinical information in the electronic or other health care record, and independently interpreting results and communicating results to the patient, family, or caregiver   I have reviewed the above documentation for accuracy and completeness, and I agree with the above. Molly JINNY Cherry, D.O.  The 21st Century Cures Act was signed into law in 2016 which includes the topic of electronic health records.  This provides immediate access to information in MyChart.  This includes consultation notes, operative notes, office notes, lab results and pathology reports.  If you have any questions about what you read please let us  know at your next visit so we can discuss your concerns and take corrective action if need be.  We are right here with you.

## 2024-01-23 ENCOUNTER — Encounter (INDEPENDENT_AMBULATORY_CARE_PROVIDER_SITE_OTHER): Payer: Self-pay | Admitting: Family Medicine

## 2024-01-23 ENCOUNTER — Ambulatory Visit (INDEPENDENT_AMBULATORY_CARE_PROVIDER_SITE_OTHER): Admitting: Family Medicine

## 2024-01-23 VITALS — BP 123/85 | HR 119 | Temp 98.5°F | Ht 64.0 in | Wt 271.0 lb

## 2024-01-23 DIAGNOSIS — R7303 Prediabetes: Secondary | ICD-10-CM | POA: Diagnosis not present

## 2024-01-23 DIAGNOSIS — F5089 Other specified eating disorder: Secondary | ICD-10-CM

## 2024-01-23 DIAGNOSIS — E669 Obesity, unspecified: Secondary | ICD-10-CM

## 2024-01-23 DIAGNOSIS — E559 Vitamin D deficiency, unspecified: Secondary | ICD-10-CM | POA: Diagnosis not present

## 2024-01-23 DIAGNOSIS — F39 Unspecified mood [affective] disorder: Secondary | ICD-10-CM | POA: Diagnosis not present

## 2024-01-23 DIAGNOSIS — Z6841 Body Mass Index (BMI) 40.0 and over, adult: Secondary | ICD-10-CM

## 2024-01-23 DIAGNOSIS — G44229 Chronic tension-type headache, not intractable: Secondary | ICD-10-CM

## 2024-01-23 DIAGNOSIS — E66813 Obesity, class 3: Secondary | ICD-10-CM

## 2024-01-23 NOTE — Progress Notes (Signed)
 Molly Cherry, D.O.  ABFM, ABOM Specializing in Clinical Bariatric Medicine  Office located at: [address] [place], [place]  [address]   Assessment and Plan:   FOR THE DISEASE OF OBESITY:  BMI 45.0-49.9, adult -- Current BMI 46.52 Obesity -START BMI 47.55 Assessment & Plan: Since last office visit on 12/26/2023 patient's muscle mass has increased by 2 lbs. Fat mass has decreased by 1.6 lbs. Total body water has increased by 1.2 lbs.  Body fat % has decreased  by 0.7 %. Counseling done on how various foods will affect these numbers and how to maximize success  Total lbs lost to date: - 6 lbs Total weight loss percentage to date: -2.17 %   Recommended Dietary Goals Molly Cherry is currently in the action stage of change. As such, her goal is to continue weight management plan.  She has agreed to: continue current plan   Behavioral Intervention We discussed the following today: protein shake options for a quick breakfast, avoiding skipping meals, increasing water intake , and work on meal planning and preparation  Additional resources provided today: None  Evidence-based interventions for health behavior change were utilized today including the discussion of self monitoring techniques, problem-solving barriers and SMART goal setting techniques.   Regarding patient's less desirable eating habits and patterns, we employed the technique of small changes.   Goal: meal prep on Mondays   Recommended Physical Activity Goals Alaija has been advised to work up to 300-450 minutes of moderate intensity aerobic activity a week and strengthening exercises 2-3 times per week for cardiovascular health, weight loss maintenance and preservation of muscle mass.   She has agreed to: continue to gradually increase the amount and intensity of exercise routine   Pharmacotherapy Continue same regimen.   ASSOCIATED CONDITIONS ADDRESSED TODAY:   Prediabetes Assessment & Plan: Lab  Results  Component Value Date   HGBA1C 5.4 11/24/2023   HGBA1C 5.7 (H) 06/02/2023   HGBA1C 5.4 10/04/2022   INSULIN  14.0 11/24/2023   INSULIN  26.3 (H) 06/02/2023   INSULIN  22.1 10/04/2022    Reports good compliance and tolerance of Metformin  500 mg twice daily. Overall she has good control of hunger and cravings when following her prudent nutritional plan. No acute concerns.   - Continue Metformin  therapy and journaling intake with an emphasis on decreasing simple carbs/ sugars; increasing fiber and proteins.  - Advance exercise as able.     Mood disorder ([name])- Emotional eating Assessment & Plan: Moods are stable. Denies SI/HI. On Wellbutrin  SR 150 mg 1 tablet in the morning and 1 tablet in the afternoon with reported good compliance and tolerance. No complaints of emotional eating today.   - Continue Wellbutrin  therapy.  - Continue to practice mindfulness while eating.  - Continue implementation of medically supervised weight management plan which can support emotional well-being.     Chronic tension-type headache, not intractable Assessment & Plan: Condition controlled on Topiramate  50 mg BID. Denies any adverse SE. No complaints of headaches today. Good control of hunger and cravings.  - Continue Topiramate  at current dose.  - Continue PNP - Will continue to monitor regularly.     Vitamin D  deficiency Assessment & Plan: Lab Results  Component Value Date   VD25OH 56.2 11/24/2023   VD25OH 56.7 06/02/2023   VD25OH 67.6 10/04/2022   Currently on Ergocalciferol  50,000 units twice weekly without any adverse effects such as nausea, vomiting or muscle weakness. No acute concerns.   - Continue vitamin D  supplementation.  -  Recheck levels as deemed clinically necessary.     Follow up:   Return 03/05/2024 2:00 PM.  She was informed of the importance of frequent follow up visits to maximize her success with intensive lifestyle modifications for her multiple health  conditions.   Subjective:    Chief complaint: Obesity Molly Cherry is here to discuss her progress with her obesity treatment plan.She is on the Category 2 MP and journaling 1200-1400 calories and 85+ g of protein daily   and states she is following her eating plan approximately 70-80% of the time. She states she is doing strength training 30-45 minutes 2 days per week and swimming 30-45 minutes 2 days per week.   Interval History:  Molly Cherry is here for a follow up office visit. Since last OV on 12/26/2023 , she is up 1 lb. She has been traveling a lot since LOV to the beach, [place] and also for bible school. On certain days she did not eat on plan while traveling. She sometimes skips breakfast. She's ready to get more back on track.    Pharmacotherapy that aid with weight loss:  She is currently taking Wellbutrin  SR 150 mg twice daily, Metformin  500 mg twice daily, and Topiramate  50 mg BID.     Review of Systems:  Pertinent positives were addressed with patient today.  Reviewed by clinician on day of visit: allergies, medications, problem list, medical history, surgical history, family history, social history, and previous encounter notes.  Weight Summary and Biometrics   Weight Lost Since Last Visit: 0  Weight Gained Since Last Visit: 1lb   Vitals Temp: 98.5 F (36.9 C) BP: 123/85 Pulse Rate: (!) 119 SpO2: 98 %   Anthropometric Measurements Height: 5' 4 (1.626 m) Weight: 271 lb (122.9 kg) BMI (Calculated): 46.49 Weight at Last Visit: 270lb Weight Lost Since Last Visit: 0 Weight Gained Since Last Visit: 1lb Starting Weight: 277lb Total Weight Loss (lbs): 7 lb (3.175 kg)   Body Composition  Body Fat %: 47.9 % Fat Mass (lbs): 130 lbs Muscle Mass (lbs): 134.2 lbs Total Body Water (lbs): 102.2 lbs Visceral Fat Rating : 13   Other Clinical Data Fasting: no Labs: no Today's Visit #: 50 Starting Date: 11/12/20    Objective:   PHYSICAL EXAM: Blood  pressure 123/85, pulse (!) 119, temperature 98.5 F (36.9 C), height 5' 4 (1.626 m), weight 271 lb (122.9 kg), last menstrual period 01/10/2024, SpO2 98%. Body mass index is 46.52 kg/m.  General: she is overweight, cooperative and in no acute distress. PSYCH: Has normal mood, affect and thought process.   HEENT: EOMI, sclerae are anicteric. Lungs: Normal breathing effort, no conversational dyspnea. Extremities: Moves * 4 Neurologic: A and O * 3, good insight  DIAGNOSTIC DATA REVIEWED: BMET    Component Value Date/Time   NA 139 06/02/2023 1445   K 4.6 06/02/2023 1445   CL 106 06/02/2023 1445   CO2 20 06/02/2023 1445   GLUCOSE 87 06/02/2023 1445   GLUCOSE 117 (H) 09/21/2010 1808   BUN 11 06/02/2023 1445   CREATININE 0.84 06/02/2023 1445   CALCIUM 8.9 06/02/2023 1445   GFRNONAA NOT CALCULATED 09/21/2010 1808   GFRAA  09/21/2010 1808    NOT CALCULATED        The eGFR has been calculated using the MDRD equation. This calculation has not been validated in all clinical situations. eGFR's persistently <60 mL/min signify possible Chronic Kidney Disease.   Lab Results  Component Value Date   HGBA1C 5.4 11/24/2023  HGBA1C 5.4 11/12/2020   Lab Results  Component Value Date   INSULIN  14.0 11/24/2023   INSULIN  18.4 11/12/2020   Lab Results  Component Value Date   TSH 3.160 06/02/2023   CBC    Component Value Date/Time   WBC 8.4 04/28/2022 0849   WBC 18.1 (H) 09/21/2010 1808   RBC 4.66 04/28/2022 0849   RBC 4.87 09/21/2010 1808   HGB 12.8 04/28/2022 0849   HCT 39.4 04/28/2022 0849   PLT 266 04/28/2022 0849   MCV 85 04/28/2022 0849   MCH 27.5 04/28/2022 0849   MCH 28.5 09/21/2010 1808   MCHC 32.5 04/28/2022 0849   MCHC 35.0 09/21/2010 1808   RDW 13.3 04/28/2022 0849   Iron Studies No results found for: IRON, TIBC, FERRITIN, IRONPCTSAT Lipid Panel     Component Value Date/Time   CHOL 199 11/24/2023 1237   TRIG 157 (H) 11/24/2023 1237   HDL 68  11/24/2023 1237   CHOLHDL 2.9 11/24/2023 1237   LDLCALC 104 (H) 11/24/2023 1237   Hepatic Function Panel     Component Value Date/Time   PROT 6.9 06/02/2023 1445   ALBUMIN 4.0 06/02/2023 1445   AST 18 06/02/2023 1445   ALT 18 06/02/2023 1445   ALKPHOS 97 06/02/2023 1445   BILITOT <0.2 06/02/2023 1445      Component Value Date/Time   TSH 3.160 06/02/2023 1445   Nutritional Lab Results  Component Value Date   VD25OH 56.2 11/24/2023   VD25OH 56.7 06/02/2023   VD25OH 67.6 10/04/2022    Attestations:   I, Special Puri, acting as a Stage manager for [employer], DO., have compiled all relevant documentation for today's office visit on behalf of Molly Jenkins, DO, while in the presence of [employer], DO..   I have reviewed the above documentation for accuracy and completeness, and I agree with the above. Molly JINNY Cherry, D.O.  The 21st Century Cures Act was signed into law in 2016 which includes the topic of electronic health records.  This provides immediate access to information in MyChart. This includes consultation notes, operative notes, office notes, lab results and pathology reports.  If you have any questions about what you read please let us  know at your next visit so we can discuss your concerns and take corrective action if need be.  We are right here with you.

## 2024-02-08 ENCOUNTER — Other Ambulatory Visit: Payer: Self-pay | Admitting: Family Medicine

## 2024-02-08 DIAGNOSIS — N946 Dysmenorrhea, unspecified: Secondary | ICD-10-CM

## 2024-02-21 ENCOUNTER — Other Ambulatory Visit (INDEPENDENT_AMBULATORY_CARE_PROVIDER_SITE_OTHER): Payer: Self-pay | Admitting: Family Medicine

## 2024-02-21 DIAGNOSIS — E559 Vitamin D deficiency, unspecified: Secondary | ICD-10-CM

## 2024-02-23 ENCOUNTER — Ambulatory Visit (INDEPENDENT_AMBULATORY_CARE_PROVIDER_SITE_OTHER): Admitting: Family Medicine

## 2024-02-27 ENCOUNTER — Other Ambulatory Visit (INDEPENDENT_AMBULATORY_CARE_PROVIDER_SITE_OTHER): Payer: Self-pay | Admitting: Family Medicine

## 2024-02-27 DIAGNOSIS — J3089 Other allergic rhinitis: Secondary | ICD-10-CM

## 2024-02-27 DIAGNOSIS — F39 Unspecified mood [affective] disorder: Secondary | ICD-10-CM

## 2024-02-27 DIAGNOSIS — G44229 Chronic tension-type headache, not intractable: Secondary | ICD-10-CM

## 2024-03-05 ENCOUNTER — Encounter (INDEPENDENT_AMBULATORY_CARE_PROVIDER_SITE_OTHER): Payer: Self-pay | Admitting: Family Medicine

## 2024-03-05 ENCOUNTER — Ambulatory Visit (INDEPENDENT_AMBULATORY_CARE_PROVIDER_SITE_OTHER): Admitting: Family Medicine

## 2024-03-05 VITALS — BP 127/86 | HR 103 | Temp 98.2°F | Ht 64.0 in | Wt 274.0 lb

## 2024-03-05 DIAGNOSIS — J302 Other seasonal allergic rhinitis: Secondary | ICD-10-CM

## 2024-03-05 DIAGNOSIS — F5089 Other specified eating disorder: Secondary | ICD-10-CM

## 2024-03-05 DIAGNOSIS — R7303 Prediabetes: Secondary | ICD-10-CM

## 2024-03-05 DIAGNOSIS — F39 Unspecified mood [affective] disorder: Secondary | ICD-10-CM

## 2024-03-05 DIAGNOSIS — Z6841 Body Mass Index (BMI) 40.0 and over, adult: Secondary | ICD-10-CM

## 2024-03-05 DIAGNOSIS — G44229 Chronic tension-type headache, not intractable: Secondary | ICD-10-CM

## 2024-03-05 DIAGNOSIS — J3089 Other allergic rhinitis: Secondary | ICD-10-CM

## 2024-03-05 DIAGNOSIS — E559 Vitamin D deficiency, unspecified: Secondary | ICD-10-CM

## 2024-03-05 DIAGNOSIS — E669 Obesity, unspecified: Secondary | ICD-10-CM

## 2024-03-05 MED ORDER — METFORMIN HCL 500 MG PO TABS: ORAL_TABLET | ORAL | 0 refills | Status: DC

## 2024-03-05 MED ORDER — LEVOCETIRIZINE DIHYDROCHLORIDE 5 MG PO TABS: 5.0000 mg | ORAL_TABLET | Freq: Every evening | ORAL | 0 refills | Status: DC

## 2024-03-05 MED ORDER — BUPROPION HCL ER (SR) 150 MG PO TB12: ORAL_TABLET | ORAL | 0 refills | Status: DC

## 2024-03-05 MED ORDER — VITAMIN D (ERGOCALCIFEROL) 1.25 MG (50000 UNIT) PO CAPS: ORAL_CAPSULE | ORAL | 0 refills | Status: DC

## 2024-03-05 MED ORDER — TOPIRAMATE 50 MG PO TABS: ORAL_TABLET | ORAL | 0 refills | Status: DC

## 2024-03-05 MED ORDER — MONTELUKAST SODIUM 10 MG PO TABS: 10.0000 mg | ORAL_TABLET | Freq: Every day | ORAL | 0 refills | Status: DC

## 2024-03-05 NOTE — Progress Notes (Signed)
 Molly Cherry, D.O.  ABFM, ABOM Specializing in Clinical Bariatric Medicine  Office located at: [address] [place], [place]  [address]   Assessment and Plan:   Orders Placed This Encounter  Procedures   Hemoglobin A1c    Medications Discontinued During This Encounter  Medication Reason   levocetirizine (XYZAL ) 5 MG tablet Reorder   metFORMIN  (GLUCOPHAGE ) 500 MG tablet Reorder   montelukast  (SINGULAIR ) 10 MG tablet Reorder   topiramate  (TOPAMAX ) 50 MG tablet Reorder   Vitamin D , Ergocalciferol , (DRISDOL ) 1.25 MG (50000 UNIT) CAPS capsule Reorder   buPROPion  (WELLBUTRIN  SR) 150 MG 12 hr tablet Reorder     Meds ordered this encounter  Medications   buPROPion  (WELLBUTRIN  SR) 150 MG 12 hr tablet    Sig: 1 po q am and 1 po q late afternoon    Dispense:  180 tablet    Refill:  0    Ov for rf   levocetirizine (XYZAL ) 5 MG tablet    Sig: Take 1 tablet (5 mg total) by mouth every evening.    Dispense:  90 tablet    Refill:  0    90 d supply;  ** OV for RF **   Do not send RF request   metFORMIN  (GLUCOPHAGE ) 500 MG tablet    Sig: 1 po with lunch and dinner daily    Dispense:  180 tablet    Refill:  0    90 day supply;  ** OV for RF **   Do not send RF request   montelukast  (SINGULAIR ) 10 MG tablet    Sig: Take 1 tablet (10 mg total) by mouth at bedtime.    Dispense:  90 tablet    Refill:  0    90 day supply;  ** OV for RF **   Do not send RF request   topiramate  (TOPAMAX ) 50 MG tablet    Sig: one tab by mouth bid    Dispense:  180 tablet    Refill:  0   Vitamin D , Ergocalciferol , (DRISDOL ) 1.25 MG (50000 UNIT) CAPS capsule    Sig: 1 po q wed and 1 po q sun    Dispense:  24 capsule    Refill:  0    Please dispense 90 d supply.  ** OV for RF **   Do not send RF request     Consider obtaining repeat metabolism test in the near future.   FOR THE DISEASE OF OBESITY:  BMI 45.0-49.9, adult -- Current BMI 47.01 Obesity -START BMI 47.55 Assessment &  Plan: Since last office visit on 01/23/2024 patient's muscle mass has increased by 0.6 lbs. Fat mass has increased by 2.8 lbs. Total body water has increased by 1.8 lbs.  Body fat % has increased by 0.4 %. Counseling done on how various foods will affect these numbers and how to maximize success  Total lbs lost to date: -3 lbs Total weight loss percentage to date: -1.08 %   Recommended Dietary Goals Emerson is currently in the action stage of change. As such, her goal is to continue weight management plan.  She has agreed to: continue current plan   Behavioral Intervention We discussed the following today: different high protein low calorie breakfast ideas (e.g drinkable yogurts), eating multiple small meals throughout the day to get in all her meal plan foods, focusing on food with a 10:1 ratio of calories: grams of protein  Additional resources provided today: Handout on Common Characteristics of  Successful Weight Losers and Maintainers   Evidence-based interventions for health behavior change were utilized today including the discussion of self monitoring techniques, problem-solving barriers and SMART goal setting techniques.   Regarding patient's less desirable eating habits and patterns, we employed the technique of small changes.   Goal(s) for next OV: ***    Recommended Physical Activity Goals Louvina has been advised to work up to 300-450 minutes of moderate intensity aerobic activity a week and strengthening exercises 2-3 times per week for cardiovascular health, weight loss maintenance and preservation of muscle mass.   She was encouraged to do some form of exercise 30 minutes daily.   Pharmacotherapy Cont same regimen.   ASSOCIATED CONDITIONS ADDRESSED TODAY:   Mood disorder ([name])- Emotional eating Assessment & Plan: On Wellbutrin  SR 150 mg twice daily with reported good compliance and tolerance. Mood is controlled today; denies SI/HI. Since LOV, she endorses  emotional eating for a short period due to her pay checking getting reduced during one pay period. This stressor has resolved and she feels her emotional eating tendencies are overall well controlled.    Declines dose modification for Wellbutrin     Prediabetes Assessment & Plan: Lab Results  Component Value Date   HGBA1C 5.4 11/24/2023   HGBA1C 5.7 (H) 06/02/2023   HGBA1C 5.4 10/04/2022   INSULIN  14.0 11/24/2023   INSULIN  26.3 (H) 06/02/2023   INSULIN  22.1 10/04/2022    Pre-DM managed with Metformin  500 mg with lunch and dinner daily.  No complaints of excessive hunger and cravings today. Cont Metformin  and working on nutrition plan to decrease simple carbohydrates, increase lean proteins and exercise to promote weight loss and improve glycemic control and prevent progression to T2DM. Recheck Hemoglobin A1c today.     Chronic tension-type headache, not intractable Assessment & Plan: States her headaches and carb cravings are controlled on Topiramate  50 mg BID. She is tolerating this medicine well. No acute concerns. Cont Topiramate  (refill today) and PNP.     Environmental and seasonal allergies Assessment & Plan: Her allergy symptoms are controlled on Xyzal  5 mg daily and Singulair  10 mg daily. No acute concerns. Cont regimen (refill meds today) and low-inflammatory meal plan.    Vitamin D  deficiency Assessment & Plan: Lab Results  Component Value Date   VD25OH 56.2 11/24/2023   VD25OH 56.7 06/02/2023   VD25OH 67.6 10/04/2022   Currently on Ergocalciferol  50,000 units twice weekly without any adverse effects such as nausea, vomiting or muscle weakness. No acute concerns. Her most recent Vit D was at goal. Cont supplementation (refill today); recheck levels as deemed medically necessary.     Follow up:   No follow-ups on file.  She was informed of the importance of frequent follow up visits to maximize her success with intensive lifestyle modifications for her multiple  health conditions.  Naira Gajda is aware that we will review all of her lab results at our next visit together in person.  She is aware that if anything is critical/ life threatening with the results, we will be contacting her via MyChart or by my CMA will be calling them prior to the office visit to discuss acute management.    Subjective:    Chief complaint: Obesity Dwana is here to discuss her progress with her obesity treatment plan. She is on the Category 2 MP and journaling 1200-1400 calories and 85+ g of protein daily and states she is following her eating plan approximately 70% of the time. Pt is walking her dog  30 minutes 2 days per week.   Interval History:  Darlena Curro is here for a follow up office visit. Pt has experienced a weight gain of 3 lbs  since last OV on 12/01/2023. She attributes her weight gain to acute emotional eating secondary to financial stress (see Mood Disorder note above)  Her dietary/lifestyle habits include:  - Tracking Calories/Macros: no  - Meal Prepping: yes, on Mondays.   - Eating More Whole Foods: yes  - Adequate Protein Intake: no  - Adequate Water Intake: yes  - Skipping Meals: typically skips breakfast  - Sleeping 7-9 Hours/ Night: yes   Pharmacotherapy that aid with weight loss: She is currently taking Wellbutrin  SR 150 mg twice daily, Metformin  500 mg twice daily, and Topiramate  50 mg BID.      Review of Systems:  Pertinent positives were addressed with patient today.  Reviewed by clinician on day of visit: allergies, medications, problem list, medical history, surgical history, family history, social history, and previous encounter notes.  Weight Summary and Biometrics   Weight Lost Since Last Visit: 0  Weight Gained Since Last Visit: 3lb  ***  Vitals Temp: 98.2 F (36.8 C) BP: 127/86 Pulse Rate: (!) 103 SpO2: 97 %   Anthropometric Measurements Height: 5' 4 (1.626 m) Weight: 274 lb (124.3 kg) BMI  (Calculated): 47.01 Weight at Last Visit: 271lb Weight Lost Since Last Visit: 0 Weight Gained Since Last Visit: 3lb Starting Weight: 277lb Total Weight Loss (lbs): 4 lb (1.814 kg)   Body Composition  Body Fat %: 78.3 % Fat Mass (lbs): 132.8 lbs Muscle Mass (lbs): 134.8 lbs Total Body Water (lbs): 104 lbs Visceral Fat Rating : 13   Other Clinical Data Fasting: no Labs: no Today's Visit #: 51 Starting Date: 11/12/20    Objective:   PHYSICAL EXAM: Blood pressure 127/86, pulse (!) 103, temperature 98.2 F (36.8 C), height 5' 4 (1.626 m), weight 274 lb (124.3 kg), last menstrual period 03/04/2024, SpO2 97%. Body mass index is 47.03 kg/m.  General: she is overweight, cooperative and in no acute distress. PSYCH: Has normal mood, affect and thought process.   HEENT: EOMI, sclerae are anicteric. Lungs: Normal breathing effort, no conversational dyspnea. Extremities: Moves * 4 Neurologic: A and O * 3, good insight  DIAGNOSTIC DATA REVIEWED: BMET    Component Value Date/Time   NA 139 06/02/2023 1445   K 4.6 06/02/2023 1445   CL 106 06/02/2023 1445   CO2 20 06/02/2023 1445   GLUCOSE 87 06/02/2023 1445   GLUCOSE 117 (H) 09/21/2010 1808   BUN 11 06/02/2023 1445   CREATININE 0.84 06/02/2023 1445   CALCIUM 8.9 06/02/2023 1445   GFRNONAA NOT CALCULATED 09/21/2010 1808   GFRAA  09/21/2010 1808    NOT CALCULATED        The eGFR has been calculated using the MDRD equation. This calculation has not been validated in all clinical situations. eGFR's persistently <60 mL/min signify possible Chronic Kidney Disease.   Lab Results  Component Value Date   HGBA1C 5.4 11/24/2023   HGBA1C 5.4 11/12/2020   Lab Results  Component Value Date   INSULIN  14.0 11/24/2023   INSULIN  18.4 11/12/2020   Lab Results  Component Value Date   TSH 3.160 06/02/2023   CBC    Component Value Date/Time   WBC 8.4 04/28/2022 0849   WBC 18.1 (H) 09/21/2010 1808   RBC 4.66 04/28/2022 0849    RBC 4.87 09/21/2010 1808   HGB 12.8 04/28/2022 0849  HCT 39.4 04/28/2022 0849   PLT 266 04/28/2022 0849   MCV 85 04/28/2022 0849   MCH 27.5 04/28/2022 0849   MCH 28.5 09/21/2010 1808   MCHC 32.5 04/28/2022 0849   MCHC 35.0 09/21/2010 1808   RDW 13.3 04/28/2022 0849   Iron Studies No results found for: IRON, TIBC, FERRITIN, IRONPCTSAT Lipid Panel     Component Value Date/Time   CHOL 199 11/24/2023 1237   TRIG 157 (H) 11/24/2023 1237   HDL 68 11/24/2023 1237   CHOLHDL 2.9 11/24/2023 1237   LDLCALC 104 (H) 11/24/2023 1237   Hepatic Function Panel     Component Value Date/Time   PROT 6.9 06/02/2023 1445   ALBUMIN 4.0 06/02/2023 1445   AST 18 06/02/2023 1445   ALT 18 06/02/2023 1445   ALKPHOS 97 06/02/2023 1445   BILITOT <0.2 06/02/2023 1445      Component Value Date/Time   TSH 3.160 06/02/2023 1445   Nutritional Lab Results  Component Value Date   VD25OH 56.2 11/24/2023   VD25OH 56.7 06/02/2023   VD25OH 67.6 10/04/2022    Attestations:   I, ***, acting as a Stage manager for Molly Jenkins, DO., have compiled all relevant documentation for today's office visit on behalf of Molly Jenkins, DO, while in the presence of [employer], DO.  I have spent *** minutes in the care of the patient today including *** minutes face-to-face assessing and reviewing listed medical problems above as outlined in office visit note and providing nutritional and behavioral counseling as outlined in obesity care plan.   I have reviewed the above documentation for accuracy and completeness, and I agree with the above. Molly JINNY Cherry, D.O.  The 21st Century Cures Act was signed into law in 2016 which includes the topic of electronic health records.  This provides immediate access to information in MyChart.  This includes consultation notes, operative notes, office notes, lab results and pathology reports.  If you have any questions about what you read please let us  know  at your next visit so we can discuss your concerns and take corrective action if need be.  We are right here with you.

## 2024-03-06 LAB — HEMOGLOBIN A1C
Est. average glucose Bld gHb Est-mCnc: 103 mg/dL
Hgb A1c MFr Bld: 5.2 % (ref 4.8–5.6)

## 2024-04-02 ENCOUNTER — Ambulatory Visit (INDEPENDENT_AMBULATORY_CARE_PROVIDER_SITE_OTHER): Admitting: Family Medicine

## 2024-04-05 ENCOUNTER — Ambulatory Visit (INDEPENDENT_AMBULATORY_CARE_PROVIDER_SITE_OTHER): Admitting: Family Medicine

## 2024-04-05 ENCOUNTER — Encounter (INDEPENDENT_AMBULATORY_CARE_PROVIDER_SITE_OTHER): Payer: Self-pay | Admitting: Family Medicine

## 2024-04-05 VITALS — BP 139/81 | HR 95 | Temp 98.2°F | Ht 64.0 in | Wt 277.0 lb

## 2024-04-05 DIAGNOSIS — F5089 Other specified eating disorder: Secondary | ICD-10-CM | POA: Diagnosis not present

## 2024-04-05 DIAGNOSIS — Z6841 Body Mass Index (BMI) 40.0 and over, adult: Secondary | ICD-10-CM

## 2024-04-05 DIAGNOSIS — F39 Unspecified mood [affective] disorder: Secondary | ICD-10-CM

## 2024-04-05 DIAGNOSIS — E66813 Obesity, class 3: Secondary | ICD-10-CM

## 2024-04-05 DIAGNOSIS — E669 Obesity, unspecified: Secondary | ICD-10-CM

## 2024-04-05 DIAGNOSIS — R7303 Prediabetes: Secondary | ICD-10-CM | POA: Diagnosis not present

## 2024-04-05 DIAGNOSIS — G44229 Chronic tension-type headache, not intractable: Secondary | ICD-10-CM

## 2024-04-05 MED ORDER — PHENTERMINE HCL 8 MG PO TABS: 4.0000 mg | ORAL_TABLET | ORAL | 0 refills | Status: DC

## 2024-04-05 NOTE — Progress Notes (Signed)
 Molly Cherry, D.O.  ABFM, ABOM Specializing in Clinical Bariatric Medicine  Office located at: [address] [place], [place]  [address]      A) FOR THE CHRONIC DISEASE OF OBESITY:  Chief complaint: Obesity Molly Cherry is here to discuss her progress with her obesity treatment plan.   History of present illness / Interval history:  Molly Cherry is here today for her follow-up office visit.  Since last OV on 03/05/24, pt is up 3 lbs. Pt states that she doesn't think she did too well. She has been sick on and off for the last 2 weeks with the stomach big and flu. She thinks that it might be because she started working at an [employer].    03/05/24 13:00 04/05/24 14:00   Body Fat % 48.3 % 52 %  Muscle Mass (lbs) 134.8 lbs 126.4 lbs  Fat Mass (lbs) 132.8 lbs 144.4 lbs  Total Body Water (lbs) 104 lbs 99 lbs  Visceral Fat Rating  13 15    Counseling done on how various foods will affect these numbers and how to maximize success   Total lbs lost to date: 0 lbs Total Fat Mass in lbs lost to date: + 3.2 lbs Total weight loss percentage to date: 0 %    Obesity -START BMI 47.55 BMI 45.0-49.9, adult -- Current BMI 47.52  Nutrition Therapy She is journaling 1200-1400 calories and 85+ g of protein daily with Cat 2 as guide and states she is following her eating plan approximately 60 % of the time.   - Tracking Calories/Macros: yes, every other day   - Eating More Whole Foods: no   - Adequate Protein Intake: no   - Adequate Water Intake: yes  - Skipping Meals: yes  - Sleeping 7-9 Hours/ Night: yes   Molly Cherry is currently in the action stage of change. As such, her goal is to continue weight management plan.  She has agreed to: continue current plan   Physical Activity [name] is walking 30  minutes 1 days per week   [name] has been advised to work up to 300-450 minutes of moderate intensity aerobic activity a week and strengthening exercises 2-3  times per week for cardiovascular health, weight loss maintenance and preservation of muscle mass.  She has agreed to : Increase physical activity in their day and reduce sedentary time (increase NEAT)., Increase volume of physical activity to a goal of 240 minutes a week, and Combine aerobic and strengthening exercises for efficiency and improved cardiometabolic health.   Behavioral Modifications Evidence-based interventions for health behavior change were utilized today including the discussion of  1) self monitoring techniques:    - Avoid skipping meals  2) problem-solving barriers:    3) self care:    - Put yourself first  4) SMART goals for next OV:    - Exercise 30 min 3 days a week  Regarding patient's less desirable eating habits and patterns, we employed the technique of small changes.   We discussed the following today: increasing lean protein intake to established goals, avoiding skipping meals, increasing water intake , and planning for success Additional resources provided today: None   Medical Interventions/ Pharmacotherapy Previous Bariatric surgery: n/a Pharmacotherapy for weight loss: She is currently taking Wellbutrin  SR 150 mg twice daily, Metformin  500 mg twice daily and Topiramate  50 mg BID for medical weight loss.    We discussed various medication options to help Molly Cherry with her weight loss efforts and we  both agreed to : Continue with current nutritional and behavioral strategies  Pt has been working really hard and following meal plan closely but has not seen a lot of benefits. In order to help her and see more benefits explained to pt how Phentermine could help. Explained the risk and benefits to pt. Explained to pt how she could not get pregnant on this medication. Mutually agreed to Begin Phentermine 4 mg in the AM. Controlled substance agreement was reviewed and explained in office and was signed by pt. PDMP was reviewed in office. Pt denies any seizure  history. Pt verbally agrees to understanding the effects of phentermine. Will reassess at next OV.     B) OBESITY RELATED CONDITIONS ADDRESSED TODAY:  Mood disorder ([name])- Emotional eating Assessment & Plan Wellbutrin  SR 150 mg BID. Good compliance and tolerance. Pt states that her mood has been well controlled. Pt denies any SI/HI    Prediabetes Assessment & Plan Lab Results  Component Value Date   HGBA1C 5.2 03/05/2024   HGBA1C 5.4 11/24/2023   HGBA1C 5.7 (H) 06/02/2023   INSULIN  14.0 11/24/2023   INSULIN  26.3 (H) 06/02/2023   INSULIN  22.1 10/04/2022    Metformin  500 mg BID. No acute concerns today. Pt does not report any excessive hunger or cravings. Continue with medications and following nutritional meal plan. Decrease simple carbs and sugars. A1C has consisently decreased and improving.      Chronic tension-type headache, not intractable Assessment & Plan On Topiramate  50 mg BID. Good compliance and tolerance. Pt does not have any concerns today. Continue with medications. Will reevaluate at next OV.     Meds ordered this encounter  Medications   Phentermine HCl 8 MG TABS    Sig: Take 0.5 tablets (4 mg total) by mouth every morning.    Dispense:  15 tablet    Refill:  0      Follow up:   Return 04/30/2024 at 2:00 PM   She was informed of the importance of frequent follow up visits to maximize her success with intensive lifestyle modifications for her multiple health conditions.   Weight Summary and Biometrics   Weight Lost Since Last Visit: 0lb  Weight Gained Since Last Visit: 3lb    Vitals Temp: 98.2 F (36.8 C) BP: 139/81 Pulse Rate: 95 SpO2: 98 %   Anthropometric Measurements Height: 5' 4 (1.626 m) Weight: 277 lb (125.6 kg) BMI (Calculated): 47.52 Weight at Last Visit: 274lb Weight Lost Since Last Visit: 0lb Weight Gained Since Last Visit: 3lb Starting Weight: 277lb Total Weight Loss (lbs): 1 lb (0.454 kg)   Body Composition  Body Fat  %: 52 % Fat Mass (lbs): 144.4 lbs Muscle Mass (lbs): 126.4 lbs Total Body Water (lbs): 99 lbs Visceral Fat Rating : 15   Other Clinical Data Fasting: no Labs: no Today's Visit #: 52 Starting Date: 11/12/20    Objective:   PHYSICAL EXAM: Blood pressure 139/81, pulse 95, temperature 98.2 F (36.8 C), height 5' 4 (1.626 m), weight 277 lb (125.6 kg), last menstrual period 03/05/2024, SpO2 98%. Body mass index is 47.55 kg/m.  General: she is overweight, cooperative and in no acute distress. PSYCH: Has normal mood, affect and thought process.   HEENT: EOMI, sclerae are anicteric. Lungs: Normal breathing effort, no conversational dyspnea. Extremities: Moves * 4 Neurologic: A and O * 3, good insight  DIAGNOSTIC DATA REVIEWED: BMET    Component Value Date/Time   NA 139 06/02/2023 1445   K 4.6 06/02/2023 1445  CL 106 06/02/2023 1445   CO2 20 06/02/2023 1445   GLUCOSE 87 06/02/2023 1445   GLUCOSE 117 (H) 09/21/2010 1808   BUN 11 06/02/2023 1445   CREATININE 0.84 06/02/2023 1445   CALCIUM 8.9 06/02/2023 1445   GFRNONAA NOT CALCULATED 09/21/2010 1808   GFRAA  09/21/2010 1808    NOT CALCULATED        The eGFR has been calculated using the MDRD equation. This calculation has not been validated in all clinical situations. eGFR's persistently <60 mL/min signify possible Chronic Kidney Disease.   Lab Results  Component Value Date   HGBA1C 5.2 03/05/2024   HGBA1C 5.4 11/12/2020   Lab Results  Component Value Date   INSULIN  14.0 11/24/2023   INSULIN  18.4 11/12/2020   Lab Results  Component Value Date   TSH 3.160 06/02/2023   CBC    Component Value Date/Time   WBC 8.4 04/28/2022 0849   WBC 18.1 (H) 09/21/2010 1808   RBC 4.66 04/28/2022 0849   RBC 4.87 09/21/2010 1808   HGB 12.8 04/28/2022 0849   HCT 39.4 04/28/2022 0849   PLT 266 04/28/2022 0849   MCV 85 04/28/2022 0849   MCH 27.5 04/28/2022 0849   MCH 28.5 09/21/2010 1808   MCHC 32.5 04/28/2022 0849    MCHC 35.0 09/21/2010 1808   RDW 13.3 04/28/2022 0849   Iron Studies No results found for: IRON, TIBC, FERRITIN, IRONPCTSAT Lipid Panel     Component Value Date/Time   CHOL 199 11/24/2023 1237   TRIG 157 (H) 11/24/2023 1237   HDL 68 11/24/2023 1237   CHOLHDL 2.9 11/24/2023 1237   LDLCALC 104 (H) 11/24/2023 1237   Hepatic Function Panel     Component Value Date/Time   PROT 6.9 06/02/2023 1445   ALBUMIN 4.0 06/02/2023 1445   AST 18 06/02/2023 1445   ALT 18 06/02/2023 1445   ALKPHOS 97 06/02/2023 1445   BILITOT <0.2 06/02/2023 1445      Component Value Date/Time   TSH 3.160 06/02/2023 1445   Nutritional Lab Results  Component Value Date   VD25OH 56.2 11/24/2023   VD25OH 56.7 06/02/2023   VD25OH 67.6 10/04/2022    Attestations:   I, Sonny Laroche, acting as a Stage manager for Molly Jenkins, DO., have compiled all relevant documentation for today's office visit on behalf of Molly Jenkins, DO, while in the presence of [employer], DO.   I have reviewed the above documentation for accuracy and completeness, and I agree with the above. Molly JINNY Cherry, D.O.  The 21st Century Cures Act was signed into law in 2016 which includes the topic of electronic health records.  This provides immediate access to information in MyChart.  This includes consultation notes, operative notes, office notes, lab results and pathology reports.  If you have any questions about what you read please let us  know at your next visit so we can discuss your concerns and take corrective action if need be.  We are right here with you.

## 2024-04-30 ENCOUNTER — Encounter (INDEPENDENT_AMBULATORY_CARE_PROVIDER_SITE_OTHER): Payer: Self-pay | Admitting: Family Medicine

## 2024-04-30 ENCOUNTER — Ambulatory Visit (INDEPENDENT_AMBULATORY_CARE_PROVIDER_SITE_OTHER): Payer: Self-pay | Admitting: Family Medicine

## 2024-04-30 VITALS — BP 131/85 | HR 100 | Temp 98.4°F | Ht 64.0 in | Wt 276.0 lb

## 2024-04-30 DIAGNOSIS — Z6841 Body Mass Index (BMI) 40.0 and over, adult: Secondary | ICD-10-CM

## 2024-04-30 DIAGNOSIS — R7303 Prediabetes: Secondary | ICD-10-CM | POA: Diagnosis not present

## 2024-04-30 DIAGNOSIS — E66813 Obesity, class 3: Secondary | ICD-10-CM

## 2024-04-30 DIAGNOSIS — G44229 Chronic tension-type headache, not intractable: Secondary | ICD-10-CM | POA: Diagnosis not present

## 2024-04-30 DIAGNOSIS — F5089 Other specified eating disorder: Secondary | ICD-10-CM

## 2024-04-30 DIAGNOSIS — F39 Unspecified mood [affective] disorder: Secondary | ICD-10-CM | POA: Diagnosis not present

## 2024-04-30 DIAGNOSIS — E559 Vitamin D deficiency, unspecified: Secondary | ICD-10-CM

## 2024-04-30 MED ORDER — PHENTERMINE HCL 8 MG PO TABS: 4.0000 mg | ORAL_TABLET | ORAL | 0 refills | Status: DC

## 2024-04-30 NOTE — Progress Notes (Signed)
 [name], D.O.  ABFM, ABOM Specializing in Clinical Bariatric Medicine  Office located at: [address] [place], [place]  [address]      A) FOR THE CHRONIC DISEASE OF OBESITY:  Chief complaint: Obesity Molly Cherry is here to discuss her progress with her obesity treatment plan.    History of present illness / Interval history:  Molly Cherry is here today for her follow-up office visit.  Since last OV on 04/05/2024, pt is down 1lb.    04/05/24 14:00 04/30/24 13:00   Body Fat % 52 % 51.9 %  Muscle Mass (lbs) 126.4 lbs 126.2 lbs  Fat Mass (lbs) 144.4 lbs 143.4 lbs  Total Body Water (lbs) 99 lbs 98.2 lbs  Visceral Fat Rating  15 15  Counseling done on how various foods will affect these numbers and how to maximize success   Total lbs lost to date: -1 lbs Total Fat Mass in lbs lost to date: +2.2 Total weight loss percentage to date: -0.36 %   Nutrition Therapy She is journaling 1200-1400 calories and 85+ g of protein daily with Cat 2 as guide and states she is following her eating plan approximately 75 % of the time.   - Tracking Calories/Macros: no -    - Eating More Whole Foods: yes  - Adequate Protein Intake: no -    - Adequate Water Intake: yes  - Skipping Meals: yes  - Sleeping 7-9 Hours/ Night: yes   Molly Cherry is currently in the action stage of change. As such, her goal is to continue weight management plan.  She has agreed to: continue current plan   Physical Activity Pt  is walking 35-45  minutes 2 days per week   [name] has been advised to work up to 300-450 minutes of moderate intensity aerobic activity a week and strengthening exercises 2-3 times per week for cardiovascular health, weight loss maintenance and preservation of muscle mass.  She has agreed to : Think about enjoyable ways to increase daily physical activity and overcoming barriers to exercise, Increase physical activity in their day and reduce sedentary time (increase  NEAT)., Increase volume of physical activity to a goal of 240 minutes a week, and Combine aerobic and strengthening exercises for efficiency and improved cardiometabolic health.   Behavioral Modifications Evidence-based interventions for health behavior change were utilized today including the discussion of  1) self monitoring techniques:  journaling, measuring, avoid skipping meals 2) problem-solving barriers:  Financial barriers to buying whole foods and protein. 3) self care:  Exercise 4) SMART goals for next OV:  increase exercise to at least 3 days a week.  Regarding patient's less desirable eating habits and patterns, we employed the technique of small changes.   We discussed the following today: avoiding skipping meals, increasing water intake , work on meal planning and preparation, work on tracking and journaling calories using tracking application, work on managing stress, creating time for self-care and relaxation, continue to work on implementation of reduced calorie nutritional plan, continue to practice mindfulness when eating, and discussed pre-packaged healthier meals such as [employer], Whole 30 chicken meals, Longlife meal prep and [employer] Meals etc Additional resources provided today: None   Medical Interventions/ Pharmacotherapy Previous Bariatric surgery: none Pharmacotherapy for weight loss: She is currently taking Wellbutrin  SR 150 mg twice daily, Metformin  500 mg twice daily, Phentermine 4 mg daily, and Topiramate  50 mg daily for medical weight loss.    Started phentermine 4 mg at LOV. With  the medication she reports having more energy. Hunger and cravings have been well controlled. Cont medication (refill today). Checked PDMP and no discrepancies.  We discussed various medication options to help [place]avia with her weight loss efforts and we both agreed to : Continue with current nutritional and behavioral strategies   B) OBESITY RELATED CONDITIONS ADDRESSED  TODAY:  Obesity -START BMI 47.55 BMI 45.0-49.9, adult -- Current BMI 47.35   Prediabetes Assessment & Plan Lab Results  Component Value Date   HGBA1C 5.2 03/05/2024   HGBA1C 5.4 11/24/2023   HGBA1C 5.7 (H) 06/02/2023   INSULIN  14.0 11/24/2023   INSULIN  26.3 (H) 06/02/2023   INSULIN  22.1 10/04/2022  Pre-Dm managed by Metformin  500 mg twice daily with good compliance and tolerance. Hunger and cravings are well controlled. A1c is at goal. No acute concerns. Cont decreasing simple carbs/sugars and increasing lean proteins. Increase exercise as able. Cont medication.     Mood disorder ([name])- Emotional eating Assessment & Plan Currently on Wellbutrin  SR 150 mg twice daily. Mood is stable. No acute concerns today. Encouraged to obtain adequate sleep, increase exercise as able, and find self-care strategies to help manage stress and aid in weight loss. Cont medication    Chronic tension-type headache, not intractable Assessment & Plan On Topiramate  50 mg twice daily with good compliance and tolerance. Pt asx. No acute concerns today. Cont medication.       Medications Discontinued During This Encounter  Medication Reason   Phentermine HCl 8 MG TABS Reorder     Meds ordered this encounter  Medications   Phentermine HCl 8 MG TABS    Sig: Take 0.5 tablets (4 mg total) by mouth every morning.    Dispense:  15 tablet    Refill:  0      Follow up:   Return 05/28/2024 2:20 PM.  She was informed of the importance of frequent follow up visits to maximize her success with intensive lifestyle modifications for her multiple health conditions.   Weight Summary and Biometrics   Weight Lost Since Last Visit: 1lb  Weight Gained Since Last Visit: 0lb    Vitals Temp: 98.4 F (36.9 C) BP: 131/85 Pulse Rate: 100 SpO2: 97 %   Anthropometric Measurements Height: 5' 4 (1.626 m) Weight: 276 lb (125.2 kg) BMI (Calculated): 47.35 Weight at Last Visit: 277lb Weight Lost Since  Last Visit: 1lb Weight Gained Since Last Visit: 0lb Starting Weight: 277lb Total Weight Loss (lbs): 2 lb (0.907 kg)   Body Composition  Body Fat %: 51.9 % Fat Mass (lbs): 143.4 lbs Muscle Mass (lbs): 126.2 lbs Total Body Water (lbs): 98.2 lbs Visceral Fat Rating : 15   Other Clinical Data Fasting: no Labs: no Today's Visit #: 53 Starting Date: 11/12/20    Objective:   PHYSICAL EXAM: Blood pressure 131/85, pulse 100, temperature 98.4 F (36.9 C), height 5' 4 (1.626 m), weight 276 lb (125.2 kg), last menstrual period 04/16/2024, SpO2 97%. Body mass index is 47.38 kg/m.  General: she is overweight, cooperative and in no acute distress. PSYCH: Has normal mood, affect and thought process.   HEENT: EOMI, sclerae are anicteric. Lungs: Normal breathing effort, no conversational dyspnea. Extremities: Moves * 4 Neurologic: A and O * 3, good insight  DIAGNOSTIC DATA REVIEWED: BMET    Component Value Date/Time   NA 139 06/02/2023 1445   K 4.6 06/02/2023 1445   CL 106 06/02/2023 1445   CO2 20 06/02/2023 1445   GLUCOSE 87 06/02/2023 1445  GLUCOSE 117 (H) 09/21/2010 1808   BUN 11 06/02/2023 1445   CREATININE 0.84 06/02/2023 1445   CALCIUM 8.9 06/02/2023 1445   GFRNONAA NOT CALCULATED 09/21/2010 1808   GFRAA  09/21/2010 1808    NOT CALCULATED        The eGFR has been calculated using the MDRD equation. This calculation has not been validated in all clinical situations. eGFR's persistently <60 mL/min signify possible Chronic Kidney Disease.   Lab Results  Component Value Date   HGBA1C 5.2 03/05/2024   HGBA1C 5.4 11/12/2020   Lab Results  Component Value Date   INSULIN  14.0 11/24/2023   INSULIN  18.4 11/12/2020   Lab Results  Component Value Date   TSH 3.160 06/02/2023   CBC    Component Value Date/Time   WBC 8.4 04/28/2022 0849   WBC 18.1 (H) 09/21/2010 1808   RBC 4.66 04/28/2022 0849   RBC 4.87 09/21/2010 1808   HGB 12.8 04/28/2022 0849   HCT 39.4  04/28/2022 0849   PLT 266 04/28/2022 0849   MCV 85 04/28/2022 0849   MCH 27.5 04/28/2022 0849   MCH 28.5 09/21/2010 1808   MCHC 32.5 04/28/2022 0849   MCHC 35.0 09/21/2010 1808   RDW 13.3 04/28/2022 0849   Iron Studies No results found for: IRON, TIBC, FERRITIN, IRONPCTSAT Lipid Panel     Component Value Date/Time   CHOL 199 11/24/2023 1237   TRIG 157 (H) 11/24/2023 1237   HDL 68 11/24/2023 1237   CHOLHDL 2.9 11/24/2023 1237   LDLCALC 104 (H) 11/24/2023 1237   Hepatic Function Panel     Component Value Date/Time   PROT 6.9 06/02/2023 1445   ALBUMIN 4.0 06/02/2023 1445   AST 18 06/02/2023 1445   ALT 18 06/02/2023 1445   ALKPHOS 97 06/02/2023 1445   BILITOT <0.2 06/02/2023 1445      Component Value Date/Time   TSH 3.160 06/02/2023 1445   Nutritional Lab Results  Component Value Date   VD25OH 56.2 11/24/2023   VD25OH 56.7 06/02/2023   VD25OH 67.6 10/04/2022    Attestations:   I, Feliciano Mingle, acting as a stage manager for [employer], DO., have compiled all relevant documentation for today's office visit on behalf of Barnie Jenkins, DO, while in the presence of [employer], DO.   I have reviewed the above documentation for accuracy and completeness, and I agree with the above. Barnie JINNY Jenkins, D.O.  The 21st Century Cures Act was signed into law in 2016 which includes the topic of electronic health records.  This provides immediate access to information in MyChart.  This includes consultation notes, operative notes, office notes, lab results and pathology reports.  If you have any questions about what you read please let us  know at your next visit so we can discuss your concerns and take corrective action if need be.  We are right here with you.

## 2024-05-27 ENCOUNTER — Encounter (INDEPENDENT_AMBULATORY_CARE_PROVIDER_SITE_OTHER): Payer: Self-pay | Admitting: Family Medicine

## 2024-05-28 ENCOUNTER — Ambulatory Visit (INDEPENDENT_AMBULATORY_CARE_PROVIDER_SITE_OTHER): Payer: Self-pay | Admitting: Family Medicine

## 2024-06-17 ENCOUNTER — Other Ambulatory Visit (INDEPENDENT_AMBULATORY_CARE_PROVIDER_SITE_OTHER): Payer: Self-pay | Admitting: Family Medicine

## 2024-06-17 DIAGNOSIS — E559 Vitamin D deficiency, unspecified: Secondary | ICD-10-CM

## 2024-06-22 ENCOUNTER — Other Ambulatory Visit (INDEPENDENT_AMBULATORY_CARE_PROVIDER_SITE_OTHER): Payer: Self-pay | Admitting: Family Medicine

## 2024-06-22 DIAGNOSIS — J3089 Other allergic rhinitis: Secondary | ICD-10-CM

## 2024-06-23 ENCOUNTER — Other Ambulatory Visit (INDEPENDENT_AMBULATORY_CARE_PROVIDER_SITE_OTHER): Payer: Self-pay | Admitting: Family Medicine

## 2024-06-23 DIAGNOSIS — G44229 Chronic tension-type headache, not intractable: Secondary | ICD-10-CM

## 2024-06-23 DIAGNOSIS — R7303 Prediabetes: Secondary | ICD-10-CM

## 2024-06-23 DIAGNOSIS — F39 Unspecified mood [affective] disorder: Secondary | ICD-10-CM

## 2024-06-23 DIAGNOSIS — J3089 Other allergic rhinitis: Secondary | ICD-10-CM

## 2024-06-25 ENCOUNTER — Ambulatory Visit (INDEPENDENT_AMBULATORY_CARE_PROVIDER_SITE_OTHER): Admitting: Family Medicine

## 2024-06-25 ENCOUNTER — Telehealth (INDEPENDENT_AMBULATORY_CARE_PROVIDER_SITE_OTHER): Payer: Self-pay

## 2024-06-25 ENCOUNTER — Encounter (INDEPENDENT_AMBULATORY_CARE_PROVIDER_SITE_OTHER): Payer: Self-pay | Admitting: Family Medicine

## 2024-06-25 VITALS — BP 135/81 | HR 130 | Temp 98.2°F | Ht 64.0 in | Wt 286.0 lb

## 2024-06-25 DIAGNOSIS — J3089 Other allergic rhinitis: Secondary | ICD-10-CM | POA: Diagnosis not present

## 2024-06-25 DIAGNOSIS — F39 Unspecified mood [affective] disorder: Secondary | ICD-10-CM

## 2024-06-25 DIAGNOSIS — E559 Vitamin D deficiency, unspecified: Secondary | ICD-10-CM

## 2024-06-25 DIAGNOSIS — R030 Elevated blood-pressure reading, without diagnosis of hypertension: Secondary | ICD-10-CM

## 2024-06-25 DIAGNOSIS — F5089 Other specified eating disorder: Secondary | ICD-10-CM | POA: Diagnosis not present

## 2024-06-25 DIAGNOSIS — E66813 Obesity, class 3: Secondary | ICD-10-CM

## 2024-06-25 DIAGNOSIS — R7303 Prediabetes: Secondary | ICD-10-CM

## 2024-06-25 DIAGNOSIS — Z6841 Body Mass Index (BMI) 40.0 and over, adult: Secondary | ICD-10-CM | POA: Diagnosis not present

## 2024-06-25 DIAGNOSIS — J302 Other seasonal allergic rhinitis: Secondary | ICD-10-CM

## 2024-06-25 DIAGNOSIS — E669 Obesity, unspecified: Secondary | ICD-10-CM

## 2024-06-25 DIAGNOSIS — G44229 Chronic tension-type headache, not intractable: Secondary | ICD-10-CM | POA: Diagnosis not present

## 2024-06-25 MED ORDER — TOPIRAMATE 50 MG PO TABS: ORAL_TABLET | ORAL | 0 refills | Status: AC

## 2024-06-25 MED ORDER — LEVOCETIRIZINE DIHYDROCHLORIDE 5 MG PO TABS: 5.0000 mg | ORAL_TABLET | Freq: Every evening | ORAL | 0 refills | Status: AC

## 2024-06-25 MED ORDER — VITAMIN D (ERGOCALCIFEROL) 50000 UNITS PO CAPS: ORAL_CAPSULE | ORAL | 1 refills | Status: AC

## 2024-06-25 MED ORDER — PHENTERMINE HCL 8 MG PO TABS: 4.0000 mg | ORAL_TABLET | ORAL | 0 refills | Status: DC

## 2024-06-25 MED ORDER — BUPROPION HCL ER (SR) 150 MG PO TB12: ORAL_TABLET | ORAL | 0 refills | Status: AC

## 2024-06-25 MED ORDER — METFORMIN HCL 500 MG PO TABS: ORAL_TABLET | ORAL | 0 refills | Status: AC

## 2024-06-25 MED ORDER — MONTELUKAST SODIUM 10 MG PO TABS: 10.0000 mg | ORAL_TABLET | Freq: Every day | ORAL | 0 refills | Status: AC

## 2024-06-25 NOTE — Telephone Encounter (Signed)
 PA for Lomaira  8 MG has been submitted, awaiting PA questions.

## 2024-06-25 NOTE — Progress Notes (Signed)
 "  Molly Cherry, D.O.  ABFM, ABOM Specializing in Clinical Bariatric Medicine  Office located at: [address] [place], [place]  [address]      A) FOR THE CHRONIC DISEASE OF OBESITY:  Obesity -START BMI 47.55 BMI 45.0-49.9, adult -- Current BMI 49.07  Chief complaint: Obesity Molly Cherry is here to discuss her progress with her obesity treatment plan.   History of present illness / Interval history:  Molly Cherry is here today for her follow-up office visit.  Since last OV on 04/30/2024, pt is up 10 lbs.   Had a heart rate of 115 as pt was visibly upset due to her weight gain.   04/30/24 13:00 06/25/24 14:00   Body Fat % 51.9 % 53.3 %  Muscle Mass (lbs) 126.2 lbs 126.8 lbs  Fat Mass (lbs) 143.4 lbs 152.4 lbs  Total Body Water (lbs) 98.2 lbs 100.2 lbs  Visceral Fat Rating  15 16  Counseling done on how various foods will affect these numbers and how to maximize success   Total lbs lost to date: +9 lbs Total Fat Mass in lbs lost to date: +11.2 Total weight loss percentage to date: +3.25 %   Nutrition Therapy She is  journaling 1200-1400 calories and 85+ g of protein daily with Cat 2 as guide and states she is following her eating plan approximately 70 % of the time.   - Tracking Calories/Macros: no - ***  - Eating More Whole Foods: yes  - Adequate Protein Intake: no - ***  - Adequate Water Intake: no - ***  - Skipping Meals: yes  - Sleeping 7-9 Hours/ Night: yes   Molly Cherry is currently in the action stage of change. As such, her goal is to continue weight management plan.  She has agreed to: continue current plan   Physical Activity Pt is not exercising.   [name] has been advised to work up to 300-450 minutes of moderate intensity aerobic activity a week and strengthening exercises 2-3 times per week for cardiovascular health, weight loss maintenance and preservation of muscle mass.  She has agreed to : Think about enjoyable ways to increase  daily physical activity and overcoming barriers to exercise, Increase physical activity in their day and reduce sedentary time (increase NEAT)., Increase volume of physical activity to a goal of 240 minutes a week, and Combine aerobic and strengthening exercises for efficiency and improved cardiometabolic health.   Behavioral Modifications Evidence-based interventions for health behavior change were utilized today including the discussion of  1) self monitoring techniques:  tracking caloric and protein intake 2) problem-solving barriers:  n/a 3) self care:  exercise 4) SMART goals for next OV:  Call new insurance and ask if they cover anti-obesity medications and journal intake Regarding patient's less desirable eating habits and patterns, we employed the technique of small changes.   We discussed the following today: increasing lean protein intake to established goals, decreasing simple carbohydrates , keeping healthy foods at home, continue to work on implementation of reduced calorie nutritional plan, and continue to practice mindfulness when eating Additional resources provided today: Handout on Daily Food Journaling Log   Medical Interventions/ Pharmacotherapy Previous Bariatric surgery: none  Surgical referral to  Dr. Odis Blush and pt agreed to a consultation to learn more about this treatment option. This would be in addition to her dietary and lifestyle changes.   Pharmacotherapy for weight loss: She is currently taking Wellbutrin  SR 150 mg twice daily, Metformin  500  mg twice  daily, Phentermine  4 mg daily, and Topiramate  50 mg daily for medical weight loss.    Started phentermine  around Dec 6th and has been compliant since. Noticed reduced hunger. Denies side effects. Cont medication. PDMP reviewed and no discrepancies found; refills provided.   We discussed various medication options to help Madine with her weight loss efforts and we both agreed to : Continue with current  nutritional and behavioral strategies   B) OBESITY RELATED CONDITIONS ADDRESSED TODAY:  RECHECK A1c, VIT D, CBC, THYROID, CMP, AND B12 TODAY.  Prediabetes Assessment & Plan Lab Results  Component Value Date   HGBA1C 5.2 03/05/2024   HGBA1C 5.4 11/24/2023   HGBA1C 5.7 (H) 06/02/2023   INSULIN  14.0 11/24/2023   INSULIN  26.3 (H) 06/02/2023   INSULIN  22.1 10/04/2022  Pre-DM managed by Metformin  500 mg twice daily with good compliance and tolerance. Referral to bariatric surgery. A1c is at goal. Recheck A1c. Cont medication. Cont decreasing simple carbs/sugars and increasing lean proteins. Increase exercise as able.     Mood disorder ([name])- Emotional eating Assessment & Plan Currently on Wellbutrin  SR 150 mg twice daily with good compliance and tolerance. Pt was visibly upset today due to weight gain, but overall mood has been well controlled. Encouraged to implement self-care strategies to help manage stress and aid in weight loss. Increase exercise as able to help manage stress. Cont adherence to medication.     Chronic tension-type headache, not intractable Assessment & Plan Managed by Topiramate  50 mg twice daily with good compliance and tolerance. Reports she only experiences headaches when on her menstrual cycle. Overall headaches are well controlled. No acute concerns today. Cont adherence to medication. Will cont to monitor.     Environmental and seasonal allergies Assessment & Plan Managed by Xyzal  5 mg once daily and singulair  10 mg once daily with good compliance and tolerance. Allergies are well controlled. No acute concerns. Cont medication as needed.     Vitamin D  deficiency Assessment & Plan Lab Results  Component Value Date   VD25OH 56.2 11/24/2023   VD25OH 56.7 06/02/2023   VD25OH 67.6 10/04/2022  Currently on Ergo 50K units twice weekly with good compliance and tolerance. Vit D levels are at goal. No acute concerns today. Cont regimen(refill today). Recheck  levels today.     Medications Discontinued During This Encounter  Medication Reason   buPROPion  (WELLBUTRIN  SR) 150 MG 12 hr tablet Reorder   levocetirizine (XYZAL ) 5 MG tablet Reorder   metFORMIN  (GLUCOPHAGE ) 500 MG tablet Reorder   montelukast  (SINGULAIR ) 10 MG tablet Reorder   topiramate  (TOPAMAX ) 50 MG tablet Reorder   Vitamin D , Ergocalciferol , (DRISDOL ) 1.25 MG (50000 UNIT) CAPS capsule Reorder   Phentermine  HCl 8 MG TABS Reorder     Meds ordered this encounter  Medications   levocetirizine (XYZAL ) 5 MG tablet    Sig: Take 1 tablet (5 mg total) by mouth every evening.    Dispense:  90 tablet    Refill:  0    90 d supply;  ** OV for RF **   Do not send RF request   montelukast  (SINGULAIR ) 10 MG tablet    Sig: Take 1 tablet (10 mg total) by mouth at bedtime.    Dispense:  90 tablet    Refill:  0    90 day supply;  ** OV for RF **   Do not send RF request   metFORMIN  (GLUCOPHAGE ) 500 MG tablet    Sig: 1 po with lunch and dinner  daily    Dispense:  180 tablet    Refill:  0    90 day supply;  ** OV for RF **   Do not send RF request   Phentermine  HCl 8 MG TABS    Sig: Take 0.5 tablets (4 mg total) by mouth every morning.    Dispense:  15 tablet    Refill:  0   topiramate  (TOPAMAX ) 50 MG tablet    Sig: one tab by mouth bid    Dispense:  180 tablet    Refill:  0   Vitamin D , Ergocalciferol , 50000 units CAPS    Sig: 1 q wed and 1 q sun    Dispense:  8 capsule    Refill:  1   buPROPion  (WELLBUTRIN  SR) 150 MG 12 hr tablet    Sig: 1 po q am and 1 po q late afternoon    Dispense:  180 tablet    Refill:  0    Ov for rf      Follow up:   Return 07/26/2024 2:00 PM.  She was informed of the importance of frequent follow up visits to maximize her success with intensive lifestyle modifications for her multiple health conditions.   Weight Summary and Biometrics   Weight Lost Since Last Visit: 0lb  Weight Gained Since Last Visit: 10lb    Vitals Temp: 98.2 F (36.8  C) BP: 135/81 Pulse Rate: (!) 130 SpO2: 98 %   Anthropometric Measurements Height: 5' 4 (1.626 m) Weight: 286 lb (129.7 kg) BMI (Calculated): 49.07 Weight at Last Visit: 276lb Weight Lost Since Last Visit: 0lb Weight Gained Since Last Visit: 10lb Starting Weight: 277lb Total Weight Loss (lbs): 0 lb (0 kg)   Body Composition  Body Fat %: 53.3 % Fat Mass (lbs): 152.4 lbs Muscle Mass (lbs): 126.8 lbs Total Body Water (lbs): 100.2 lbs Visceral Fat Rating : 16   Other Clinical Data Fasting: no Labs: no Today's Visit #: 54 Starting Date: 11/12/20    Objective:   PHYSICAL EXAM: Blood pressure 135/81, pulse (!) 130, temperature 98.2 F (36.8 C), height 5' 4 (1.626 m), weight 286 lb (129.7 kg), last menstrual period 06/14/2024, SpO2 98%. Body mass index is 49.09 kg/m.  General: she is overweight, cooperative and in no acute distress. PSYCH: Has normal mood, affect and thought process.   HEENT: EOMI, sclerae are anicteric. Lungs: Normal breathing effort, no conversational dyspnea. Extremities: Moves * 4 Neurologic: A and O * 3, good insight  DIAGNOSTIC DATA REVIEWED: BMET    Component Value Date/Time   NA 139 06/02/2023 1445   K 4.6 06/02/2023 1445   CL 106 06/02/2023 1445   CO2 20 06/02/2023 1445   GLUCOSE 87 06/02/2023 1445   GLUCOSE 117 (H) 09/21/2010 1808   BUN 11 06/02/2023 1445   CREATININE 0.84 06/02/2023 1445   CALCIUM 8.9 06/02/2023 1445   GFRNONAA NOT CALCULATED 09/21/2010 1808   GFRAA  09/21/2010 1808    NOT CALCULATED        The eGFR has been calculated using the MDRD equation. This calculation has not been validated in all clinical situations. eGFR's persistently <60 mL/min signify possible Chronic Kidney Disease.   Lab Results  Component Value Date   HGBA1C 5.2 03/05/2024   HGBA1C 5.4 11/12/2020   Lab Results  Component Value Date   INSULIN  14.0 11/24/2023   INSULIN  18.4 11/12/2020   Lab Results  Component Value Date   TSH  3.160 06/02/2023   CBC  Component Value Date/Time   WBC 8.4 04/28/2022 0849   WBC 18.1 (H) 09/21/2010 1808   RBC 4.66 04/28/2022 0849   RBC 4.87 09/21/2010 1808   HGB 12.8 04/28/2022 0849   HCT 39.4 04/28/2022 0849   PLT 266 04/28/2022 0849   MCV 85 04/28/2022 0849   MCH 27.5 04/28/2022 0849   MCH 28.5 09/21/2010 1808   MCHC 32.5 04/28/2022 0849   MCHC 35.0 09/21/2010 1808   RDW 13.3 04/28/2022 0849   Iron Studies No results found for: IRON, TIBC, FERRITIN, IRONPCTSAT Lipid Panel     Component Value Date/Time   CHOL 199 11/24/2023 1237   TRIG 157 (H) 11/24/2023 1237   HDL 68 11/24/2023 1237   CHOLHDL 2.9 11/24/2023 1237   LDLCALC 104 (H) 11/24/2023 1237   Hepatic Function Panel     Component Value Date/Time   PROT 6.9 06/02/2023 1445   ALBUMIN 4.0 06/02/2023 1445   AST 18 06/02/2023 1445   ALT 18 06/02/2023 1445   ALKPHOS 97 06/02/2023 1445   BILITOT <0.2 06/02/2023 1445      Component Value Date/Time   TSH 3.160 06/02/2023 1445   Nutritional Lab Results  Component Value Date   VD25OH 56.2 11/24/2023   VD25OH 56.7 06/02/2023   VD25OH 67.6 10/04/2022    Attestations:   I, Feliciano Mingle, acting as a stage manager for [employer], DO., have compiled all relevant documentation for today's office visit on behalf of Molly Jenkins, DO, while in the presence of [employer], DO.   I have reviewed the above documentation for accuracy and completeness, and I agree with the above. Molly JINNY Cherry, D.O.  The 21st Century Cures Act was signed into law in 2016 which includes the topic of electronic health records.  This provides immediate access to information in MyChart.  This includes consultation notes, operative notes, office notes, lab results and pathology reports.  If you have any questions about what you read please let us  know at your next visit so we can discuss your concerns and take corrective action if need be.  We are right here with  you.  "

## 2024-06-26 LAB — CBC WITH DIFFERENTIAL/PLATELET
Basophils Absolute: 0 x10E3/uL (ref 0.0–0.2)
Basos: 0 %
EOS (ABSOLUTE): 0.1 x10E3/uL (ref 0.0–0.4)
Eos: 1 %
Hematocrit: 38.8 % (ref 34.0–46.6)
Hemoglobin: 12.8 g/dL (ref 11.1–15.9)
Immature Grans (Abs): 0.1 x10E3/uL (ref 0.0–0.1)
Immature Granulocytes: 1 %
Lymphocytes Absolute: 2.9 x10E3/uL (ref 0.7–3.1)
Lymphs: 31 %
MCH: 27.9 pg (ref 26.6–33.0)
MCHC: 33 g/dL (ref 31.5–35.7)
MCV: 85 fL (ref 79–97)
Monocytes Absolute: 0.6 x10E3/uL (ref 0.1–0.9)
Monocytes: 7 %
Neutrophils Absolute: 5.8 x10E3/uL (ref 1.4–7.0)
Neutrophils: 60 %
Platelets: 279 x10E3/uL (ref 150–450)
RBC: 4.58 x10E6/uL (ref 3.77–5.28)
RDW: 13.1 % (ref 11.7–15.4)
WBC: 9.5 x10E3/uL (ref 3.4–10.8)

## 2024-06-26 LAB — HEMOGLOBIN A1C
Est. average glucose Bld gHb Est-mCnc: 114 mg/dL
Hgb A1c MFr Bld: 5.6 % (ref 4.8–5.6)

## 2024-06-26 LAB — COMPREHENSIVE METABOLIC PANEL WITH GFR
ALT: 24 IU/L (ref 0–32)
AST: 24 IU/L (ref 0–40)
Albumin: 3.8 g/dL — ABNORMAL LOW (ref 4.0–5.0)
Alkaline Phosphatase: 109 IU/L (ref 41–116)
BUN/Creatinine Ratio: 14 (ref 9–23)
BUN: 10 mg/dL (ref 6–20)
Bilirubin Total: <0.2 mg/dL (ref 0.0–1.2)
CO2: 21 mmol/L (ref 20–29)
Calcium: 8.9 mg/dL (ref 8.7–10.2)
Chloride: 104 mmol/L (ref 96–106)
Creatinine, Ser: 0.72 mg/dL (ref 0.57–1.00)
Globulin, Total: 3 g/dL (ref 1.5–4.5)
Glucose: 128 mg/dL — ABNORMAL HIGH (ref 70–99)
Potassium: 4.1 mmol/L (ref 3.5–5.2)
Sodium: 142 mmol/L (ref 134–144)
Total Protein: 6.8 g/dL (ref 6.0–8.5)
eGFR: 121 mL/min/1.73

## 2024-06-26 LAB — VITAMIN D 25 HYDROXY (VIT D DEFICIENCY, FRACTURES): Vit D, 25-Hydroxy: 35 ng/mL (ref 30.0–100.0)

## 2024-06-26 LAB — TSH: TSH: 1.77 u[IU]/mL (ref 0.450–4.500)

## 2024-06-26 LAB — VITAMIN B12: Vitamin B-12: 825 pg/mL (ref 232–1245)

## 2024-06-26 LAB — T4, FREE: Free T4: 1.11 ng/dL (ref 0.82–1.77)

## 2024-06-28 ENCOUNTER — Ambulatory Visit (INDEPENDENT_AMBULATORY_CARE_PROVIDER_SITE_OTHER): Payer: Self-pay | Admitting: Family Medicine

## 2024-06-28 NOTE — Telephone Encounter (Signed)
 PA questions for Lomaira  8MG   have been answered and all documentation has been included. Waiting on a determination.

## 2024-07-02 ENCOUNTER — Encounter (INDEPENDENT_AMBULATORY_CARE_PROVIDER_SITE_OTHER): Payer: Self-pay | Admitting: Family Medicine

## 2024-07-02 DIAGNOSIS — Z6841 Body Mass Index (BMI) 40.0 and over, adult: Secondary | ICD-10-CM

## 2024-07-02 NOTE — Telephone Encounter (Signed)
 PA for Lomaira  8mg  has been denied. PA is now complete.      Not a covered benefit.

## 2024-07-21 ENCOUNTER — Other Ambulatory Visit (INDEPENDENT_AMBULATORY_CARE_PROVIDER_SITE_OTHER): Payer: Self-pay | Admitting: Family Medicine

## 2024-07-21 DIAGNOSIS — G44229 Chronic tension-type headache, not intractable: Secondary | ICD-10-CM

## 2024-07-25 ENCOUNTER — Encounter (INDEPENDENT_AMBULATORY_CARE_PROVIDER_SITE_OTHER): Payer: Self-pay

## 2024-07-25 DIAGNOSIS — R030 Elevated blood-pressure reading, without diagnosis of hypertension: Secondary | ICD-10-CM | POA: Insufficient documentation

## 2024-07-26 ENCOUNTER — Encounter (INDEPENDENT_AMBULATORY_CARE_PROVIDER_SITE_OTHER): Payer: Self-pay | Admitting: Family Medicine

## 2024-07-26 ENCOUNTER — Ambulatory Visit (INDEPENDENT_AMBULATORY_CARE_PROVIDER_SITE_OTHER): Admitting: Family Medicine

## 2024-07-26 ENCOUNTER — Encounter (INDEPENDENT_AMBULATORY_CARE_PROVIDER_SITE_OTHER): Payer: Self-pay

## 2024-07-26 VITALS — BP 138/82 | HR 104 | Temp 98.3°F | Ht 64.0 in | Wt 287.0 lb

## 2024-07-26 DIAGNOSIS — R7303 Prediabetes: Secondary | ICD-10-CM

## 2024-07-26 DIAGNOSIS — E538 Deficiency of other specified B group vitamins: Secondary | ICD-10-CM

## 2024-07-26 DIAGNOSIS — G44229 Chronic tension-type headache, not intractable: Secondary | ICD-10-CM

## 2024-07-26 DIAGNOSIS — E559 Vitamin D deficiency, unspecified: Secondary | ICD-10-CM

## 2024-07-26 DIAGNOSIS — Z6841 Body Mass Index (BMI) 40.0 and over, adult: Secondary | ICD-10-CM

## 2024-07-26 DIAGNOSIS — F39 Unspecified mood [affective] disorder: Secondary | ICD-10-CM

## 2024-07-26 MED ORDER — PHENTERMINE HCL 8 MG PO TABS: 4.0000 mg | ORAL_TABLET | ORAL | 0 refills | Status: AC

## 2024-07-26 NOTE — Progress Notes (Incomplete)
 "  Molly Cherry, D.O.  ABFM, ABOM Specializing in Clinical Bariatric Medicine  Office located at: [address] [place], [place]  [address]     Medications Discontinued During This Encounter  Medication Reason   Phentermine  HCl 8 MG TABS Reorder     Meds ordered this encounter  Medications   Phentermine  HCl 8 MG TABS    Sig: Take 0.5 tablets (4 mg total) by mouth every morning.    Dispense:  15 tablet    Refill:  0      FOR THE CHRONIC DISEASE OF OBESITY:  Chief complaint: Obesity Molly Cherry is here to discuss her progress with her obesity treatment plan.   History of present illness / Interval history:  Molly Cherry is here today for her follow-up office visit.  Since last OV on 06/25/24 with provider: Dr.O, patient  fair adherence to reduced calorie nutritional plan.  Pt has been struggling with:  []  meal planning and prepping [x]  exercise []  sleep []  stressors []  mood []  chronic or acute medical conditions-  []  eating out more []  Nothing, doing great  Pt has been working on and improving their:  []  meal planning and prepping []  exercise []  sleep hygiene []  water intake [x]  strategies to better manage personal stressors [x]  strategies to better manage mood []  eating out less []  Nothing particular at this time  When asked by CMA prior to our office visit today, pt states they have been:  - Focused on eating fresh, unprocessed foods?  Yes   - Focused on eating lean proteins with each meal?  Yes   - Sleeping 7-9 Hours/ Night?  Yes   - Skipping Meals? Yes - States she is following her healthy eating plan approximately 70 % of the time.   Recent weight loss data history  06/25/24 14:00 07/26/24 13:00   Body Fat % 53.3 % 53.1 %  Muscle Mass (lbs) 126.8 lbs 128.2 lbs  Fat Mass (lbs) 152.4 lbs 152.8 lbs  Total Body Water (lbs) 100.2 lbs 100 lbs  Visceral Fat Rating  16 16   Counseling done on how various foods/ behaviors will affect these numbers  and how to maximize weight loss success discussed today in detail based on these findings. Total lbs lost to date: + 10 lbs Total Fat Mass in lbs lost to date: +11.6 lbs Total weight loss percentage to date since starting program:  +3.61 %    BMI 45.0-49.9, adult ([name]) Start BMI 47.55 Class 3 severe obesity-Current BMI 49.24  Physician directed Nutrition Therapy prescription: She is on keeping a food journal and adhering to recommended goals of 1200-1400 calories and 85+ protein.    Molly Cherry is currently in the action stage of change. As such, her goal is to begin journal accurately.   She has agreed to: Journal 1600-1700 Calories with 85+ g protein    Physician directed Behavioral Modification prescription: Evidence-based interventions for healthy behavior change were utilized today including the discussion of small changes and SMART goals.  barriers to successful adherence to behavorial change for wt loss: dealing with depression / mood disorders  We discussed the following today: increasing lean protein intake to established goals, avoiding skipping meals, keeping healthy foods at home, planning for success, and better snacking choices    Physician directed Physical Activity prescription: Molly Cherry is doing exercise videos 20-30  minutes 3 days per week barriers to successful adherence to exercise for wt loss: Weather  Molly Cherry has been educated on importance  of strength training to enhance/ sustain muscle mass along with prudent nutrition and improves insulin  sensitivity and helps the body use the food you eat for energy rather than storing it as fat  Exercise prescription: She has agreed to Combine aerobic and strengthening exercises for efficiency and improved cardiometabolic health.    Medical Interventions/ Pharmacotherapy Previous Bariatric surgery: Referral to [facility] was sent and she is in the process of getting all the paper work done.    Previously  tried medical weight loss medications/ therapies: None  Pharmacotherapy for weight loss: She is currently taking Wellbutrin  SR 150 mg twice daily, Metformin  500  mg twice daily, Phentermine  4 mg daily, and Topiramate  50 mg BID for medical weight loss.     We discussed various medication options to help Molly Cherry with her weight loss efforts and we both agreed to:  Adequate clinical response to anti-obesity medication, continue current anti-obesity regimen  SPECIFIC behavorial / nutritional / exercise goals for next office visit:   Journal consistently  B) OBESITY RELATED CONDITIONS ADDRESSED TODAY:  Prediabetes Assessment & Plan Lab Results  Component Value Date   HGBA1C 5.6 06/25/2024   HGBA1C 5.2 03/05/2024   HGBA1C 5.4 11/24/2023   INSULIN  14.0 11/24/2023   INSULIN  26.3 (H) 06/02/2023   INSULIN  22.1 10/04/2022      Component Value Date/Time   NA 142 06/25/2024 1520   K 4.1 06/25/2024 1520   CL 104 06/25/2024 1520   CO2 21 06/25/2024 1520   GLUCOSE 128 (H) 06/25/2024 1520   GLUCOSE 117 (H) 09/21/2010 1808   BUN 10 06/25/2024 1520   CREATININE 0.72 06/25/2024 1520   CALCIUM 8.9 06/25/2024 1520   PROT 6.8 06/25/2024 1520   ALBUMIN 3.8 (L) 06/25/2024 1520   AST 24 06/25/2024 1520   ALT 24 06/25/2024 1520   ALKPHOS 109 06/25/2024 1520   BILITOT <0.2 06/25/2024 1520   EGFR 121 06/25/2024 1520   GFRNONAA NOT CALCULATED 09/21/2010 1808   Lab Results  Component Value Date   TSH 1.770 06/25/2024   T3TOTAL 158 11/12/2020   FREET4 1.11 06/25/2024      Component Value Date/Time   WBC 9.5 06/25/2024 1520   WBC 18.1 (H) 09/21/2010 1808   RBC 4.58 06/25/2024 1520   RBC 4.87 09/21/2010 1808   HGB 12.8 06/25/2024 1520   HCT 38.8 06/25/2024 1520   PLT 279 06/25/2024 1520   MCV 85 06/25/2024 1520   MCH 27.9 06/25/2024 1520   MCH 28.5 09/21/2010 1808   MCHC 33.0 06/25/2024 1520   MCHC 35.0 09/21/2010 1808   RDW 13.1 06/25/2024 1520   LYMPHSABS 2.9 06/25/2024 1520    MONOABS 1.1 09/21/2010 1808   EOSABS 0.1 06/25/2024 1520   BASOSABS 0.0 06/25/2024 1520   On Metformin  500 mg BID with reported good compliance and tolerance. Patient denies any GI upsets. Patients A1C has increased from 5.2 to 5.6. Explained how this could be due to eating simple carbs and increasing in fat mass. Patient endorses journaling once or twice a week and averaged 3300 calories with about 80 g of protein. She states that this is on a day that she was feeling snacky and endorses skipping breakfast. Continue following prudent meal plan and decreasing simple carbs and sugars.   Fasting sugars are slightly elevated but this could be due to what she ate the night before. Low albumin could indicate low protein. Kidney function is at goal. Thyroid levels is WNL. No acute concerns.  Mood disorder ([name])- Emotional eating Assessment & Plan On Wellbutrin  SR 150 mg BID and Phentermine  4 mg daily with reported good compliance and tolerance. Patient endorses having high calorie days due to now being at home or skipping a meal usually breakfast. Patient today became visibly upset about situations that happen at work and it causes her to comfort herself with food.  She is struggling with dealing with people judging her for her weight and looks. Recommended that patient seek a counselor to talk about emotional eating and build strategies. Discussed with patient different ways to help manage stressors. Explained to patient that we can increase Wellbutrin  to also help manage stressors. Risks and benefits explained to patient about increasing dosage, patient wishes to hold off and will look into finding a counselor. Stressed to patient that she should start journaling 10 minutes every day and look into what mental health resources are covered by her insurance. Refilled phentermine  and checked PDMP no discrepancies found. Patient states that she has notices some reduced hunger with medication. Will reassess at  next OV.     Vitamin D  deficiency Assessment & Plan Lab Results  Component Value Date   VD25OH 35.0 06/25/2024   VD25OH 56.2 11/24/2023   VD25OH 56.7 06/02/2023   Patient states that she has not been taking her Vit D supplement for the whole month of December. Vit D levels are not at goal.Goal levels are between 50 and 70. She states that she thinks her supplementation slid to the back of the cupboard and she was unaware. Recommended that patient restart supplementation. Stressed to patient the importance of having at goal Vit D levels. Restart ERGO 50K units twice weekly.     B12 deficiency Assessment & Plan Lab Results  Component Value Date   VITAMINB12 825 06/25/2024   FOLATE 5.6 11/12/2020   Taking Vit B12 500 mcg daily with reported good compliance and tolerance. Vit B12 levels are at goal. Reviewed with patient the importance of having at goal B12 levels. Continue supplementation. Answered any pertinent questions. Will reassess at next OV.     Chronic tension-type headache, not intractable Assessment & Plan On Topiramate  50 mg BID with reported good compliance. Patient states that she has good control of symptoms. No concerns today. Reminded patient of risks and benefits. Continue with medications. Will reassess at next OV.     Follow up:   Return 08/23/2024 at 7:40 AM   She was informed of the importance of frequent follow up visits to maximize her success with intensive lifestyle modifications for her multiple health conditions.   Weight Summary and Biometrics   Weight Lost Since Last Visit: 0lb  Weight Gained Since Last Visit: 1lb   Vitals Temp: 98.3 F (36.8 C) BP: 138/82 Pulse Rate: (!) 104 SpO2: 98 %   Anthropometric Measurements Height: 5' 4 (1.626 m) Weight: 287 lb (130.2 kg) BMI (Calculated): 49.24 Weight at Last Visit: 286lb Weight Lost Since Last Visit: 0lb Weight Gained Since Last Visit: 1lb Starting Weight: 277lb Total Weight Loss (lbs):  0 lb (0 kg)   Body Composition  Body Fat %: 53.1 % Fat Mass (lbs): 152.8 lbs Muscle Mass (lbs): 128.2 lbs Total Body Water (lbs): 100 lbs Visceral Fat Rating : 16   Other Clinical Data Fasting: no Labs: no Today's Visit #: 55 Starting Date: 11/12/20    Objective:   PHYSICAL EXAM: Blood pressure 138/82, pulse (!) 104, temperature 98.3 F (36.8 C), height 5' 4 (1.626 m), weight 287 lb (  130.2 kg), last menstrual period 06/14/2024, SpO2 98%. Body mass index is 49.26 kg/m. General: she is overweight, cooperative and in no acute distress. PSYCH: Has normal mood, affect and thought process.   HEENT: EOMI, sclerae are anicteric. Lungs: Normal breathing effort, no conversational dyspnea. Extremities: Moves * 4 Neurologic: A and O * 3, good insight  DIAGNOSTIC DATA REVIEWED: BMET    Component Value Date/Time   NA 142 06/25/2024 1520   K 4.1 06/25/2024 1520   CL 104 06/25/2024 1520   CO2 21 06/25/2024 1520   GLUCOSE 128 (H) 06/25/2024 1520   GLUCOSE 117 (H) 09/21/2010 1808   BUN 10 06/25/2024 1520   CREATININE 0.72 06/25/2024 1520   CALCIUM 8.9 06/25/2024 1520   GFRNONAA NOT CALCULATED 09/21/2010 1808   GFRAA  09/21/2010 1808    NOT CALCULATED        The eGFR has been calculated using the MDRD equation. This calculation has not been validated in all clinical situations. eGFR's persistently <60 mL/min signify possible Chronic Kidney Disease.   Lab Results  Component Value Date   HGBA1C 5.6 06/25/2024   HGBA1C 5.4 11/12/2020   Lab Results  Component Value Date   INSULIN  14.0 11/24/2023   INSULIN  18.4 11/12/2020   Lab Results  Component Value Date   TSH 1.770 06/25/2024   CBC    Component Value Date/Time   WBC 9.5 06/25/2024 1520   WBC 18.1 (H) 09/21/2010 1808   RBC 4.58 06/25/2024 1520   RBC 4.87 09/21/2010 1808   HGB 12.8 06/25/2024 1520   HCT 38.8 06/25/2024 1520   PLT 279 06/25/2024 1520   MCV 85 06/25/2024 1520   MCH 27.9 06/25/2024 1520    MCH 28.5 09/21/2010 1808   MCHC 33.0 06/25/2024 1520   MCHC 35.0 09/21/2010 1808   RDW 13.1 06/25/2024 1520   Iron Studies No results found for: IRON, TIBC, FERRITIN, IRONPCTSAT Lipid Panel     Component Value Date/Time   CHOL 199 11/24/2023 1237   TRIG 157 (H) 11/24/2023 1237   HDL 68 11/24/2023 1237   CHOLHDL 2.9 11/24/2023 1237   LDLCALC 104 (H) 11/24/2023 1237   Hepatic Function Panel     Component Value Date/Time   PROT 6.8 06/25/2024 1520   ALBUMIN 3.8 (L) 06/25/2024 1520   AST 24 06/25/2024 1520   ALT 24 06/25/2024 1520   ALKPHOS 109 06/25/2024 1520   BILITOT <0.2 06/25/2024 1520      Component Value Date/Time   TSH 1.770 06/25/2024 1520   Nutritional Lab Results  Component Value Date   VD25OH 35.0 06/25/2024   VD25OH 56.2 11/24/2023   VD25OH 56.7 06/02/2023    Attestations:   I, Sonny Laroche, acting as a stage manager for Molly Jenkins, DO., have compiled all relevant documentation for today's office visit on behalf of Molly Jenkins, DO, while in the presence of [employer], DO.  I have spent 53 minutes in the care of the patient including:   -   1 minutes before the visit reviewing and preparing the chart.   -   43 minutes face-to-face assessing and reviewing listed medical problems as outlined in obesity care plan, providing nutritional and behavioral counseling on topics outlined in the obesity care plan, independently interpreting test results and goals of care as described in assessment and plan, and reviewing and discussing biometric information and progress with obesity treatment plan  -   9 minutes after the visit regarding the EMR documentation of encounter.  I have reviewed the above documentation for accuracy and completeness, and I agree with the above. Molly JINNY Cherry, D.O.  The 21st Century Cures Act was signed into law in 2016 which includes the topic of electronic health records.  This provides immediate access to  information in MyChart.  This includes consultation notes, operative notes, office notes, lab results and pathology reports.  If you have any questions about what you read please let us  know at your next visit so we can discuss your concerns and take corrective action if need be.  We are right here with you.  "

## 2024-08-23 ENCOUNTER — Ambulatory Visit (INDEPENDENT_AMBULATORY_CARE_PROVIDER_SITE_OTHER): Admitting: Family Medicine

## 2024-08-30 ENCOUNTER — Encounter

## 2024-09-20 ENCOUNTER — Ambulatory Visit (INDEPENDENT_AMBULATORY_CARE_PROVIDER_SITE_OTHER): Admitting: Family Medicine
